# Patient Record
Sex: Male | Born: 1960
Health system: Southern US, Community
[De-identification: ages and names within clinical notes are randomized; demographics above are authoritative.]

## PROBLEM LIST (undated history)

## (undated) DIAGNOSIS — I471 Supraventricular tachycardia: Secondary | ICD-10-CM

## (undated) DIAGNOSIS — Z9889 Other specified postprocedural states: Secondary | ICD-10-CM

## (undated) DIAGNOSIS — D6851 Activated protein C resistance: Secondary | ICD-10-CM

## (undated) DIAGNOSIS — K649 Unspecified hemorrhoids: Secondary | ICD-10-CM

## (undated) DIAGNOSIS — I10 Essential (primary) hypertension: Secondary | ICD-10-CM

## (undated) DIAGNOSIS — C801 Malignant (primary) neoplasm, unspecified: Secondary | ICD-10-CM

## (undated) DIAGNOSIS — I499 Cardiac arrhythmia, unspecified: Secondary | ICD-10-CM

## (undated) DIAGNOSIS — K635 Polyp of colon: Secondary | ICD-10-CM

## (undated) DIAGNOSIS — E785 Hyperlipidemia, unspecified: Secondary | ICD-10-CM

## (undated) DIAGNOSIS — I82409 Acute embolism and thrombosis of unspecified deep veins of unspecified lower extremity: Secondary | ICD-10-CM

## (undated) DIAGNOSIS — I4719 Other supraventricular tachycardia: Secondary | ICD-10-CM

## (undated) DIAGNOSIS — G4730 Sleep apnea, unspecified: Secondary | ICD-10-CM

## (undated) DIAGNOSIS — R9430 Abnormal result of cardiovascular function study, unspecified: Secondary | ICD-10-CM

## (undated) DIAGNOSIS — K219 Gastro-esophageal reflux disease without esophagitis: Secondary | ICD-10-CM

## (undated) DIAGNOSIS — N189 Chronic kidney disease, unspecified: Secondary | ICD-10-CM

## (undated) DIAGNOSIS — D759 Disease of blood and blood-forming organs, unspecified: Secondary | ICD-10-CM

## (undated) DIAGNOSIS — IMO0002 Reserved for concepts with insufficient information to code with codable children: Secondary | ICD-10-CM

## (undated) DIAGNOSIS — R112 Nausea with vomiting, unspecified: Secondary | ICD-10-CM

## (undated) HISTORY — DX: Activated protein C resistance: D68.51

## (undated) HISTORY — PX: NOSE SURGERY: SHX723

## (undated) HISTORY — PX: NECK SURGERY: SHX720

## (undated) HISTORY — PX: KNEE ARTHROSCOPY: SHX127

## (undated) HISTORY — PX: VASECTOMY: SHX75

## (undated) HISTORY — DX: Hyperlipidemia, unspecified: E78.5

## (undated) HISTORY — PX: BICEPS TENDON REPAIR: SHX566

## (undated) HISTORY — DX: Polyp of colon: K63.5

## (undated) HISTORY — PX: SHOULDER SURGERY: SHX246

## (undated) HISTORY — DX: Other supraventricular tachycardia: I47.19

## (undated) HISTORY — PX: FRACTURE SURGERY: SHX138

## (undated) HISTORY — DX: Reserved for concepts with insufficient information to code with codable children: IMO0002

## (undated) HISTORY — DX: Abnormal result of cardiovascular function study, unspecified: R94.30

## (undated) HISTORY — DX: Unspecified hemorrhoids: K64.9

## (undated) HISTORY — PX: EYE SURGERY: SHX253

## (undated) HISTORY — DX: Essential (primary) hypertension: I10

## (undated) HISTORY — DX: Supraventricular tachycardia: I47.1

---

## 1993-04-28 HISTORY — PX: HEMORROIDECTOMY: SUR656

## 2002-05-03 ENCOUNTER — Encounter: Payer: Self-pay | Admitting: Family Medicine

## 2002-05-03 ENCOUNTER — Encounter: Admission: RE | Admit: 2002-05-03 | Discharge: 2002-05-03 | Payer: Self-pay | Admitting: Family Medicine

## 2004-04-23 ENCOUNTER — Ambulatory Visit (HOSPITAL_COMMUNITY): Admission: RE | Admit: 2004-04-23 | Discharge: 2004-04-23 | Payer: Self-pay | Admitting: Orthopedic Surgery

## 2004-04-23 ENCOUNTER — Ambulatory Visit (HOSPITAL_BASED_OUTPATIENT_CLINIC_OR_DEPARTMENT_OTHER): Admission: RE | Admit: 2004-04-23 | Discharge: 2004-04-23 | Payer: Self-pay | Admitting: Orthopedic Surgery

## 2005-12-04 ENCOUNTER — Encounter: Payer: Self-pay | Admitting: Vascular Surgery

## 2005-12-04 ENCOUNTER — Ambulatory Visit (HOSPITAL_COMMUNITY): Admission: RE | Admit: 2005-12-04 | Discharge: 2005-12-04 | Payer: Self-pay | Admitting: Internal Medicine

## 2005-12-24 ENCOUNTER — Ambulatory Visit: Payer: Self-pay | Admitting: Oncology

## 2006-01-30 LAB — BETA-2 GLYCOPROTEIN ANTIBODIES: Beta-2-Glycoprotein I IgA: <4 U/mL (ref <10)

## 2006-01-30 LAB — LUPUS ANTICOAGULANT PANEL
DRVVT: 66.8 secs — ABNORMAL HIGH (ref 31.9–44.2)
PTT Lupus Anticoagulant: 60.7 secs — ABNORMAL HIGH (ref 36.3–48.8)

## 2006-01-30 LAB — CARDIOLIPIN ANTIBODIES, IGG, IGM, IGA
Anticardiolipin IgA: 7 [APL'U] (ref <13)
Anticardiolipin IgM: 9 [MPL'U] (ref <10)

## 2008-11-04 ENCOUNTER — Emergency Department (HOSPITAL_COMMUNITY): Admission: EM | Admit: 2008-11-04 | Discharge: 2008-11-04 | Payer: Self-pay | Admitting: Emergency Medicine

## 2008-11-09 ENCOUNTER — Ambulatory Visit (HOSPITAL_COMMUNITY): Admission: RE | Admit: 2008-11-09 | Discharge: 2008-11-10 | Payer: Self-pay | Admitting: Orthopedic Surgery

## 2010-04-10 ENCOUNTER — Ambulatory Visit
Admission: RE | Admit: 2010-04-10 | Discharge: 2010-04-10 | Payer: Self-pay | Source: Home / Self Care | Attending: Orthopedic Surgery | Admitting: Orthopedic Surgery

## 2010-08-04 LAB — BASIC METABOLIC PANEL
BUN: 16 mg/dL (ref 6–23)
CO2: 27 mEq/L (ref 19–32)
Chloride: 104 mEq/L (ref 96–112)
Creatinine, Ser: 0.93 mg/dL (ref 0.4–1.5)

## 2010-08-04 LAB — CBC
HCT: 43.6 % (ref 39.0–52.0)
HCT: 45 % (ref 39.0–52.0)
Hemoglobin: 14.6 g/dL (ref 13.0–17.0)
MCHC: 33.4 g/dL (ref 30.0–36.0)
MCHC: 33.8 g/dL (ref 30.0–36.0)
MCV: 89.5 fL (ref 78.0–100.0)
Platelets: 142 10*3/uL — ABNORMAL LOW (ref 150–400)
RBC: 5.02 MIL/uL (ref 4.22–5.81)
RDW: 13.4 % (ref 11.5–15.5)

## 2010-08-04 LAB — PROTIME-INR: Prothrombin Time: 13.4 seconds (ref 11.6–15.2)
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
## 2010-09-10 NOTE — Op Note (Signed)
Steven Conley            ACCOUNT NO.:  [id number]   MEDICAL RECORD NO.:  [id number]          PATIENT TYPE:  OIB   LOCATION:  5037                         FACILITY:  [facility]   PHYSICIAN:  Burnard Bunting, M.D.    DATE OF BIRTH:  1961/04/16   DATE OF PROCEDURE:  11/09/2008  DATE OF DISCHARGE:                               OPERATIVE REPORT   PREOPERATIVE DIAGNOSIS:  Right distal biceps rupture.   POSTOPERATIVE DIAGNOSIS:  Right distal biceps rupture.   PROCEDURE:  Right distal biceps rupture repair.   SURGEON:  Burnard Bunting, M.D.  Assist - Vear Clock MD   ANESTHESIA:  General endotracheal.   ESTIMATED BLOOD LOSS:  25 mL.   TOURNIQUET TIME:  19 minutes at 300 mmHg.   INDICATIONS:  Steven Conley is a 50 year old patient with right  distal biceps rupture who presents for operative management after  explanation of risks and benefits.   PROCEDURE IN DETAIL:  The patient was brought to the operative room  where general endotracheal anesthesia was induced.  Perioperative  antibiotics were administered.  Right arm and elbow were prepped and  prescrubbed with chlorhexidine, alcohol, and Betadine, which allowed to  air dry then prepped in DuraPrep solution.  Steven Conley was used to cover the  operative field.  Sterile tourniquet was utilized.  Arm was elevated and  exsanguinated with Esmarch wrap.  Tourniquet was inflated.  Within the  elbow flexion crease, skin and subcutaneous tissues were sharply  divided.  The biceps tendon was visualized.  Care was taken to avoid  injury to the internal branches with musculocutaneous nerve.  The distal  tip of the tendon was trimmed 2-3 mm of tissue.  Following that, two #2  FiberWire sutures were placed and grasped in modified Kessler fashion  with the 4 strands exiting the tip.  At this time, the tunnel on the  biceps tendon was palpated and enlarged manually.  The hand was  pronated, supinated, and tuberosity was palpated.  At this time,  a  curved hemostat was placed around the tuberosity.  The incision was made  avoiding any contacts for visualization with the awl.  The tip was then  pulled through the skin.  Vessel loop was then placed and pulled back up  to the anterior incision.  The skin and subcutaneous tissue, muscle and  fascia was then bluntly divided down until the tuberosity was  visualized.  Care was taken to avoid injury and to avoid excessive  retraction around posterior interosseous nerve.  At this time, using an  osteotome via tuberosity cortex was removed and a trough was created at  both upper curettes.  Thorough irrigation was then performed and removed  bone fragments.  CurvTek was then utilized to drill 2 holes in the  trough exiting the anterior cortex of the radial shaft.  Sutures were  then placed through these holes.  Two strands through each hole.  Sutures were then tied and the tendon pulled nicely into the trough  created on the radial tuberosity.  At this time, the incision was then  thoroughly irrigated with 2 liters  of irrigating solution.  Tourniquet  was released.  Bleeding points were encountered and controlled with  electrocautery.  The patient had excellent elbow range of motion with  appropriate tensioning on the biceps tendon.  At this time, all  incisions were thoroughly irrigated and closed using interrupted #2-0  Vicryl sutures, 3-0 Vicryl, and 3-0 Prolene suture.  Bleeding points  were controlled using electrocautery prior to closure.  Bulky  dressing, splint and brace locked from 90 degrees to full flexion was  placed.  Dr.  Lenny Pastel assistance was required all the time during the  case for retraction of important neurovascular structures and wound  positioning.  His assistance was a medical necessity.      Burnard Bunting, M.D.  Electronically Signed     GSD/MEDQ  D:  11/09/2008  T:  11/10/2008  Job:  [id number]

## 2010-09-13 NOTE — Op Note (Signed)
NAMEESTEFANO, Steven Conley            ACCOUNT NO.:  [id number]   MEDICAL RECORD NO.:  [id number]          PATIENT TYPE:  AMB   LOCATION:  DSC                          FACILITY:  [facility]   PHYSICIAN:  Robert A. Thurston Hole, M.D. DATE OF BIRTH:  08-Sep-1960   DATE OF PROCEDURE:  04/23/2004  DATE OF DISCHARGE:                                 OPERATIVE REPORT   PREOPERATIVE DIAGNOSIS:  Right knee medial meniscal tear with synovitis.   POSTOPERATIVE DIAGNOSIS:  Right knee medial meniscal tear with synovitis.   OPERATION PERFORMED:  1.  Right knee examination under anesthesia followed by arthroscopic partial      medial meniscectomy.  2.  Right knee partial synovectomy.   SURGEON:  Elana Alm. Thurston Hole, M.D.   ANESTHESIA:  General.   OPERATIVE TIME:  30 minutes.   COMPLICATIONS:  None.   INDICATIONS FOR PROCEDURE:  Mr. Volkman is a 50 year old gentleman who  injured his right knee approximately two and a half to three months ago with  a twisting type injury.  Exam and MRI has revealed a medial meniscal tear  and he has failed conservative care and is now to undergo arthroscopy.   DESCRIPTION OF PROCEDURE:  Mr. Wardlow was brought to the operating room on  April 23, 2004 and placed on the operating table in supine position.  After an adequate level of general anesthesia was obtained, his right knee  was examined.  He had full range of motion.  His knee was stable to  ligamentous exam with normal patellar tracking.  The knee was sterilely  injected with 0.25% Marcaine with epinephrine.  His right leg was then  prepped using sterile DuraPrep and draped using sterile technique.  Originally, through an anterolateral portal, the arthroscope with a pump  attached was placed. Through an anteromedial portal and arthroscopic probe  was placed.  On initial inspection of the medial compartment, he had mild  grade 1 and 2 chondromalacia.  Medial meniscus had tearing of the posterior  and medial horn  of which 50% was resected back to a stable rim.  The  intercondylar notch was inspected.  The anterior and posterior cruciate  ligaments were normal.  The lateral compartment showed mild grade 1 and 2  chondromalacia.  The lateral meniscus was normal.  The patellofemoral joint  articular cartilage was normal.  The patella tracked normally.  Medial and  lateral gutter showed moderate synovitis which was debrided.  Otherwise,  they were free of pathology.  After this was done, it was felt that all  pathology had been satisfactorily addressed.  The instruments were removed.  Portals were closed with 3-0 nylon suture and injected with 0.25% Marcaine  with epinephrine and 4 mg of morphine.  Sterile dressings were applied and  the patient awakened and taken to recovery room in stable condition.   FOLLOW UP:  Mr. Cosman will be followed as an outpatient on Percocet and  Naprosyn.  See him back in the office in a week for suture removal and  follow-up.       RAW/MEDQ  D:  04/23/2004  T:  04/23/2004  Job:  [id number]

## 2011-01-27 ENCOUNTER — Ambulatory Visit (INDEPENDENT_AMBULATORY_CARE_PROVIDER_SITE_OTHER): Payer: BC Managed Care – PPO | Admitting: General Surgery

## 2011-01-27 ENCOUNTER — Encounter (INDEPENDENT_AMBULATORY_CARE_PROVIDER_SITE_OTHER): Payer: Self-pay | Admitting: General Surgery

## 2011-01-27 VITALS — BP 146/84 | HR 68 | Temp 97.8°F | Resp 16 | Ht 69.5 in | Wt 201.6 lb

## 2011-01-27 DIAGNOSIS — K648 Other hemorrhoids: Secondary | ICD-10-CM

## 2011-01-27 NOTE — Progress Notes (Signed)
Subjective:     Patient ID: Steven Conley, male   DOB: [birth year], 50 y.o.   MRN: [id number]  HPI He has a history of what sounds like a 3 column hemorrhoidectomy for grade 3 hemorrhoids in 1995. He has had some recurrence of some of the symptoms with mostly just some soreness and discomfort around his anus when he is having a bowel movement. He notes some occasional bright red blood per rectum also. He is recently been seen by Dr. Conni Elliot. He has undergone a colonoscopy in August of 2012 which showed some external hemorrhoids a sessile polyp in the sigmoid colon but was otherwise normal at that point. He also went in EGD at that same time which was fairly normal as well. His pathology showed no evidence of any Barrett's on his EGD and a tubular adenoma in the sigmoid colon. He would like to have his hemorrhoids evaluated at this point is due to the fact that he had a lot of trouble before and wants to make sure that he doesn't get to that point again. He does take chronic warfarin for a history of DVT and factor V Leiden deficiency.  Review of Systems  HENT: Positive for sore throat.   Cardiovascular: Positive for leg swelling (secondary to prior dvt).  Gastrointestinal: Positive for diarrhea, anal bleeding and rectal pain.  All other systems reviewed and are negative.       Objective:   Physical Exam  Constitutional: He appears well-developed and well-nourished.  Genitourinary: Rectal exam shows internal hemorrhoid (left lateral enlarged internal hemorrhoid complex). Rectal exam shows no external hemorrhoid and no fissure. Prostate is not enlarged and not tender.       Assessment:     Internal hemorrhoids    Plan:        We discussed conservative measures for hemorrhoids today. He does have what sounds like some irritable bowel symptoms. I again stressed that he should be evaluated for Dr. Dulce Sellar about that. I think that these will likely recurred mostly symptoms given her little bit  better control. We did proceed with an anoscopy today due to his symptoms. He really had one hemorrhoidal complex in the left lateral position that was causing him trouble. I did apply 3 bands to this hemorrhoidal complex and it looked like this obliterated the complex. He had not taken his warfarin in 48 hours and there really was no bleeding during the procedure all either. I believe this will take care of the one symptomatic hemorrhoid that he has and we can just proceed with conservative measures. I asked him to followup with me in 6 weeks but if he is fine then can follow up with Dr. Dulce Sellar.

## 2011-03-03 ENCOUNTER — Encounter (INDEPENDENT_AMBULATORY_CARE_PROVIDER_SITE_OTHER): Payer: BC Managed Care – PPO | Admitting: General Surgery

## 2011-03-10 ENCOUNTER — Encounter (INDEPENDENT_AMBULATORY_CARE_PROVIDER_SITE_OTHER): Payer: BC Managed Care – PPO | Admitting: General Surgery

## 2011-03-24 ENCOUNTER — Encounter (INDEPENDENT_AMBULATORY_CARE_PROVIDER_SITE_OTHER): Payer: Self-pay | Admitting: General Surgery

## 2011-03-24 ENCOUNTER — Ambulatory Visit (INDEPENDENT_AMBULATORY_CARE_PROVIDER_SITE_OTHER): Payer: BC Managed Care – PPO | Admitting: General Surgery

## 2011-03-24 VITALS — BP 130/88 | HR 64 | Temp 98.2°F | Resp 18 | Ht 69.5 in | Wt 199.5 lb

## 2011-03-24 DIAGNOSIS — K648 Other hemorrhoids: Secondary | ICD-10-CM

## 2011-03-24 NOTE — Progress Notes (Signed)
Subjective:     Patient ID: Steven Conley, male   DOB: [birth year], 50 y.o.   MRN: [id number]  HPI 41 yom with history of internal hemorrhoids s/p bands about 8 weeks ago now. His symptoms are improved now but he is still having some occasional BRBPR.  He is otherwise without complaint today.  Review of Systems     Objective:   Physical Exam Small external tags, anoscopy with small left lateral hemorrhoids s/p banding    Assessment:     Internal hemorrhoids     Plan:     We discussed anoscopy again due to some continued symptoms.  He had some mild internal hemorrhoids that did bleed on evaluation today.  I applied two bands again today.  We had discussed risk/benefits again prior to beginning.  This was without complication.  We also discussed continuing water intake, fiber and toilet habits.  We may need to do again but he will call me if he continues to have symptoms or has any worsening.

## 2011-03-24 NOTE — Patient Instructions (Signed)
Hemorrhoid Banding Hemorrhoids are veins in the anus and lower rectum that become enlarged. The most common symptoms are rectal bleeding, itching, and sometimes pain. Hemorrhoids might come out with straining or having a bowel movement, and they can sometimes be pushed back in. There are internal and external hemorrhoids. Only internal hemorrhoids can be treated with banding. In this procedure, a rubber band is placed near the hemorrhoid tissue, cutting off the blood supply. This procedure prevents the hemorrhoids from slipping down. LET YOUR CAREGIVER KNOW ABOUT: All medicines you are taking, especially blood thinners such as aspirin and coumadin.  RISKS AND COMPLICATIONS This is not a painful procedure, but if you do have intense pain immediately let your surgeon know because the band may need to be removed. You may have some mild pain or discomfort in the first 2 days or so after treatment. Sometimes there may be delayed bleeding in the first week after treatment.  BEFORE THE PROCEDURE  There is no special preparation needed before banding. Your surgeon may have you do an enema prior to the procedure. You will go home the same day.  HOME CARE INSTRUCTIONS   Your surgeon might instruct you to do sitz baths as needed if you have discomfort or after a bowel movement.   You may be instructed to use fiber supplements.  SEEK MEDICAL CARE IF:  You have an increase in pain.   Your pain does not get better.  SEEK IMMEDIATE MEDICAL CARE IF:  You have intense pain.   Fever greater than 100.5 F (38.1 C).   Bleeding that does not stop, or pus from the anus.  Document Released: 02/09/2009 Document Revised: 12/25/2010 Document Reviewed: 02/09/2009 [employer] Patient Information 2012 [employer]. 

## 2011-11-12 ENCOUNTER — Other Ambulatory Visit: Payer: Self-pay | Admitting: Family Medicine

## 2011-11-12 DIAGNOSIS — M545 Low back pain, unspecified: Secondary | ICD-10-CM

## 2011-11-21 ENCOUNTER — Ambulatory Visit
Admission: RE | Admit: 2011-11-21 | Discharge: 2011-11-21 | Disposition: A | Discharge status: Home / Self Care | Payer: BC Managed Care – PPO | Source: Ambulatory Visit | Attending: Family Medicine | Admitting: Family Medicine

## 2011-11-21 DIAGNOSIS — M545 Low back pain, unspecified: Secondary | ICD-10-CM

## 2012-02-06 ENCOUNTER — Other Ambulatory Visit: Payer: Self-pay | Admitting: Family Medicine

## 2012-02-06 DIAGNOSIS — M542 Cervicalgia: Secondary | ICD-10-CM

## 2012-02-06 DIAGNOSIS — M549 Dorsalgia, unspecified: Secondary | ICD-10-CM

## 2012-02-17 ENCOUNTER — Ambulatory Visit
Admission: RE | Admit: 2012-02-17 | Discharge: 2012-02-17 | Disposition: A | Discharge status: Home / Self Care | Payer: BC Managed Care – PPO | Source: Ambulatory Visit | Attending: Family Medicine | Admitting: Family Medicine

## 2012-02-17 DIAGNOSIS — M549 Dorsalgia, unspecified: Secondary | ICD-10-CM

## 2012-02-17 DIAGNOSIS — M542 Cervicalgia: Secondary | ICD-10-CM

## 2012-03-09 ENCOUNTER — Encounter (HOSPITAL_COMMUNITY): Payer: Self-pay | Admitting: *Deleted

## 2012-03-09 ENCOUNTER — Encounter (HOSPITAL_COMMUNITY): Payer: Self-pay | Admitting: Pharmacy Technician

## 2012-03-09 NOTE — Progress Notes (Signed)
Pt. Instructed with teach back method used.

## 2012-03-10 ENCOUNTER — Ambulatory Visit (HOSPITAL_COMMUNITY)
Admission: RE | Admit: 2012-03-10 | Discharge: 2012-03-10 | Disposition: A | Discharge status: Home / Self Care | Payer: BC Managed Care – PPO | Source: Ambulatory Visit | Attending: Gastroenterology | Admitting: Gastroenterology

## 2012-03-10 ENCOUNTER — Encounter (HOSPITAL_COMMUNITY): Payer: Self-pay | Admitting: Anesthesiology

## 2012-03-10 ENCOUNTER — Ambulatory Visit (HOSPITAL_COMMUNITY): Payer: BC Managed Care – PPO | Admitting: Anesthesiology

## 2012-03-10 ENCOUNTER — Encounter (HOSPITAL_COMMUNITY)
Admission: RE | Disposition: A | Discharge status: Home / Self Care | Payer: Self-pay | Source: Ambulatory Visit | Attending: Gastroenterology

## 2012-03-10 DIAGNOSIS — I10 Essential (primary) hypertension: Secondary | ICD-10-CM | POA: Insufficient documentation

## 2012-03-10 DIAGNOSIS — K219 Gastro-esophageal reflux disease without esophagitis: Secondary | ICD-10-CM | POA: Insufficient documentation

## 2012-03-10 HISTORY — PX: ESOPHAGOGASTRODUODENOSCOPY (EGD) WITH PROPOFOL: SHX5813

## 2012-03-10 HISTORY — PX: BRAVO PH STUDY: SHX5421

## 2012-03-10 HISTORY — DX: Nausea with vomiting, unspecified: R11.2

## 2012-03-10 HISTORY — DX: Other specified postprocedural states: Z98.890

## 2012-03-10 SURGERY — ESOPHAGOGASTRODUODENOSCOPY (EGD) WITH PROPOFOL
Anesthesia: Monitor Anesthesia Care

## 2012-03-10 MED ORDER — PROPOFOL 10 MG/ML IV BOLUS
INTRAVENOUS | Status: DC | PRN
  Administered 2012-03-10: 20 mg via INTRAVENOUS
  Administered 2012-03-10: 25 mg via INTRAVENOUS
  Administered 2012-03-10: 50 mg via INTRAVENOUS
  Administered 2012-03-10: 30 mg via INTRAVENOUS

## 2012-03-10 MED ORDER — BUTAMBEN-TETRACAINE-BENZOCAINE 2-2-14 % EX AERO
INHALATION_SPRAY | CUTANEOUS | Status: DC | PRN
  Administered 2012-03-10: 2 via TOPICAL

## 2012-03-10 MED ORDER — MIDAZOLAM HCL 5 MG/5ML IJ SOLN
INTRAMUSCULAR | Status: DC | PRN
  Administered 2012-03-10: 2 mg via INTRAVENOUS

## 2012-03-10 MED ORDER — LACTATED RINGERS IV SOLN
INTRAVENOUS | Status: DC | PRN
  Administered 2012-03-10: 09:00:00 via INTRAVENOUS

## 2012-03-10 MED ORDER — SODIUM CHLORIDE 0.9 % IV SOLN: INTRAVENOUS | Status: DC

## 2012-03-10 MED ORDER — FENTANYL CITRATE 0.05 MG/ML IJ SOLN
INTRAMUSCULAR | Status: DC | PRN
  Administered 2012-03-10: 50 ug via INTRAVENOUS

## 2012-03-10 SURGICAL SUPPLY — 15 items

## 2012-03-10 NOTE — Op Note (Signed)
[facility] [address] [place], [address]   ENDOSCOPY PROCEDURE REPORT  PATIENT: Steven, Conley  MR#: [id number] BIRTHDATE: [birth year] , 51  yrs. old GENDER: Male ENDOSCOPIST: Willis Modena, MD REFERRED BY:  Kirby Funk, M.D. PROCEDURE DATE:  03/10/2012 PROCEDURE:  EGD w/ Bravo capsule placement (ON PPI/H2RB treatment) ASA CLASS:     Class II INDICATIONS:  GERD refractory to medical therapy. MEDICATIONS: MAC sedation, administered by CRNA TOPICAL ANESTHETIC: Cetacaine Spray  DESCRIPTION OF PROCEDURE: After the risks benefits and alternatives of the procedure were thoroughly explained, informed consent was obtained.  The Pentax Gastroscope [id number] endoscope was introduced through the mouth and advanced to the second portion of the duodenum. Without limitations.  The instrument was slowly withdrawn as the mucosa was fully examined.    Findings:  Modestly irregular Z-line (previously seen, previously biopsied without evidence of Barrett's mucosa); otherwise normal esophagus; GE junction 40 cm from the incisors.  Stomach, pylorus, and duodenum to the second portion was normal.  After completion of diagnostic exam, as above, Bravo capsule was placed employing standard protocol.         Retroflexion into cardia was normal. Second-look endoscopy confirmed appropriate positioning of the Bravo capsule.          The scope was then withdrawn from the patient and the procedure completed.  ENDOSCOPIC IMPRESSION:     As above.  Successful placement of Bravo capsule.  RECOMMENDATIONS:     1.  Watch for potential complications of procedure. 2.  OK to resume warfarin today. 3.  Await 48-hour Bravo readings. 4.  Next step in management is pending Bravo results; if refractory GERD confirmed, especially with good symptom correlation, would consider surgical referral for consideration of Nissen fundoplication. 5.  Office visit [facility] to review Bravo results in  2-3 weeks.   eSigned:  Willis Modena, MD 03/10/2012 9:50 AM   CC:

## 2012-03-10 NOTE — Transfer of Care (Signed)
Immediate Anesthesia Transfer of Care Note  Patient: Steven Conley  Procedure(s) Performed: Procedure(s) (LRB) with comments: ESOPHAGOGASTRODUODENOSCOPY (EGD) WITH PROPOFOL (N/A) BRAVO PH STUDY (N/A)  Patient Location: PACU  Anesthesia Type:MAC  Level of Consciousness: awake, sedated and patient cooperative  Airway & Oxygen Therapy: Patient Spontanous Breathing and Patient connected to nasal cannula oxygen  Post-op Assessment: Report given to PACU RN and Post -op Vital signs reviewed and stable  Post vital signs: Reviewed and stable  Complications: No apparent anesthesia complications

## 2012-03-10 NOTE — H&P (Signed)
Patient interval history reviewed.  Patient examined again.  There has been no change from documented H/P dated 02/18/12 (scanned into chart from our office) except as documented above.  Assessment:  1.  GERD, refractory to medical therapy.  Plan:  1.  Upper endoscopy with pH capsule (Bravo) placement. 2.  Risks (bleeding, infection, bowel perforation that could require surgery, sedation-related changes in cardiopulmonary systems), benefits (identification and possible treatment of source of symptoms, exclusion of certain causes of symptoms), and alternatives (watchful waiting, radiographic imaging studies, empiric medical treatment) of upper endoscopy (EGD) were explained to patient/wife in detail and patient wishes to proceed.

## 2012-03-10 NOTE — Anesthesia Preprocedure Evaluation (Addendum)
Anesthesia Evaluation  Patient identified by MRN, date of birth, ID band Patient awake    Reviewed: Allergy & Precautions, H&P , NPO status , Patient's Chart, lab work & pertinent test results  History of Anesthesia Complications (+) PONV  Airway Mallampati: II TM Distance: >3 FB Neck ROM: Full    Dental No notable dental hx.    Pulmonary neg pulmonary ROS,  breath sounds clear to auscultation  Pulmonary exam normal       Cardiovascular hypertension, Pt. on medications Rhythm:Regular Rate:Normal  Factor V leiden mutation.   Neuro/Psych negative neurological ROS  negative psych ROS   GI/Hepatic Neg liver ROS, GERD-  Medicated,  Endo/Other  negative endocrine ROS  Renal/GU negative Renal ROS  negative genitourinary   Musculoskeletal negative musculoskeletal ROS (+)   Abdominal   Peds negative pediatric ROS (+)  Hematology negative hematology ROS (+)   Anesthesia Other Findings   Reproductive/Obstetrics negative OB ROS                           Anesthesia Physical Anesthesia Plan  ASA: II  Anesthesia Plan: MAC   Post-op Pain Management:    Induction: Intravenous  Airway Management Planned:   Additional Equipment:   Intra-op Plan:   Post-operative Plan:   Informed Consent: I have reviewed the patients History and Physical, chart, labs and discussed the procedure including the risks, benefits and alternatives for the proposed anesthesia with the patient or authorized representative who has indicated his/her understanding and acceptance.   Dental advisory given  Plan Discussed with: CRNA  Anesthesia Plan Comments:        Anesthesia Quick Evaluation

## 2012-03-10 NOTE — Anesthesia Postprocedure Evaluation (Signed)
  Anesthesia Post-op Note  Patient: Steven Conley  Procedure(s) Performed: Procedure(s) (LRB): ESOPHAGOGASTRODUODENOSCOPY (EGD) WITH PROPOFOL (N/A) BRAVO PH STUDY (N/A)  Patient Location: PACU  Anesthesia Type: MAC  Level of Consciousness: awake and alert   Airway and Oxygen Therapy: Patient Spontanous Breathing  Post-op Pain: mild  Post-op Assessment: Post-op Vital signs reviewed, Patient's Cardiovascular Status Stable, Respiratory Function Stable, Patent Airway and No signs of Nausea or vomiting  Post-op Vital Signs: stable  Complications: No apparent anesthesia complications

## 2012-03-11 ENCOUNTER — Encounter (HOSPITAL_COMMUNITY): Payer: Self-pay | Admitting: Gastroenterology

## 2012-09-07 ENCOUNTER — Encounter (HOSPITAL_COMMUNITY): Payer: Self-pay | Admitting: Emergency Medicine

## 2012-09-07 ENCOUNTER — Inpatient Hospital Stay (HOSPITAL_COMMUNITY)
Admission: EM | Admit: 2012-09-07 | Discharge: 2012-09-09 | DRG: 316 | Disposition: A | Discharge status: Home / Self Care | Payer: BC Managed Care – PPO | Attending: Internal Medicine | Admitting: Internal Medicine

## 2012-09-07 ENCOUNTER — Inpatient Hospital Stay (HOSPITAL_COMMUNITY): Payer: BC Managed Care – PPO

## 2012-09-07 ENCOUNTER — Emergency Department (HOSPITAL_COMMUNITY): Payer: BC Managed Care – PPO

## 2012-09-07 DIAGNOSIS — D6851 Activated protein C resistance: Secondary | ICD-10-CM | POA: Diagnosis present

## 2012-09-07 DIAGNOSIS — Z86718 Personal history of other venous thrombosis and embolism: Secondary | ICD-10-CM

## 2012-09-07 DIAGNOSIS — N179 Acute kidney failure, unspecified: Principal | ICD-10-CM

## 2012-09-07 DIAGNOSIS — R0600 Dyspnea, unspecified: Secondary | ICD-10-CM

## 2012-09-07 DIAGNOSIS — Z7901 Long term (current) use of anticoagulants: Secondary | ICD-10-CM

## 2012-09-07 DIAGNOSIS — E871 Hypo-osmolality and hyponatremia: Secondary | ICD-10-CM | POA: Diagnosis present

## 2012-09-07 DIAGNOSIS — R252 Cramp and spasm: Secondary | ICD-10-CM | POA: Diagnosis present

## 2012-09-07 DIAGNOSIS — K219 Gastro-esophageal reflux disease without esophagitis: Secondary | ICD-10-CM | POA: Diagnosis present

## 2012-09-07 DIAGNOSIS — D6859 Other primary thrombophilia: Secondary | ICD-10-CM | POA: Diagnosis present

## 2012-09-07 DIAGNOSIS — R0602 Shortness of breath: Secondary | ICD-10-CM

## 2012-09-07 DIAGNOSIS — I10 Essential (primary) hypertension: Secondary | ICD-10-CM | POA: Diagnosis present

## 2012-09-07 DIAGNOSIS — E785 Hyperlipidemia, unspecified: Secondary | ICD-10-CM | POA: Diagnosis present

## 2012-09-07 DIAGNOSIS — D72829 Elevated white blood cell count, unspecified: Secondary | ICD-10-CM | POA: Diagnosis present

## 2012-09-07 DIAGNOSIS — R0789 Other chest pain: Secondary | ICD-10-CM

## 2012-09-07 HISTORY — DX: Acute embolism and thrombosis of unspecified deep veins of unspecified lower extremity: I82.409

## 2012-09-07 LAB — URINALYSIS, ROUTINE W REFLEX MICROSCOPIC
Glucose, UA: NEGATIVE mg/dL
Hgb urine dipstick: NEGATIVE
Nitrite: NEGATIVE
Specific Gravity, Urine: 1.028 (ref 1.005–1.030)
pH: 5 (ref 5.0–8.0)

## 2012-09-07 LAB — CBC
HCT: 48.8 % (ref 39.0–52.0)
Hemoglobin: 17.1 g/dL — ABNORMAL HIGH (ref 13.0–17.0)
MCHC: 35 g/dL (ref 30.0–36.0)
MCV: 86.8 fL (ref 78.0–100.0)
WBC: 15.4 10*3/uL — ABNORMAL HIGH (ref 4.0–10.5)

## 2012-09-07 LAB — TROPONIN I: Troponin I: <0.3 ng/mL (ref <0.30)

## 2012-09-07 LAB — POCT I-STAT 3, ART BLOOD GAS (G3+)
TCO2: 24 mmol/L (ref 0–100)
pCO2 arterial: 39 mmHg (ref 35.0–45.0)
pH, Arterial: 7.369 (ref 7.350–7.450)
pO2, Arterial: 102 mmHg — ABNORMAL HIGH (ref 80.0–100.0)

## 2012-09-07 LAB — APTT: aPTT: 37 seconds (ref 24–37)

## 2012-09-07 LAB — BASIC METABOLIC PANEL
BUN: 33 mg/dL — ABNORMAL HIGH (ref 6–23)
Chloride: 93 mEq/L — ABNORMAL LOW (ref 96–112)
Creatinine, Ser: 2.81 mg/dL — ABNORMAL HIGH (ref 0.50–1.35)
Glucose, Bld: 145 mg/dL — ABNORMAL HIGH (ref 70–99)
Potassium: 4.1 mEq/L (ref 3.5–5.1)

## 2012-09-07 LAB — HEPATIC FUNCTION PANEL
ALT: 93 U/L — ABNORMAL HIGH (ref 0–53)
AST: 38 U/L — ABNORMAL HIGH (ref 0–37)
Albumin: 4.7 g/dL (ref 3.5–5.2)
Total Protein: 8.3 g/dL (ref 6.0–8.3)

## 2012-09-07 LAB — CK: Total CK: 278 U/L — ABNORMAL HIGH (ref 7–232)

## 2012-09-07 MED ORDER — TESTOSTERONE 10 MG/ACT (2%) TD GEL
40.0000 mg | Freq: Every day | TRANSDERMAL | Status: DC
  Administered 2012-09-08: 40 mg via TOPICAL

## 2012-09-07 MED ORDER — ACETAMINOPHEN 650 MG RE SUPP: 650.0000 mg | Freq: Four times a day (QID) | RECTAL | Status: DC | PRN

## 2012-09-07 MED ORDER — ATORVASTATIN CALCIUM 10 MG PO TABS
10.0000 mg | ORAL_TABLET | Freq: Every day | ORAL | Status: DC
  Filled 2012-09-07 (×2): qty 1

## 2012-09-07 MED ORDER — SODIUM CHLORIDE 0.9 % IV BOLUS (SEPSIS)
1000.0000 mL | Freq: Once | INTRAVENOUS | Status: AC
  Administered 2012-09-07: 1000 mL via INTRAVENOUS

## 2012-09-07 MED ORDER — SODIUM CHLORIDE 0.9 % IV SOLN: INTRAVENOUS | Status: DC

## 2012-09-07 MED ORDER — MORPHINE SULFATE 2 MG/ML IJ SOLN: 1.0000 mg | INTRAMUSCULAR | Status: DC | PRN

## 2012-09-07 MED ORDER — SODIUM CHLORIDE 0.9 % IJ SOLN: 3.0000 mL | Freq: Two times a day (BID) | INTRAMUSCULAR | Status: DC

## 2012-09-07 MED ORDER — ENOXAPARIN SODIUM 100 MG/ML ~~LOC~~ SOLN
1.0000 mg/kg | Freq: Two times a day (BID) | SUBCUTANEOUS | Status: DC
  Administered 2012-09-07: 90 mg via SUBCUTANEOUS
  Filled 2012-09-07 (×4): qty 1

## 2012-09-07 MED ORDER — TETRAHYDROZOLINE HCL 0.05 % OP SOLN: 2.0000 [drp] | Freq: Four times a day (QID) | OPHTHALMIC | Status: DC | PRN

## 2012-09-07 MED ORDER — TECHNETIUM TO 99M ALBUMIN AGGREGATED
5.0000 | Freq: Once | INTRAVENOUS | Status: AC | PRN
  Administered 2012-09-07: 5 via INTRAVENOUS

## 2012-09-07 MED ORDER — ACETAMINOPHEN 325 MG PO TABS
650.0000 mg | ORAL_TABLET | Freq: Four times a day (QID) | ORAL | Status: DC | PRN
Start: 2012-09-07 — End: 2012-09-09

## 2012-09-07 MED ORDER — HYDRALAZINE HCL 20 MG/ML IJ SOLN: 10.0000 mg | Freq: Four times a day (QID) | INTRAMUSCULAR | Status: DC | PRN

## 2012-09-07 MED ORDER — FAMOTIDINE 40 MG PO TABS
40.0000 mg | ORAL_TABLET | Freq: Two times a day (BID) | ORAL | Status: DC
  Administered 2012-09-08 (×2): 40 mg via ORAL
  Filled 2012-09-07 (×5): qty 1

## 2012-09-07 MED ORDER — TECHNETIUM TC 99M DIETHYLENETRIAME-PENTAACETIC ACID: 40.0000 | Freq: Once | INTRAVENOUS | Status: AC | PRN

## 2012-09-07 MED ORDER — WARFARIN SODIUM 2.5 MG PO TABS
2.5000 mg | ORAL_TABLET | Freq: Once | ORAL | Status: AC
  Administered 2012-09-07: 2.5 mg via ORAL
  Filled 2012-09-07: qty 1

## 2012-09-07 MED ORDER — WARFARIN - PHARMACIST DOSING INPATIENT: Freq: Every day | Status: DC

## 2012-09-07 NOTE — ED Notes (Signed)
Anne(wife)-[phone]

## 2012-09-07 NOTE — Progress Notes (Signed)
ANTICOAGULATION CONSULT NOTE - Initial Consult  Pharmacy Consult for Lovenox/Warfarin Indication: Factor V Leiden, h/o DVT  Allergies  Allergen Reactions  . Sulfa Drugs Cross Reactors Hives    Patient-Reported Measurements:   Ht: 5 ft 9 in Wt: 193 lbs (87.5 kg)  Vital Signs: Temp: 98.7 F (37.1 C) (05/13 1853) Temp src: Oral (05/13 1853) BP: 128/71 mmHg (05/13 1945) Pulse Rate: 69 (05/13 1945)  Labs:  Recent Labs  09/07/12 1710 09/07/12 1711  HGB 17.1*  --   HCT 48.8  --   PLT 172  --   APTT 37  --   LABPROT 19.9*  --   INR 1.76*  --   CREATININE 2.81*  --   CKTOTAL  --  278*    The CrCl is unknown because both a height and weight (above a minimum accepted value) are required for this calculation.   Medical History: Past Medical History  Diagnosis Date  . Homozygous Factor V Leiden mutation     Dr. Shela Commons. Vanna Scotland  . Hyperlipidemia   . Hypertension   . Hemorrhoid   . Colon polyp   . PONV (postoperative nausea and vomiting)     no problems with Propofol use  . DVT (deep venous thrombosis)     Medications:  Warfarin PTA Dose: Pt states 5mg  on Mon/Fri and 2.5mg  all other days, verified multiple times with patient.  Assessment: 52 y/o M with increasing lightheadedness and SOB. On warfarin PTA for h/o DVT. Recently off warfarin for cervical steroid injection last Wednesday (re-started with warfarin/lovenox bridge after this and finished the lovenox Sunday). INR today is 1.76. Pt appears to be in acute renal failure with Scr 2.81. CBC good. No bleeding per pt. Pt had already taken 2.5mg  of warfarin today. Will provide an extra 2.5mg  of warfarin this evening.   Goal of Therapy:  INR 2-3 Monitor platelets by anticoagulation protocol: Yes   Plan:  -Start lovenox 90 mg [place] q12h -Warfarin 2.5 mg PO x 1 tonight, then likely dose based on home regimen starting tomorrow -Daily PT/INR -Monitor for bleeding -F/U for DC lovenox when INR >2  Thank you for  the consult,   Abran Duke, PharmD Clinical Pharmacist Phone: [phone] Pager: [phone] 09/07/2012 9:00 PM

## 2012-09-07 NOTE — H&P (Signed)
[facility] History and Physical  Steven Conley ZOX:[id number] DOB: [birth year] DOA: 09/07/2012  Referring physician: Dr. Ranae Palms PCP: Lillia Mountain, MD   Chief Complaint: SOB, chest discomfort   HPI: Steven Conley is a 52 y.o. male with pmh significant for HTN, HLD, factor V mutation, hx of DVT and GERD; came to ED with complaints of chest discomfort and SOB. Patient reports he recently stop coumadin for spinal injection and that he has been working in the outdoor space and developed lightheadedness, bodyache/muscle cramps and SOB. Patient also reports some right side chest pain, worse with deep breaths. Denies HA, fever, chills, cough, dysuria, abd pain or any other complaints. In the ED was found tachycardic, with elevated WBC's, hyponatremia, elevated CR/BUN and borderline low O2 sat on RA. INR was 1.76  TR[facility] called to admit to r/o PE and for further evaluation and treatment of ARF.  Review of Systems:  Negative except as mentioned on hpi  Past Medical History  Diagnosis Date  . Homozygous Factor V Leiden mutation     Dr. Shela Commons. Vanna Scotland  . Hyperlipidemia   . Hypertension   . Hemorrhoid   . Colon polyp   . PONV (postoperative nausea and vomiting)     no problems with Propofol use  . DVT (deep venous thrombosis)    Past Surgical History  Procedure Laterality Date  . Nose surgery  1972, 1985  . Fracture surgery      left ankle  . Knee arthroscopy      right  . Shoulder surgery      right  . Biceps tendon repair      right  . Vasectomy    . Hemorroidectomy  1995  . Esophagogastroduodenoscopy (egd) with propofol  03/10/2012    Procedure: ESOPHAGOGASTRODUODENOSCOPY (EGD) WITH PROPOFOL;  Surgeon: Willis Modena, MD;  Location: [facility];  Service: Endoscopy;  Laterality: N/A;  . Bravo ph study  03/10/2012    Procedure: BRAVO PH STUDY;  Surgeon: Willis Modena, MD;  Location: [facility];  Service: Endoscopy;  Laterality: N/A;   Social History:   reports that he has never smoked. He has never used smokeless tobacco. He reports that  drinks alcohol. He reports that he does not use illicit drugs. Lives at home with wife and kids; no assistance needed with ADL's  Allergies  Allergen Reactions  . Sulfa Drugs Cross Reactors Hives    Family History  Problem Relation Age of Onset  . Cancer Mother     breast  . Cancer Sister     breast  . Cancer Brother     stomach  . Cancer Maternal Aunt     breast    Prior to Admission medications   Medication Sig Start Date End Date Taking? Authorizing Provider  atorvastatin (LIPITOR) 10 MG tablet Take 10 mg by mouth every morning.    Yes Historical Provider, MD  calcium carbonate (TUMS - DOSED IN MG ELEMENTAL CALCIUM) 500 MG chewable tablet Chew 1-4 tablets by mouth daily as needed for heartburn.    Yes Historical Provider, MD  Cyanocobalamin (VITAMIN B 12 PO) Take 1 tablet by mouth daily.   Yes Historical Provider, MD  famotidine (PEPCID) 20 MG tablet Take 20 mg by mouth daily.    Yes Historical Provider, MD  lisinopril (PRINIVIL,ZESTRIL) 20 MG tablet Take 20 mg by mouth every morning.    Yes Historical Provider, MD  omeprazole (PRILOSEC) 40 MG capsule Take 40 mg by mouth daily.   Yes Historical  Provider, MD  Testosterone (FORTESTA TD) Place 40 mg onto the skin daily. Place on upper thigh   Yes Historical Provider, MD  Tetrahydrozoline HCl (VISINE OP) Apply 1-2 drops to eye 4 (four) times daily as needed (dry eyes).   Yes Historical Provider, MD  warfarin (COUMADIN) 5 MG tablet Take 2.5-5 mg by mouth every morning. Mon & Fri-0.5 half tab (2.5 mg total); All other days-1 tab (5 mg total )   Yes Historical Provider, MD   Physical Exam: Filed Vitals:   09/07/12 1633 09/07/12 1700 09/07/12 1853  BP: 120/86 121/71 110/68  Pulse: 97 100 73  Temp: 97.7 F (36.5 C)  98.7 F (37.1 C)  TempSrc: Oral  Oral  Resp: 16 21 16   SpO2: 96% 96% 98%     General:  No fever, mild discomfort with deep  inspiration, able to speak in full sentences.  Eyes: PERRL, no icterus, no nystagmus, EOMI  ENT: no erythema or exudates inside his mouth, good dentition, no drainage out of ears or nostrils  Neck: supple, no thyromegaly, no bruits  Cardiovascular: tachycardia, no rubs, no gallops, no murmurs  Respiratory: good air movement, no wheezing; reports some pleurisy with deep inspiration  Abdomen: soft, NT, ND, positive BS  Skin: no rash, no open wounds  Musculoskeletal: trace edema bilaterally, no joint swelling  Psychiatric: normal mood  Neurologic: AAOX3, CN intact, MS 5/5, normal sensory and no motor deficit.  Labs on Admission:  Basic Metabolic Panel:  Recent Labs Lab 09/07/12 1710  NA 133*  K 4.1  CL 93*  CO2 19  GLUCOSE 145*  BUN 33*  CREATININE 2.81*  CALCIUM 10.4   Liver Function Tests:  Recent Labs Lab 09/07/12 1711  AST 38*  ALT 93*  ALKPHOS 56  BILITOT 0.6  PROT 8.3  ALBUMIN 4.7   CBC:  Recent Labs Lab 09/07/12 1710  WBC 15.4*  HGB 17.1*  HCT 48.8  MCV 86.8  PLT 172   Cardiac Enzymes:  Recent Labs Lab 09/07/12 1711  CKTOTAL 278*    Radiological Exams on Admission: Dg Chest 2 View  09/07/2012  *RADIOLOGY REPORT*  Clinical Data: Shortness of breath.  Dizziness and hypertension.  CHEST - 2 VIEW  Comparison: 11/09/2008.  Findings: Normal heart size with clear lung fields.  No bony abnormality.  No pulmonary nodules, effusion, or pneumothorax.  IMPRESSION: No active cardiopulmonary disease.  No change from priors.   Original Report Authenticated By: Davonna Belling, M.D.     EKG: Rate:99  Rhythm: normal sinus rhythm  QRS Axis: normal  Intervals: normal  ST/T Wave abnormalities: normal  Conduction Disutrbances:none  Narrative Interpretation:  Old EKG Reviewed: none available   Assessment/Plan 1-SOB and chest discomfort: with concerns for PE, given hx of DVT, factor V leiden mutation and subtherapeutic INR. -will admit to  telemetry -oxygen supplemetation -V/Q scan -will ccheck CE'z -LE's dopplers and 2-D echo.  2-ARF (acute renal failure): most likely secondary to dehydration, continuation of lisinopril in this circumstances and recent steroids injection. -check renal US -check UA -IVF's resuscitation  -will hold nephrotoxic agent -BMET in am  3-Factor V Leiden mutation: continue coumadin with lovenox bridge as there is concerns for PE and INR subtherapeutic.  4-HTN (hypertension): stable. Will hold lisinopril for now and provide fluid resuscitation. Will follow VS and will use PRN hydralazine PRN  5-GERD (gastroesophageal reflux disease):continue famotidine.  6-HLD (hyperlipidemia):cotinue statins.  7-History of DVT of lower extremity: as mentioned above; continue coumadin.  8-Leukocytosis: most likely  due to recent steroids use; also due to dehydration. No fever, no cough, clean CXR. -will provide IVF's -will check UA -will follow CBC in am  Code Status: Full Family Communication: no family at bedside Disposition Plan: admit to inpatient, LOS > 2 midnights, telemetry  Time spent: >30 minutes  Cici Rodriges [facility] Pager [phone]  If 7PM-7AM, please contact night-coverage [email or web address] Password [email or web address] 09/07/2012, 7:30 PM

## 2012-09-07 NOTE — ED Provider Notes (Signed)
History     CSN: [id number]  Arrival date & time 09/07/12  1627   First MD Initiated Contact with Patient 09/07/12 1642      Chief Complaint  Patient presents with  . Shortness of Breath    (Consider location/radiation/quality/duration/timing/severity/associated sxs/prior treatment) HPI Pt working in yard today states he was getting lightheaded and short of breath which is new for him. States he has noticed more prominent LLE veins for the past few days and R lower chest pain several days ago which is no longer present. Pt recently went off coumadin for cervical steroid injection. Last INR was 1.8. No fever chills, cough. +diffuse muscle spasms in all ext. .  Past Medical History  Diagnosis Date  . Homozygous Factor V Leiden mutation     Dr. Shela Commons. Vanna [place]  . Hyperlipidemia   . Hypertension   . Hemorrhoid   . Colon polyp   . PONV (postoperative nausea and vomiting)     no problems with Propofol use  . DVT (deep venous thrombosis)     Past Surgical History  Procedure Laterality Date  . Nose surgery  1972, 1985  . Fracture surgery      left ankle  . Knee arthroscopy      right  . Shoulder surgery      right  . Biceps tendon repair      right  . Vasectomy    . Hemorroidectomy  1995  . Esophagogastroduodenoscopy (egd) with propofol  03/10/2012    Procedure: ESOPHAGOGASTRODUODENOSCOPY (EGD) WITH PROPOFOL;  Surgeon: Willis Modena, MD;  Location: [facility];  Service: Endoscopy;  Laterality: N/A;  . Bravo ph study  03/10/2012    Procedure: BRAVO PH STUDY;  Surgeon: Willis Modena, MD;  Location: [facility];  Service: Endoscopy;  Laterality: N/A;    Family History  Problem Relation Age of Onset  . Cancer Mother     breast  . Cancer Sister     breast  . Cancer Brother     stomach  . Cancer Maternal Aunt     breast    History  Substance Use Topics  . Smoking status: Never Smoker   . Smokeless tobacco: Never Used  . Alcohol Use: Yes      Review  of Systems  Constitutional: Negative for fever and chills.  HENT: Negative for neck pain.   Respiratory: Positive for shortness of breath. Negative for cough, chest tightness and wheezing.   Cardiovascular: Negative for chest pain, palpitations and leg swelling.  Gastrointestinal: Negative for nausea, vomiting and abdominal pain.  Genitourinary: Negative for dysuria and frequency.  Musculoskeletal: Positive for myalgias. Negative for back pain.  Skin: Negative for rash and wound.  Neurological: Positive for dizziness and light-headedness. Negative for weakness, numbness and headaches.  All other systems reviewed and are negative.    Allergies  Sulfa drugs cross reactors  Home Medications   Current Outpatient Rx  Name  Route  Sig  Dispense  Refill  . atorvastatin (LIPITOR) 10 MG tablet   Oral   Take 10 mg by mouth every morning.          . calcium carbonate (TUMS - DOSED IN MG ELEMENTAL CALCIUM) 500 MG chewable tablet   Oral   Chew 1-4 tablets by mouth daily as needed for heartburn.          . Cyanocobalamin (VITAMIN B 12 PO)   Oral   Take 1 tablet by mouth daily.         Marland Kitchen  famotidine (PEPCID) 20 MG tablet   Oral   Take 20 mg by mouth daily.          Marland Kitchen lisinopril (PRINIVIL,ZESTRIL) 20 MG tablet   Oral   Take 20 mg by mouth every morning.          Marland Kitchen omeprazole (PRILOSEC) 40 MG capsule   Oral   Take 40 mg by mouth daily.         . Testosterone (FORTESTA TD)   Transdermal   Place 40 mg onto the skin daily. Place on upper thigh         . Tetrahydrozoline HCl (VISINE OP)   Ophthalmic   Apply 1-2 drops to eye 4 (four) times daily as needed (dry eyes).         . warfarin (COUMADIN) 5 MG tablet   Oral   Take 2.5-5 mg by mouth every morning. Mon & Fri-0.5 half tab (2.5 mg total); All other days-1 tab (5 mg total )           BP 121/71  Pulse 100  Temp(Src) 97.7 F (36.5 C) (Oral)  Resp 21  SpO2 96%  Physical Exam  Nursing note and vitals  reviewed. Constitutional: He is oriented to person, place, and time. He appears well-developed and well-nourished. No distress.  HENT:  Head: Normocephalic and atraumatic.  Mouth/Throat: Oropharynx is clear and moist.  Eyes: EOM are normal. Pupils are equal, round, and reactive to light.  Neck: Normal range of motion. Neck supple.  Cardiovascular: Regular rhythm.   tachycardia  Pulmonary/Chest: Effort normal and breath sounds normal. No respiratory distress. He has no wheezes. He has no rales. He exhibits no tenderness.  Abdominal: Soft. Bowel sounds are normal. He exhibits no distension and no mass. There is no tenderness. There is no rebound and no guarding.  Musculoskeletal: Normal range of motion. He exhibits no edema and no tenderness.  Prominent LLE superficial veins. 2+ DP bl LE. No calf tenderness or swelling.   Neurological: He is alert and oriented to person, place, and time.  5/5 motor in all ext, sensation intact  Skin: Skin is warm and dry. No rash noted. No erythema.  Psychiatric: He has a normal mood and affect. His behavior is normal.    ED Course  Procedures (including critical care time)  Labs Reviewed  CBC - Abnormal; Notable for the following:    WBC 15.4 (*)    Hemoglobin 17.1 (*)    All other components within normal limits  BASIC METABOLIC PANEL - Abnormal; Notable for the following:    Sodium 133 (*)    Chloride 93 (*)    Glucose, Bld 145 (*)    BUN 33 (*)    Creatinine, Ser 2.81 (*)    GFR calc non Af Amer 24 (*)    GFR calc Af Amer 28 (*)    All other components within normal limits  PROTIME-INR - Abnormal; Notable for the following:    Prothrombin Time 19.9 (*)    INR 1.76 (*)    All other components within normal limits  HEPATIC FUNCTION PANEL - Abnormal; Notable for the following:    AST 38 (*)    ALT 93 (*)    All other components within normal limits  URINALYSIS, ROUTINE W REFLEX MICROSCOPIC - Abnormal; Notable for the following:    Color,  Urine AMBER (*)    Bilirubin Urine SMALL (*)    All other components within normal limits  CK -  Abnormal; Notable for the following:    Total CK 278 (*)    All other components within normal limits  APTT  BLOOD GAS, ARTERIAL  POCT I-STAT TROPONIN I   Dg Chest 2 View  09/07/2012  *RADIOLOGY REPORT*  Clinical Data: Shortness of breath.  Dizziness and hypertension.  CHEST - 2 VIEW  Comparison: 11/09/2008.  Findings: Normal heart size with clear lung fields.  No bony abnormality.  No pulmonary nodules, effusion, or pneumothorax.  IMPRESSION: No active cardiopulmonary disease.  No change from priors.   Original Report Authenticated By: Davonna Belling, M.D.      1. Acute renal failure   2. Dyspnea       Date: 09/07/2012  Rate:99  Rhythm: normal sinus rhythm  QRS Axis: normal  Intervals: normal  ST/T Wave abnormalities: normal  Conduction Disutrbances:none  Narrative Interpretation:   Old EKG Reviewed: none available   MDM   Discussed with Dr Gwenlyn Perking. Will admit for Dr Valentina Lucks.        Loren Racer, MD 09/07/12 [phone]

## 2012-09-07 NOTE — ED Notes (Signed)
Pt c/o generalized body cramps starting today; pt sts hx of cramps in right leg after having DVT; pt sts new today with generalized cramps; pt sts dizziness and SOB and hoarse voice

## 2012-09-08 ENCOUNTER — Inpatient Hospital Stay (HOSPITAL_COMMUNITY): Payer: BC Managed Care – PPO

## 2012-09-08 LAB — CBC
MCH: 30.6 pg (ref 26.0–34.0)
Platelets: 151 10*3/uL (ref 150–400)
RBC: 4.93 MIL/uL (ref 4.22–5.81)
RDW: 13.7 % (ref 11.5–15.5)

## 2012-09-08 LAB — BASIC METABOLIC PANEL
Calcium: 8.9 mg/dL (ref 8.4–10.5)
GFR calc non Af Amer: 55 mL/min — ABNORMAL LOW (ref >90)
Glucose, Bld: 104 mg/dL — ABNORMAL HIGH (ref 70–99)
Sodium: 138 mEq/L (ref 135–145)

## 2012-09-08 LAB — PROTIME-INR: INR: 2.2 — ABNORMAL HIGH (ref 0.00–1.49)

## 2012-09-08 MED ORDER — SODIUM CHLORIDE 0.9 % IV SOLN
INTRAVENOUS | Status: DC
  Administered 2012-09-08 (×3): via INTRAVENOUS

## 2012-09-08 MED ORDER — WARFARIN SODIUM 2.5 MG PO TABS
2.5000 mg | ORAL_TABLET | Freq: Once | ORAL | Status: AC
  Administered 2012-09-08: 2.5 mg via ORAL
  Filled 2012-09-08: qty 1

## 2012-09-08 NOTE — Progress Notes (Signed)
Subjective: Feels better today, less cramping, starting to make urine over night.  Objective: Vital signs in last 24 hours: Temp:  [97.7 F (36.5 C)-98.7 F (37.1 C)] 98.1 F (36.7 C) (05/14 0629) Pulse Rate:  [64-100] 69 (05/14 0629) Resp:  [16-21] 20 (05/14 0629) BP: (110-135)/(63-86) 113/63 mmHg (05/14 0629) SpO2:  [94 %-99 %] 97 % (05/14 0629) Weight:  [87.544 kg (193 lb)] 87.544 kg (193 lb) (05/13 2120) Weight change:     Intake/Output from previous day: 05/13 0701 - 05/14 0700 In: -  Out: 100 [Urine:100] Intake/Output this shift:    General appearance: alert and cooperative Resp: clear to auscultation bilaterally Cardio: regular rate and rhythm, S1, S2 normal, no murmur, click, rub or gallop GI: soft, non-tender; bowel sounds normal; no masses,  no organomegaly Extremities: extremities normal, atraumatic, no cyanosis or edema  Lab Results:  Recent Labs  09/07/12 1710 09/08/12 0625  WBC 15.4* 10.0  HGB 17.1* 15.1  HCT 48.8 42.9  PLT 172 151   BMET  Recent Labs  09/07/12 1710  NA 133*  K 4.1  CL 93*  CO2 19  GLUCOSE 145*  BUN 33*  CREATININE 2.81*  CALCIUM 10.4    Studies/Results: Dg Chest 2 View  09/07/2012   *RADIOLOGY REPORT*  Clinical Data: Shortness of breath.  Dizziness and hypertension.  CHEST - 2 VIEW  Comparison: 11/09/2008.  Findings: Normal heart size with clear lung fields.  No bony abnormality.  No pulmonary nodules, effusion, or pneumothorax.  IMPRESSION: No active cardiopulmonary disease.  No change from priors.   Original Report Authenticated By: Davonna Belling, M.D.   Nm Pulmonary Perf And Vent  09/07/2012   *RADIOLOGY REPORT*  Clinical Data:  Shortness of breath.  History of DVT.  NUCLEAR MEDICINE VENTILATION - PERFUSION LUNG SCAN  Technique:  Ventilation images were obtained in multiple projections using inhaled aerosol technetium 99 M DTPA.  Perfusion images were obtained in multiple projections after intravenous injection of Tc-50m  MAA.  Radiopharmaceuticals:  Tc-32m DTPA aerosol and 5.0 mCi Tc-23m MAA.  Comparison: Chest x-ray earlier today.  Findings:  Ventilation:  Ventilation portion demonstrates no focal defect or abnormality.  Perfusion:   No wedge shaped peripheral perfusion defects to suggest acute pulmonary embolism  IMPRESSION: No evidence of pulmonary embolus.   Original Report Authenticated By: Charlett Nose, M.D.    Medications: I have reviewed the patient's current medications.  Assessment/Plan: Principal Problem:   ARF (acute renal failure)? Etioilogy.  Was having diffuse muscle pain yesterday,CK only minimally elevated, UA unremarkable check renal U/S and ask nephrology to consult.  ACEI held.  Continue IVFs Active Problems:   Factor V Leiden mutation   Chest discomfort cardiac enzymes negative, EKG nonacute   HTN (hypertension)   GERD (gastroesophageal reflux disease) PPI   HLD (hyperlipidemia) hold atorvastatin   History of DVT of lower extremity therapeutic on coumadin.  V/Q negative for PE. Warfarin therapeutic, pharmacy managing, D/C lovenox.  Doubt he has had a new DVT.   LOS: 1 day   Jianna Drabik JOSEPH 09/08/2012, 7:29 AM

## 2012-09-08 NOTE — Progress Notes (Signed)
Utilization review completed.  

## 2012-09-08 NOTE — Progress Notes (Signed)
ANTICOAGULATION CONSULT NOTE - Initial Consult  Pharmacy Consult for Lovenox/Warfarin Indication: Factor V Leiden, h/o DVT  Allergies  Allergen Reactions  . Sulfa Drugs Cross Reactors Hives    Patient-Reported Measurements: Height: 5\' 9"  (175.3 cm) Weight: 193 lb (87.544 kg) IBW/kg (Calculated) : 70.7 Ht: 5 ft 9 in Wt: 193 lbs (87.5 kg)  Vital Signs: Temp: 97.9 F (36.6 C) (05/14 0947) Temp src: Oral (05/14 0947) BP: 113/74 mmHg (05/14 0947) Pulse Rate: 82 (05/14 0947)  Labs:  Recent Labs  09/07/12 1710 09/07/12 1711 09/07/12 2228 09/08/12 0625  HGB 17.1*  --   --  15.1  HCT 48.8  --   --  42.9  PLT 172  --   --  151  APTT 37  --   --   --   LABPROT 19.9*  --   --  23.5*  INR 1.76*  --   --  2.20*  CREATININE 2.81*  --   --  1.42*  CKTOTAL  --  278*  --   --   TROPONINI  --   --  <0.30 <0.30    Estimated Creatinine Clearance: 65.1 ml/min (by C-G formula based on Cr of 1.42).   Medical History: Past Medical History  Diagnosis Date  . Homozygous Factor V Leiden mutation     Dr. Shela Commons. Vanna [place]  . Hyperlipidemia   . Hypertension   . Hemorrhoid   . Colon polyp   . PONV (postoperative nausea and vomiting)     no problems with Propofol use  . DVT (deep venous thrombosis)     Assessment: 52 y/o M on warfarin PTA for h/o DVT. Recently off warfarin for cervical steroid injection last Wednesday (re-started with warfarin/lovenox bridge after this and finished the lovenox Sunday). INR today is 2.2, at goal.  Patients home dose of coumadin is 5mg  on Mon and Fridays and 2.5mg  all other days.    Goal of Therapy:  INR 2-3 Monitor platelets by anticoagulation protocol: Yes   Plan:  Coumadin 2.5mg  po x 1 dose tonight (back to home regimen) Daily PT/INR  Wendie Simmer, PharmD, BCPS Clinical Pharmacist  Pager: [phone]

## 2012-09-09 LAB — CBC WITH DIFFERENTIAL/PLATELET
Basophils Relative: 1 % (ref 0–1)
Eosinophils Absolute: 0.2 10*3/uL (ref 0.0–0.7)
Eosinophils Relative: 4 % (ref 0–5)
Hemoglobin: 14.8 g/dL (ref 13.0–17.0)
MCH: 31 pg (ref 26.0–34.0)
MCHC: 34.8 g/dL (ref 30.0–36.0)
Monocytes Relative: 9 % (ref 3–12)
Neutrophils Relative %: 58 % (ref 43–77)
Platelets: 142 10*3/uL — ABNORMAL LOW (ref 150–400)

## 2012-09-09 LAB — BASIC METABOLIC PANEL
BUN: 23 mg/dL (ref 6–23)
Calcium: 9.4 mg/dL (ref 8.4–10.5)
GFR calc non Af Amer: 79 mL/min — ABNORMAL LOW (ref >90)
Glucose, Bld: 91 mg/dL (ref 70–99)

## 2012-09-09 MED ORDER — WARFARIN SODIUM 2.5 MG PO TABS: 2.5000 mg | ORAL_TABLET | Freq: Every day | ORAL | Status: DC

## 2012-09-09 NOTE — Discharge Summary (Signed)
Physician Discharge Summary  Patient ID: Steven Conley MRN: [id number] DOB/AGE: 1961-01-14 52 y.o.  Admit date: 09/07/2012 Discharge date: 09/09/2012  Admission Diagnoses: Acute renal failure Shortness of breath Chest discomfort Hypertension Hyperlipidemia Gastroesophageal reflux disease History of DVT Hypercoagulable he  Discharge Diagnoses:  Principal Problem:   ARF (acute renal failure), prerenal Active Problems:   Factor V Leiden mutation   SOB (shortness of breath)   Chest discomfort   HTN (hypertension)   GERD (gastroesophageal reflux disease)   HLD (hyperlipidemia)   History of DVT of lower extremity   Discharged Condition: good  Hospital Course: The patient was admitted on May 13 complaining of severe diffuse body aches, shortness of breath and some right-sided chest discomfort. In the emergency room he was found to have leukocytosis, mild hyponatremia and acute renal failure with creatinine of 2.1. Liver tests were mildly elevated. Sodium 133. WBC 15.4. The patient had a VQ scan which was negative for pulmonary embolus. He was given IV fluids and quickly established a good urine output. By the next morning his creatinine was down to 1.4 and at discharge 1.05. Renal ultrasound was normal. He had excellent urinary output. It was felt that his acute renal failure with prerenal likely from iron depletion in the setting of an ACE inhibitor. His blood pressure remained low normal and ACE inhibitor was held at discharge. Other medications were continued. Warfarin was slightly adjusted up or in dose. INR was 1.85 at discharge. The patient felt well at discharge symptoms  Consults: None  Significant Diagnostic Studies: labs: As above and radiology: CXR: normal, Ultrasound: Of kidneys normal and VQ scan negative pulmonary embolus  Treatments: IV hydration  Discharge Exam: Blood pressure 113/77, pulse 58, temperature 97.8 F (36.6 C), temperature source Oral, resp. rate 18,  height 5\' 9"  (1.753 m), weight 89.223 kg (196 lb 11.2 oz), SpO2 99.00%. General appearance: alert and cooperative Resp: clear to auscultation bilaterally Cardio: regular rate and rhythm, S1, S2 normal, no murmur, click, rub or gallop  Disposition: 01-Home or Self Care     Medication List    STOP taking these medications       lisinopril 20 MG tablet  Commonly known as:  PRINIVIL,ZESTRIL      TAKE these medications       atorvastatin 10 MG tablet  Commonly known as:  LIPITOR  Take 10 mg by mouth every morning.     calcium carbonate 500 MG chewable tablet  Commonly known as:  TUMS - dosed in mg elemental calcium  Chew 1-4 tablets by mouth daily as needed for heartburn.     famotidine 20 MG tablet  Commonly known as:  PEPCID  Take 20 mg by mouth daily.     FORTESTA TD  Place 40 mg onto the skin daily. Place on upper thigh     omeprazole 40 MG capsule  Commonly known as:  PRILOSEC  Take 40 mg by mouth daily.     VISINE OP  Apply 1-2 drops to eye 4 (four) times daily as needed (dry eyes).     VITAMIN B 12 PO  Take 1 tablet by mouth daily.     warfarin 2.5 MG tablet  Commonly known as:  COUMADIN  Take 1-2 tablets (2.5-5 mg total) by mouth daily. Takes 1 tab (5mg ) on Mon Wed and Fridays, 1/2 tab (2.5mg ) all other days.  Take 5 mg on Thursday 5/15 (not the usual 2.5 mg)  Follow-up Information   Follow up with Lillia Mountain, MD In 1 week.   Contact information:   [address], [address] [place] Jaynie Crumble [place] [address] [phone]       Signed: Lillia Mountain 09/09/2012, 7:32 AM

## 2012-09-09 NOTE — Progress Notes (Signed)
Patient discharged to home. Patient AVS reviewed. Patient verbalized understanding of medications and follow-up appointments.  Patient remains stable; no signs or symptoms of distress.  Patient educated to return to the ER in cases of SOB, dizziness, fever, chest pain, or fainting.  

## 2012-11-10 ENCOUNTER — Other Ambulatory Visit: Payer: Self-pay | Admitting: Neurosurgery

## 2012-11-15 ENCOUNTER — Encounter (HOSPITAL_COMMUNITY): Payer: Self-pay

## 2012-11-15 ENCOUNTER — Encounter (HOSPITAL_COMMUNITY): Payer: Self-pay | Admitting: Pharmacy Technician

## 2012-11-15 ENCOUNTER — Encounter (HOSPITAL_COMMUNITY)
Admission: RE | Admit: 2012-11-15 | Discharge: 2012-11-15 | Disposition: A | Discharge status: Home / Self Care | Payer: BC Managed Care – PPO | Source: Ambulatory Visit | Attending: Neurosurgery | Admitting: Neurosurgery

## 2012-11-15 HISTORY — DX: Gastro-esophageal reflux disease without esophagitis: K21.9

## 2012-11-15 HISTORY — DX: Chronic kidney disease, unspecified: N18.9

## 2012-11-15 HISTORY — DX: Disease of blood and blood-forming organs, unspecified: D75.9

## 2012-11-15 LAB — SURGICAL PCR SCREEN: Staphylococcus aureus: NEGATIVE

## 2012-11-15 LAB — CBC WITH DIFFERENTIAL/PLATELET
Basophils Relative: 1 % (ref 0–1)
Eosinophils Relative: 6 % — ABNORMAL HIGH (ref 0–5)
HCT: 46.8 % (ref 39.0–52.0)
Hemoglobin: 16.8 g/dL (ref 13.0–17.0)
Lymphs Abs: 2 10*3/uL (ref 0.7–4.0)
MCH: 31.1 pg (ref 26.0–34.0)
MCV: 86.7 fL (ref 78.0–100.0)
Monocytes Absolute: 0.7 10*3/uL (ref 0.1–1.0)
Monocytes Relative: 9 % (ref 3–12)
Neutro Abs: 4.8 10*3/uL (ref 1.7–7.7)
Platelets: 151 10*3/uL (ref 150–400)
RBC: 5.4 MIL/uL (ref 4.22–5.81)

## 2012-11-15 LAB — BASIC METABOLIC PANEL
CO2: 28 mEq/L (ref 19–32)
Calcium: 10 mg/dL (ref 8.4–10.5)
Chloride: 105 mEq/L (ref 96–112)
Glucose, Bld: 96 mg/dL (ref 70–99)
Sodium: 141 mEq/L (ref 135–145)

## 2012-11-15 MED ORDER — CEFAZOLIN SODIUM-DEXTROSE 2-3 GM-% IV SOLR
2.0000 g | INTRAVENOUS | Status: AC
  Administered 2012-11-16: 2 g via INTRAVENOUS
  Filled 2012-11-15: qty 50

## 2012-11-15 NOTE — Pre-Procedure Instructions (Signed)
CLARION MOONEYHAN  11/15/2012   Your procedure is scheduled on:  11-16-2012  Tuesday   Report to [facility] at 5:30 AM.Check in at admitting and wait in lobby,someone will come out to get you  Call this number if you have problems the morning of surgery: [phone]   Remember:   Do not eat food or drink liquids after midnight.    Take these medicines the morning of surgery with A SIP OF WATER: none   Do not wear jewelry,   Do not wear lotions, powders, or perfumes.   Do not shave 48 hours prior to surgery. Men may shave face and neck.  Do not bring valuables to the hospital.  [facility] is not responsible  for any belongings or valuables.  Contacts, dentures or bridgework may not be worn into surgery.   Leave suitcase in the car. After surgery it may be brought to your room.   For patients admitted to the hospital, checkout time is 11:00 AM the day of discharge.   Patients discharged the day of surgery will not be allowed to drive home.   Name and phone number of your driver: wife Thurston Hole   Special Instructions: Shower tonight using CHG and in the morning   Please read over the following fact sheets that you were given: Pain Booklet, Coughing and Deep Breathing and MRSA Information

## 2012-11-16 ENCOUNTER — Ambulatory Visit (HOSPITAL_COMMUNITY)
Admission: RE | Admit: 2012-11-16 | Discharge: 2012-11-16 | Disposition: A | Discharge status: Home / Self Care | Payer: BC Managed Care – PPO | Source: Ambulatory Visit | Attending: Neurosurgery | Admitting: Neurosurgery

## 2012-11-16 ENCOUNTER — Ambulatory Visit (HOSPITAL_COMMUNITY): Payer: BC Managed Care – PPO

## 2012-11-16 ENCOUNTER — Encounter (HOSPITAL_COMMUNITY): Payer: Self-pay | Admitting: *Deleted

## 2012-11-16 ENCOUNTER — Encounter (HOSPITAL_COMMUNITY): Payer: Self-pay | Admitting: Anesthesiology

## 2012-11-16 ENCOUNTER — Ambulatory Visit (HOSPITAL_COMMUNITY): Payer: BC Managed Care – PPO | Admitting: Anesthesiology

## 2012-11-16 ENCOUNTER — Encounter (HOSPITAL_COMMUNITY)
Admission: RE | Disposition: A | Discharge status: Home / Self Care | Payer: Self-pay | Source: Ambulatory Visit | Attending: Neurosurgery

## 2012-11-16 DIAGNOSIS — M4712 Other spondylosis with myelopathy, cervical region: Secondary | ICD-10-CM | POA: Insufficient documentation

## 2012-11-16 DIAGNOSIS — Z7901 Long term (current) use of anticoagulants: Secondary | ICD-10-CM | POA: Insufficient documentation

## 2012-11-16 DIAGNOSIS — Z79899 Other long term (current) drug therapy: Secondary | ICD-10-CM | POA: Insufficient documentation

## 2012-11-16 DIAGNOSIS — I10 Essential (primary) hypertension: Secondary | ICD-10-CM | POA: Insufficient documentation

## 2012-11-16 DIAGNOSIS — Z86718 Personal history of other venous thrombosis and embolism: Secondary | ICD-10-CM | POA: Insufficient documentation

## 2012-11-16 DIAGNOSIS — D6859 Other primary thrombophilia: Secondary | ICD-10-CM | POA: Insufficient documentation

## 2012-11-16 DIAGNOSIS — Z01812 Encounter for preprocedural laboratory examination: Secondary | ICD-10-CM | POA: Insufficient documentation

## 2012-11-16 HISTORY — PX: ANTERIOR CERVICAL DECOMP/DISCECTOMY FUSION: SHX1161

## 2012-11-16 SURGERY — ANTERIOR CERVICAL DECOMPRESSION/DISCECTOMY FUSION 2 LEVELS
Anesthesia: General | Site: Neck | Wound class: Clean

## 2012-11-16 MED ORDER — HYDROMORPHONE HCL PF 1 MG/ML IJ SOLN
INTRAMUSCULAR | Status: AC
  Filled 2012-11-16: qty 1

## 2012-11-16 MED ORDER — SODIUM CHLORIDE 0.9 % IR SOLN
Status: DC | PRN
  Administered 2012-11-16: 09:00:00

## 2012-11-16 MED ORDER — LACTATED RINGERS IV SOLN
INTRAVENOUS | Status: DC | PRN
  Administered 2012-11-16 (×2): via INTRAVENOUS

## 2012-11-16 MED ORDER — CALCIUM CARBONATE ANTACID 500 MG PO CHEW
1.0000 | CHEWABLE_TABLET | Freq: Every day | ORAL | Status: DC | PRN
  Filled 2012-11-16: qty 4

## 2012-11-16 MED ORDER — FENTANYL CITRATE 0.05 MG/ML IJ SOLN
INTRAMUSCULAR | Status: DC | PRN
  Administered 2012-11-16: 50 ug via INTRAVENOUS
  Administered 2012-11-16: 100 ug via INTRAVENOUS
  Administered 2012-11-16 (×2): 50 ug via INTRAVENOUS

## 2012-11-16 MED ORDER — ALUM & MAG HYDROXIDE-SIMETH 200-200-20 MG/5ML PO SUSP: 30.0000 mL | Freq: Four times a day (QID) | ORAL | Status: DC | PRN

## 2012-11-16 MED ORDER — PROPOFOL 10 MG/ML IV BOLUS
INTRAVENOUS | Status: DC | PRN
  Administered 2012-11-16: 160 mg via INTRAVENOUS

## 2012-11-16 MED ORDER — LIDOCAINE HCL (CARDIAC) 20 MG/ML IV SOLN
INTRAVENOUS | Status: DC | PRN
  Administered 2012-11-16: 80 mg via INTRAVENOUS

## 2012-11-16 MED ORDER — CYCLOBENZAPRINE HCL 10 MG PO TABS
10.0000 mg | ORAL_TABLET | Freq: Three times a day (TID) | ORAL | Status: DC | PRN
  Administered 2012-11-16: 10 mg via ORAL

## 2012-11-16 MED ORDER — SODIUM CHLORIDE 0.9 % IV SOLN
INTRAVENOUS | Status: AC
  Filled 2012-11-16: qty 500

## 2012-11-16 MED ORDER — PHENOL 1.4 % MT LIQD: 1.0000 | OROMUCOSAL | Status: DC | PRN

## 2012-11-16 MED ORDER — HYDROMORPHONE HCL PF 1 MG/ML IJ SOLN: 0.5000 mg | INTRAMUSCULAR | Status: DC | PRN

## 2012-11-16 MED ORDER — NEOSTIGMINE METHYLSULFATE 1 MG/ML IJ SOLN
INTRAMUSCULAR | Status: DC | PRN
  Administered 2012-11-16: 5 mg via INTRAVENOUS

## 2012-11-16 MED ORDER — MIDAZOLAM HCL 5 MG/5ML IJ SOLN
INTRAMUSCULAR | Status: DC | PRN
  Administered 2012-11-16 (×2): 1 mg via INTRAVENOUS

## 2012-11-16 MED ORDER — SENNA 8.6 MG PO TABS: 1.0000 | ORAL_TABLET | Freq: Two times a day (BID) | ORAL | Status: DC

## 2012-11-16 MED ORDER — CYCLOBENZAPRINE HCL 10 MG PO TABS: 10.0000 mg | ORAL_TABLET | Freq: Three times a day (TID) | ORAL | Status: DC | PRN

## 2012-11-16 MED ORDER — SODIUM CHLORIDE 0.9 % IJ SOLN
3.0000 mL | Freq: Two times a day (BID) | INTRAMUSCULAR | Status: DC
  Administered 2012-11-16: 3 mL via INTRAVENOUS

## 2012-11-16 MED ORDER — ACETAMINOPHEN 325 MG PO TABS: 650.0000 mg | ORAL_TABLET | ORAL | Status: DC | PRN

## 2012-11-16 MED ORDER — HEMOSTATIC AGENTS (NO CHARGE) OPTIME
TOPICAL | Status: DC | PRN
  Administered 2012-11-16: 1 via TOPICAL

## 2012-11-16 MED ORDER — OXYCODONE-ACETAMINOPHEN 5-325 MG PO TABS: 1.0000 | ORAL_TABLET | ORAL | Status: DC | PRN

## 2012-11-16 MED ORDER — MENTHOL 3 MG MT LOZG: 1.0000 | LOZENGE | OROMUCOSAL | Status: DC | PRN

## 2012-11-16 MED ORDER — CYCLOBENZAPRINE HCL 10 MG PO TABS
ORAL_TABLET | ORAL | Status: AC
  Filled 2012-11-16: qty 1

## 2012-11-16 MED ORDER — 0.9 % SODIUM CHLORIDE (POUR BTL) OPTIME
TOPICAL | Status: DC | PRN
  Administered 2012-11-16: 1000 mL

## 2012-11-16 MED ORDER — PANTOPRAZOLE SODIUM 40 MG PO TBEC: 80.0000 mg | DELAYED_RELEASE_TABLET | Freq: Every day | ORAL | Status: DC

## 2012-11-16 MED ORDER — ROCURONIUM BROMIDE 100 MG/10ML IV SOLN
INTRAVENOUS | Status: DC | PRN
  Administered 2012-11-16: 20 mg via INTRAVENOUS
  Administered 2012-11-16: 50 mg via INTRAVENOUS
  Administered 2012-11-16: 10 mg via INTRAVENOUS

## 2012-11-16 MED ORDER — HYDROCODONE-ACETAMINOPHEN 5-325 MG PO TABS
1.0000 | ORAL_TABLET | ORAL | Status: DC | PRN
  Administered 2012-11-16: 2 via ORAL
  Filled 2012-11-16: qty 2

## 2012-11-16 MED ORDER — DEXTROSE 5 % IV SOLN
INTRAVENOUS | Status: DC | PRN
  Administered 2012-11-16: 08:00:00 via INTRAVENOUS

## 2012-11-16 MED ORDER — SODIUM CHLORIDE 0.9 % IJ SOLN: 3.0000 mL | INTRAMUSCULAR | Status: DC | PRN

## 2012-11-16 MED ORDER — ATORVASTATIN CALCIUM 10 MG PO TABS
10.0000 mg | ORAL_TABLET | Freq: Every day | ORAL | Status: DC
  Filled 2012-11-16: qty 1

## 2012-11-16 MED ORDER — OXYCODONE HCL 5 MG/5ML PO SOLN: 5.0000 mg | Freq: Once | ORAL | Status: AC | PRN

## 2012-11-16 MED ORDER — METOCLOPRAMIDE HCL 5 MG/ML IJ SOLN
INTRAMUSCULAR | Status: DC | PRN
  Administered 2012-11-16: 10 mg via INTRAVENOUS

## 2012-11-16 MED ORDER — OXYCODONE HCL 5 MG PO TABS
5.0000 mg | ORAL_TABLET | Freq: Once | ORAL | Status: AC | PRN
  Administered 2012-11-16: 5 mg via ORAL

## 2012-11-16 MED ORDER — DEXAMETHASONE SODIUM PHOSPHATE 10 MG/ML IJ SOLN: 10.0000 mg | INTRAMUSCULAR | Status: DC

## 2012-11-16 MED ORDER — GLYCOPYRROLATE 0.2 MG/ML IJ SOLN
INTRAMUSCULAR | Status: DC | PRN
  Administered 2012-11-16: .8 mg via INTRAVENOUS

## 2012-11-16 MED ORDER — CEFAZOLIN SODIUM 1-5 GM-% IV SOLN
1.0000 g | Freq: Three times a day (TID) | INTRAVENOUS | Status: DC
  Administered 2012-11-16: 1 g via INTRAVENOUS
  Filled 2012-11-16 (×2): qty 50

## 2012-11-16 MED ORDER — THROMBIN 5000 UNITS EX SOLR
CUTANEOUS | Status: DC | PRN
  Administered 2012-11-16 (×2): 5000 [IU] via TOPICAL

## 2012-11-16 MED ORDER — HYDROMORPHONE HCL PF 1 MG/ML IJ SOLN
0.2500 mg | INTRAMUSCULAR | Status: DC | PRN
  Administered 2012-11-16 (×4): 0.5 mg via INTRAVENOUS

## 2012-11-16 MED ORDER — ONDANSETRON HCL 4 MG/2ML IJ SOLN: 4.0000 mg | Freq: Four times a day (QID) | INTRAMUSCULAR | Status: DC | PRN

## 2012-11-16 MED ORDER — ACETAMINOPHEN 650 MG RE SUPP: 650.0000 mg | RECTAL | Status: DC | PRN

## 2012-11-16 MED ORDER — OXYCODONE HCL 5 MG PO TABS
ORAL_TABLET | ORAL | Status: AC
  Filled 2012-11-16: qty 1

## 2012-11-16 MED ORDER — BACITRACIN 50000 UNITS IM SOLR
INTRAMUSCULAR | Status: AC
  Filled 2012-11-16: qty 1

## 2012-11-16 MED ORDER — ONDANSETRON HCL 4 MG/2ML IJ SOLN
INTRAMUSCULAR | Status: DC | PRN
  Administered 2012-11-16: 4 mg via INTRAVENOUS

## 2012-11-16 MED ORDER — ARTIFICIAL TEARS OP OINT
TOPICAL_OINTMENT | OPHTHALMIC | Status: DC | PRN
  Administered 2012-11-16: 1 via OPHTHALMIC

## 2012-11-16 MED ORDER — HYDROCODONE-ACETAMINOPHEN 5-325 MG PO TABS: 1.0000 | ORAL_TABLET | ORAL | Status: DC | PRN

## 2012-11-16 MED ORDER — DEXAMETHASONE SODIUM PHOSPHATE 10 MG/ML IJ SOLN
INTRAMUSCULAR | Status: AC
  Administered 2012-11-16: 10 mg via INTRAVENOUS
  Filled 2012-11-16: qty 1

## 2012-11-16 MED ORDER — ONDANSETRON HCL 4 MG/2ML IJ SOLN: 4.0000 mg | INTRAMUSCULAR | Status: DC | PRN

## 2012-11-16 SURGICAL SUPPLY — 59 items
APL SKNCLS STERI-STRIP NONHPOA (GAUZE/BANDAGES/DRESSINGS) ×1
BAG DECANTER FOR FLEXI CONT (MISCELLANEOUS) ×2 IMPLANT
BENZOIN TINCTURE PRP APPL 2/3 (GAUZE/BANDAGES/DRESSINGS) ×2 IMPLANT
BRUSH SCRUB EZ PLAIN DRY (MISCELLANEOUS) ×2 IMPLANT
BUR MATCHSTICK NEURO 3.0 LAGG (BURR) ×2 IMPLANT
CAGE PEEK 6X14X11 (Cage) ×2 IMPLANT
CAGE SPNL 11X14X6XRADOPQ (Cage) IMPLANT
CANISTER SUCTION 2500CC (MISCELLANEOUS) ×2 IMPLANT
CLOTH BEACON ORANGE TIMEOUT ST (SAFETY) ×2 IMPLANT
CONT SPEC 4OZ CLIKSEAL STRL BL (MISCELLANEOUS) ×2 IMPLANT
DRAPE C-ARM 42X72 X-RAY (DRAPES) ×4 IMPLANT
DRAPE LAPAROTOMY 100X72 PEDS (DRAPES) ×2 IMPLANT
DRAPE MICROSCOPE ZEISS OPMI (DRAPES) ×2 IMPLANT
DRAPE POUCH INSTRU U-SHP 10X18 (DRAPES) ×2 IMPLANT
DRILL BIT (BIT) ×1 IMPLANT
DURAPREP 6ML APPLICATOR 50/CS (WOUND CARE) ×2 IMPLANT
ELECT COATED BLADE 2.86 ST (ELECTRODE) ×2 IMPLANT
ELECT REM PT RETURN 9FT ADLT (ELECTROSURGICAL) ×2
ELECTRODE REM PT RTRN 9FT ADLT (ELECTROSURGICAL) ×1 IMPLANT
GAUZE SPONGE 4X4 16PLY XRAY LF (GAUZE/BANDAGES/DRESSINGS) IMPLANT
GLOVE BIOGEL M 8.0 STRL (GLOVE) ×1 IMPLANT
GLOVE BIOGEL PI IND STRL 7.0 (GLOVE) IMPLANT
GLOVE BIOGEL PI INDICATOR 7.0 (GLOVE) ×2
GLOVE ECLIPSE 8.5 STRL (GLOVE) ×2 IMPLANT
GLOVE EXAM NITRILE LRG STRL (GLOVE) IMPLANT
GLOVE EXAM NITRILE MD LF STRL (GLOVE) ×1 IMPLANT
GLOVE EXAM NITRILE XL STR (GLOVE) IMPLANT
GLOVE EXAM NITRILE XS STR PU (GLOVE) IMPLANT
GLOVE SS BIOGEL STRL SZ 6.5 (GLOVE) IMPLANT
GLOVE SUPERSENSE BIOGEL SZ 6.5 (GLOVE) ×2
GOWN BRE IMP SLV AUR LG STRL (GOWN DISPOSABLE) ×1 IMPLANT
GOWN BRE IMP SLV AUR XL STRL (GOWN DISPOSABLE) ×3 IMPLANT
GOWN STRL REIN 2XL LVL4 (GOWN DISPOSABLE) IMPLANT
HEAD HALTER (SOFTGOODS) ×2 IMPLANT
HEMOSTAT SURGICEL 2X14 (HEMOSTASIS) IMPLANT
KIT BASIN OR (CUSTOM PROCEDURE TRAY) ×2 IMPLANT
KIT ROOM TURNOVER OR (KITS) ×2 IMPLANT
NDL SPNL 20GX3.5 QUINCKE YW (NEEDLE) ×1 IMPLANT
NEEDLE SPNL 20GX3.5 QUINCKE YW (NEEDLE) ×2 IMPLANT
NS IRRIG 1000ML POUR BTL (IV SOLUTION) ×2 IMPLANT
PACK LAMINECTOMY NEURO (CUSTOM PROCEDURE TRAY) ×2 IMPLANT
PAD ARMBOARD 7.5X6 YLW CONV (MISCELLANEOUS) ×6 IMPLANT
PEEK CAGE 7X14X11 (Cage) ×1 IMPLANT
PLATE VISION ELITE 40MM (Plate) ×1 IMPLANT
RUBBERBAND STERILE (MISCELLANEOUS) ×4 IMPLANT
SCREW ST 13X4XST VA NS SPNE (Screw) IMPLANT
SCREW ST VAR 4 ATL (Screw) ×12 IMPLANT
SPONGE GAUZE 4X4 12PLY (GAUZE/BANDAGES/DRESSINGS) ×2 IMPLANT
SPONGE INTESTINAL PEANUT (DISPOSABLE) ×2 IMPLANT
SPONGE SURGIFOAM ABS GEL SZ50 (HEMOSTASIS) ×2 IMPLANT
STRIP CLOSURE SKIN 1/2X4 (GAUZE/BANDAGES/DRESSINGS) ×2 IMPLANT
SUT PDS AB 5-0 P3 18 (SUTURE) ×2 IMPLANT
SUT VIC AB 3-0 SH 8-18 (SUTURE) ×2 IMPLANT
SYR 20ML ECCENTRIC (SYRINGE) ×2 IMPLANT
TAPE CLOTH 4X10 WHT NS (GAUZE/BANDAGES/DRESSINGS) ×1 IMPLANT
TAPE CLOTH SURG 4X10 WHT LF (GAUZE/BANDAGES/DRESSINGS) ×1 IMPLANT
TOWEL OR 17X24 6PK STRL BLUE (TOWEL DISPOSABLE) ×2 IMPLANT
TOWEL OR 17X26 10 PK STRL BLUE (TOWEL DISPOSABLE) ×2 IMPLANT
WATER STERILE IRR 1000ML POUR (IV SOLUTION) ×2 IMPLANT

## 2012-11-16 NOTE — Discharge Summary (Signed)
Physician Discharge Summary  Patient ID: Steven Conley MRN: [id number] DOB/AGE: 1960/12/12 52 y.o.  Admit date: 11/16/2012 Discharge date: 11/16/2012  Admission Diagnoses:  Discharge Diagnoses:  Principal Problem:   Spondylosis, cervical, with myelopathy   Discharged Condition: good  Hospital Course: Patient admitted the hospital where he underwent uncomplicated 2 level anterior cervical and decompression fusion. Postoperatively is done very well. Neck and upper  extremity symptoms much better. Ready for discharge home.  Consults:   Significant Diagnostic Studies:   Treatments:   Discharge Exam: Blood pressure 155/96, pulse 78, temperature 98.1 F (36.7 C), temperature source Oral, resp. rate 20, SpO2 94.00%. Awake and alert. Oriented and appropriate. Cranial nerve function intact. Motor and sensory function extremities normal. Wound clean and dry. Chest abdomen benign.  Disposition: 01-Home or Self Care     Medication List         atorvastatin 10 MG tablet  Commonly known as:  LIPITOR  Take 10 mg by mouth daily.     calcium carbonate 500 MG chewable tablet  Commonly known as:  TUMS - dosed in mg elemental calcium  Chew 1-4 tablets by mouth daily as needed for heartburn.     cyclobenzaprine 10 MG tablet  Commonly known as:  FLEXERIL  Take 1 tablet (10 mg total) by mouth 3 (three) times daily as needed for muscle spasms.     FORTESTA TD  Apply 40 mg topically daily. Place on upper thigh     HYDROcodone-acetaminophen 5-325 MG per tablet  Commonly known as:  NORCO/VICODIN  Take 1-2 tablets by mouth every 4 (four) hours as needed.     omeprazole 40 MG capsule  Commonly known as:  PRILOSEC  Take 40 mg by mouth daily.     VISINE OP  Apply 1-2 drops to eye 4 (four) times daily as needed (dry eyes).     vitamin B-12 1000 MCG tablet  Commonly known as:  CYANOCOBALAMIN  Take 1,000 mcg by mouth daily.     warfarin 2.5 MG tablet  Commonly known as:  COUMADIN   Take 2.5-5 mg by mouth daily. Takes 2.5 mg on Sunday and Friday and 5 mg all other days.           Follow-up Information   Follow up with Tennile Styles A, MD In 1 week. (ext 212)    Contact information:   [address] [place] [address] [phone]       Signed: Julio Sicks A 11/16/2012, 7:17 PM

## 2012-11-16 NOTE — Brief Op Note (Signed)
11/16/2012  9:53 AM  PATIENT:  [place] Constable  52 y.o. male  PRE-OPERATIVE DIAGNOSIS:  stenosis  POST-OPERATIVE DIAGNOSIS:  stenosis   PROCEDURE:  Procedure(s) with comments: ANTERIOR CERVICAL DECOMPRESSION/DISCECTOMY FUSION 2 LEVELS (N/A) - Cervical four-five, cervical five-six anterior cervical decompression fusion with PEEK and plate   SURGEON:  Surgeon(s) and Role:    * Temple Pacini, MD - Primary    * Karn Cassis, MD - Assisting  PHYSICIAN ASSISTANT:   ASSISTANTS:    ANESTHESIA:   general  EBL:  Total I/O In: 1050 [I.V.:1050] Out: 125 [Blood:125]  BLOOD ADMINISTERED:none  DRAINS: none   LOCAL MEDICATIONS USED:  NONE  SPECIMEN:  No Specimen  DISPOSITION OF SPECIMEN:  N/A  COUNTS:  YES  TOURNIQUET:  * No tourniquets in log *  DICTATION: .Dragon Dictation  PLAN OF CARE: Admit to inpatient   PATIENT DISPOSITION:  PACU - hemodynamically stable.   Delay start of Pharmacological VTE agent (>24hrs) due to surgical blood loss or risk of bleeding: yes

## 2012-11-16 NOTE — Plan of Care (Signed)
Problem: Consults Goal: Diagnosis - Spinal Surgery Outcome: Completed/Met Date Met:  11/16/12 Cervical Spine Fusion     

## 2012-11-16 NOTE — Discharge Instructions (Signed)

## 2012-11-16 NOTE — H&P (Signed)
Steven Conley is an 52 y.o. male.   Chief Complaint: Neck pain and interscapular pain. HPI: 52 year old male with persistent neck pain and interscapular pain with intermittent radiation to his upper extremity his left greater than right and with significant bilateral upper extremity numbness and paresthesias. Patient's failed conservative management. Cardiac workup was negative. Patient has undergone MRI scanning of the cervical spine which demonstrates evidence of marked spondylosis with stenosis at C4-5 and C5-6. Patient presents now for 2 level anterior cervical discectomy and fusion in hopes of improving his symptoms.  Past Medical History  Diagnosis Date  . Homozygous Factor V Leiden mutation     Dr. Shela Commons. Vanna Scotland  . Hyperlipidemia   . Hemorrhoid   . Colon polyp   . DVT (deep venous thrombosis)   . PONV (postoperative nausea and vomiting)     no problems with Propofol use  . Hypertension     not currently on BP medication  . Chronic kidney disease     acute renal failure no dialysis due to dehydration  . GERD (gastroesophageal reflux disease)   . Blood dyscrasia     Past Surgical History  Procedure Laterality Date  . Nose surgery  1972, 1985  . Fracture surgery      left ankle  . Knee arthroscopy      right  . Shoulder surgery      right  . Biceps tendon repair      right  . Vasectomy    . Hemorroidectomy  1995  . Esophagogastroduodenoscopy (egd) with propofol  03/10/2012    Procedure: ESOPHAGOGASTRODUODENOSCOPY (EGD) WITH PROPOFOL;  Surgeon: Willis Modena, MD;  Location: [facility];  Service: Endoscopy;  Laterality: N/A;  . Bravo ph study  03/10/2012    Procedure: BRAVO PH STUDY;  Surgeon: Willis Modena, MD;  Location: [facility]OS[facility];  Service: Endoscopy;  Laterality: N/A;  . Eye surgery Bilateral     lasik    Family History  Problem Relation Age of Onset  . Cancer Mother     breast  . Cancer Sister     breast  . Cancer Brother     stomach  .  Cancer Maternal Aunt     breast   Social History:  reports that he has never smoked. He has never used smokeless tobacco. He reports that  drinks alcohol. He reports that he does not use illicit drugs.  Allergies:  Allergies  Allergen Reactions  . Sulfa Drugs Cross Reactors Hives    Medications Prior to Admission  Medication Sig Dispense Refill  . atorvastatin (LIPITOR) 10 MG tablet Take 10 mg by mouth daily.       . calcium carbonate (TUMS - DOSED IN MG ELEMENTAL CALCIUM) 500 MG chewable tablet Chew 1-4 tablets by mouth daily as needed for heartburn.       Marland Kitchen omeprazole (PRILOSEC) 40 MG capsule Take 40 mg by mouth daily.      . Testosterone (FORTESTA TD) Apply 40 mg topically daily. Place on upper thigh      . Tetrahydrozoline HCl (VISINE OP) Apply 1-2 drops to eye 4 (four) times daily as needed (dry eyes).      . vitamin B-12 (CYANOCOBALAMIN) 1000 MCG tablet Take 1,000 mcg by mouth daily.      Marland Kitchen warfarin (COUMADIN) 2.5 MG tablet Take 2.5-5 mg by mouth daily. Takes 2.5 mg on Sunday and Friday and 5 mg all other days.        Results for orders placed during  the hospital encounter of 11/15/12 (from the past 48 hour(s))  SURGICAL PCR SCREEN     Status: None   Collection Time    11/15/12  2:14 PM      Result Value Range   MRSA, PCR NEGATIVE  NEGATIVE   Staphylococcus aureus NEGATIVE  NEGATIVE   Comment:            The Xpert SA Assay (FDA     approved for NASAL specimens     in patients over 64 years of age),     is one component of     a comprehensive surveillance     program.  Test performance has     been validated by [employer] for patients greater     than or equal to 27 year old.     It is not intended     to diagnose infection nor to     guide or monitor treatment.  PROTIME-INR     Status: None   Collection Time    11/15/12  2:16 PM      Result Value Range   Prothrombin Time 13.2  11.6 - 15.2 seconds   INR 1.02  0.00 - 1.49  APTT     Status: None   Collection  Time    11/15/12  2:16 PM      Result Value Range   aPTT 33  24 - 37 seconds  BASIC METABOLIC PANEL     Status: Abnormal   Collection Time    11/15/12  2:16 PM      Result Value Range   Sodium 141  135 - 145 mEq/L   Potassium 4.3  3.5 - 5.1 mEq/L   Chloride 105  96 - 112 mEq/L   CO2 28  19 - 32 mEq/L   Glucose, Bld 96  70 - 99 mg/dL   BUN 13  6 - 23 mg/dL   Creatinine, Ser 6.29  0.50 - 1.35 mg/dL   Calcium 52.8  8.4 - 41.3 mg/dL   GFR calc non Af Amer 70 (*) >90 mL/min   GFR calc Af Amer 82 (*) >90 mL/min   Comment:            The eGFR has been calculated     using the CKD EPI equation.     This calculation has not been     validated in all clinical     situations.     eGFR's persistently     <90 mL/min signify     possible Chronic Kidney Disease.  CBC WITH DIFFERENTIAL     Status: Abnormal   Collection Time    11/15/12  2:16 PM      Result Value Range   WBC 8.1  4.0 - 10.5 K/uL   RBC 5.40  4.22 - 5.81 MIL/uL   Hemoglobin 16.8  13.0 - 17.0 g/dL   HCT 24.4  01.0 - 27.2 %   MCV 86.7  78.0 - 100.0 fL   MCH 31.1  26.0 - 34.0 pg   MCHC 35.9  30.0 - 36.0 g/dL   RDW 53.6  64.4 - 03.4 %   Platelets 151  150 - 400 K/uL   Neutrophils Relative % 59  43 - 77 %   Lymphocytes Relative 25  12 - 46 %   Monocytes Relative 9  3 - 12 %   Eosinophils Relative 6 (*) 0 - 5 %   Basophils Relative 1  0 - 1 %   Neutro Abs 4.8  1.7 - 7.7 K/uL   Lymphs Abs 2.0  0.7 - 4.0 K/uL   Monocytes Absolute 0.7  0.1 - 1.0 K/uL   Eosinophils Absolute 0.5  0.0 - 0.7 K/uL   Basophils Absolute 0.1  0.0 - 0.1 K/uL   WBC Morphology ABSOLUTE LYMPHOCYTOSIS     No results found.  Review of Systems  Constitutional: Negative.   HENT: Negative.   Eyes: Negative.   Respiratory: Negative.   Cardiovascular: Negative.   Gastrointestinal: Negative.   Genitourinary: Negative.   Musculoskeletal: Negative.   Skin: Negative.   Neurological: Negative.   Endo/Heme/Allergies: Negative.   Psychiatric/Behavioral:  Negative.     Blood pressure 146/97, pulse 64, temperature 97.3 F (36.3 C), temperature source Oral, resp. rate 20, SpO2 96.00%. Physical Exam  Constitutional: He appears well-developed and well-nourished. No distress.  HENT:  Head: Normocephalic and atraumatic.  Right Ear: External ear normal.  Left Ear: External ear normal.  Nose: Nose normal.  Mouth/Throat: Oropharynx is clear and moist. No oropharyngeal exudate.  Eyes: Conjunctivae and EOM are normal. Pupils are equal, round, and reactive to light. Right eye exhibits no discharge. Left eye exhibits no discharge.  Neck: Normal range of motion. No tracheal deviation present. No thyromegaly present.  Cardiovascular: Normal rate, regular rhythm and intact distal pulses.  Exam reveals no friction rub.   No murmur heard. Respiratory: Effort normal and breath sounds normal. No stridor. No respiratory distress. He has no wheezes.  GI: Soft. Bowel sounds are normal. He exhibits no distension. There is no tenderness.  Musculoskeletal: Normal range of motion. He exhibits no edema and no tenderness.  Neurological: He is alert. He has normal reflexes. He displays normal reflexes. No cranial nerve deficit. He exhibits normal muscle tone. Coordination normal.  Decreased cervical range of motion. Positive left Spurling sign. Decreased sensation both distal upper extremities.  Skin: Skin is warm and dry. He is not diaphoretic.  Psychiatric: He has a normal mood and affect. His behavior is normal. Judgment and thought content normal.     Assessment/Plan C4-5, C5-6 spondylosis with stenosis and myelopathy. Plan C4-5 and C5-6 anterior cervical discectomy and fusion with interbody peek cages and local autograft coupled with anterior plate instrumentation. Risks and benefits have been explained. Patient wishes to proceed.  Keegan Ducey A 11/16/2012, 7:40 AM

## 2012-11-16 NOTE — Anesthesia Postprocedure Evaluation (Signed)
Anesthesia Post Note  Patient: Steven Conley  Procedure(s) Performed: Procedure(s) (LRB): ANTERIOR CERVICAL DECOMPRESSION/DISCECTOMY FUSION 2 LEVELS (N/A)  Anesthesia type: General  Patient location: PACU  Post pain: Pain level controlled and Adequate analgesia  Post assessment: Post-op Vital signs reviewed, Patient's Cardiovascular Status Stable, Respiratory Function Stable, Patent Airway and Pain level controlled  Last Vitals:  Filed Vitals:   11/16/12 1037  BP:   Pulse: 76  Temp:   Resp: 17    Post vital signs: Reviewed and stable  Level of consciousness: awake, alert  and oriented  Complications: No apparent anesthesia complications

## 2012-11-16 NOTE — Op Note (Signed)
Date of procedure: 11/16/2012  Date of dictation: Same  Service: Neurosurgery  Preoperative diagnosis: C4-5, C5-6 spondylosis with myelopathy.  Postoperative diagnosis: Same  Procedure Name: C4-5, C5-6 anterior cervical discectomy with interbody fusion utilizing peak cage x2, local autograft, and anterior plate instrumentation.  Surgeon:Gianlucca Szymborski A.Luva Metzger, M.D.  Asst. Surgeon: None  Anesthesia: General  Indication: 52 year old male with neck and bilateral N. knee symptoms left greater than right. Workup demonstrates evidence of advanced spondylosis with stenosis at C4-5 and C5-6 and resultant spinal cord compression. Patient's failed conservative management and presents now for 2 level anterior cervical decompression infusion in hopes of improving his symptoms.  Operative note: After induction of anesthesia, patient positioned supine with neck slightly extended and held in place with halter traction. Patient's anterior cervical region was prepped and draped sterilely. Incision made overlying the C5 vertebral level. This is carried down sharply to the platysma. The platysma was then divided vertically and dissection proceeded along the medial border of the sternocleidomastoid muscle and carotid sheath. Trachea and esophagus were mobilized and retracted towards the left. Prevertebral fascia was stripped off the anterior spinal column. Longus colli muscles elevated bilaterally using electrocautery. Deep self retaining retractors placed intraoperative fluoroscopy is used and the levels were confirmed. The space at C4-5 and C5-6 and then incised with 15 blade in a rectangular fashion. Y. disc space cleanout was then achieved using pituitary rongeurs forward and backward L. Carlin curettes Kerrison rongeurs the high-speed drill. All elements of the disc were removed down to the level of the posterior annulus. Microscope was then brought into the field and used throughout the remainder of the discectomy.  Remaining aspects of annulus and osteophytes were removed using a high-speed drill. Bone was harvested in use and later autografting. Posterior lunch and limb was elevated and resected in piecemeal fashion using Kerrison rongeurs. Underlying thecal sac was identified. Wide central decompression then performed by undercutting the bodies of C4 and C5. Decompression then proceeded into each neural foramen. Wide anterior foraminotomies were performed along the course exiting nerve roots out laterally. At this point a very thorough decompression had been achieved. There was no evidence of injury to thecal sac and nerve roots. Wound was then irrigated with and bike solution. Gelfoam was placed topically for hemostasis. Medtronic peek anatomic cages were then packed with morselized autograft and the cage was inserted in both the C4-5 and C5-6 levels. A Medtronic Atlantis anterior cervical plate was then placed over the C4-C5 and C6 levels. This is an attachment or fluoroscopic guidance using 13 mm variable-angle screws 2 each in all levels. All 6 screws given a final tightening down to be solidly within bone. Locking screws engaged at all 3 levels. Final images revealed good position the bone graft and hardware at the proper operative level with normal alignment of the spine. Wound is then irrigated with antibiotic irrigation. Wound is inspected for hemostasis and began and then was closed in a typical fashion. Steri-Strips and a sterile dressing were applied. There were no apparent complications. The patient tolerated the procedure well and he returns to the recovery room postop.

## 2012-11-16 NOTE — Preoperative (Signed)
Beta Blockers   Reason not to administer Beta Blockers:Not Applicable 

## 2012-11-16 NOTE — Transfer of Care (Signed)
Immediate Anesthesia Transfer of Care Note  Patient: Steven Conley  Procedure(s) Performed: Procedure(s) with comments: ANTERIOR CERVICAL DECOMPRESSION/DISCECTOMY FUSION 2 LEVELS (N/A) - Cervical four-five, cervical five-six anterior cervical decompression fusion with PEEK and plate   Patient Location: PACU  Anesthesia Type:General  Level of Consciousness: awake, alert , oriented and patient cooperative  Airway & Oxygen Therapy: Patient Spontanous Breathing and Patient connected to nasal cannula oxygen  Post-op Assessment: Report given to PACU RN, Post -op Vital signs reviewed and stable and Patient moving all extremities  Post vital signs: Reviewed and stable  Complications: No apparent anesthesia complications

## 2012-11-16 NOTE — Anesthesia Procedure Notes (Signed)
Procedure Name: Intubation Date/Time: 11/16/2012 8:00 AM Performed by: Marni Griffon Pre-anesthesia Checklist: Patient identified, Emergency Drugs available, Suction available and Patient being monitored Patient Re-evaluated:Patient Re-evaluated prior to inductionOxygen Delivery Method: Circle system utilized Intubation Type: IV induction Ventilation: Mask ventilation without difficulty Laryngoscope Size: Mac and 3 Grade View: Grade I Tube type: Oral Tube size: 7.5 mm Number of attempts: 1 Airway Equipment and Method: Stylet Placement Confirmation: ETT inserted through vocal cords under direct vision,  breath sounds checked- equal and bilateral and positive ETCO2 Secured at: 22 (cm at teeth) cm Tube secured with: Tape Dental Injury: Teeth and Oropharynx as per pre-operative assessment

## 2012-11-16 NOTE — Anesthesia Preprocedure Evaluation (Signed)
Anesthesia Evaluation  Patient identified by MRN, date of birth, ID band Patient awake    Reviewed: Allergy & Precautions, H&P , NPO status , Patient's Chart, lab work & pertinent test results  History of Anesthesia Complications (+) PONV  Airway Mallampati: II  Neck ROM: full    Dental   Pulmonary shortness of breath,          Cardiovascular hypertension, DVT     Neuro/Psych    GI/Hepatic GERD-  ,  Endo/Other  Morbid obesity  Renal/GU      Musculoskeletal   Abdominal   Peds  Hematology  (+) Blood dyscrasia, , Factor V Leiden dz   Anesthesia Other Findings   Reproductive/Obstetrics                           Anesthesia Physical Anesthesia Plan  ASA: III  Anesthesia Plan: General   Post-op Pain Management:    Induction: Intravenous  Airway Management Planned: Oral ETT  Additional Equipment:   Intra-op Plan:   Post-operative Plan: Extubation in OR  Informed Consent: I have reviewed the patients History and Physical, chart, labs and discussed the procedure including the risks, benefits and alternatives for the proposed anesthesia with the patient or authorized representative who has indicated his/her understanding and acceptance.     Plan Discussed with: Anesthesiologist and Surgeon  Anesthesia Plan Comments:         Anesthesia Quick Evaluation

## 2012-11-17 NOTE — Progress Notes (Signed)
Pt. discharged home accompanied by husband. Prescriptions and discharge instructions given with verbalization of understanding. Incision site on neck with no s/s of infection - no swelling, redness, bleeding, and/or drainage noted. Soft collar intact. Pain med given just before leaving. Opportunity given to ask questions but no question asked. Pt. transported out of this unit in wheelchair by the nurse tech  

## 2012-11-18 ENCOUNTER — Encounter (HOSPITAL_COMMUNITY): Payer: Self-pay | Admitting: Neurosurgery

## 2013-01-09 ENCOUNTER — Inpatient Hospital Stay (HOSPITAL_COMMUNITY)
Admission: EM | Admit: 2013-01-09 | Discharge: 2013-01-10 | DRG: 138 | Disposition: A | Discharge status: Home / Self Care | Payer: BC Managed Care – PPO | Attending: Cardiology | Admitting: Cardiology

## 2013-01-09 ENCOUNTER — Encounter (HOSPITAL_COMMUNITY): Payer: Self-pay | Admitting: *Deleted

## 2013-01-09 ENCOUNTER — Emergency Department (HOSPITAL_COMMUNITY): Payer: BC Managed Care – PPO

## 2013-01-09 DIAGNOSIS — E785 Hyperlipidemia, unspecified: Secondary | ICD-10-CM | POA: Diagnosis present

## 2013-01-09 DIAGNOSIS — Z7982 Long term (current) use of aspirin: Secondary | ICD-10-CM

## 2013-01-09 DIAGNOSIS — I4891 Unspecified atrial fibrillation: Secondary | ICD-10-CM

## 2013-01-09 DIAGNOSIS — D6851 Activated protein C resistance: Secondary | ICD-10-CM | POA: Diagnosis present

## 2013-01-09 DIAGNOSIS — K219 Gastro-esophageal reflux disease without esophagitis: Secondary | ICD-10-CM | POA: Diagnosis present

## 2013-01-09 DIAGNOSIS — I129 Hypertensive chronic kidney disease with stage 1 through stage 4 chronic kidney disease, or unspecified chronic kidney disease: Secondary | ICD-10-CM | POA: Diagnosis present

## 2013-01-09 DIAGNOSIS — I10 Essential (primary) hypertension: Secondary | ICD-10-CM

## 2013-01-09 DIAGNOSIS — D6859 Other primary thrombophilia: Secondary | ICD-10-CM | POA: Diagnosis present

## 2013-01-09 DIAGNOSIS — Z7901 Long term (current) use of anticoagulants: Secondary | ICD-10-CM

## 2013-01-09 DIAGNOSIS — Z86718 Personal history of other venous thrombosis and embolism: Secondary | ICD-10-CM

## 2013-01-09 DIAGNOSIS — I480 Paroxysmal atrial fibrillation: Secondary | ICD-10-CM

## 2013-01-09 DIAGNOSIS — N189 Chronic kidney disease, unspecified: Secondary | ICD-10-CM | POA: Diagnosis present

## 2013-01-09 LAB — TROPONIN I: Troponin I: <0.3 ng/mL (ref <0.30)

## 2013-01-09 LAB — PROTIME-INR
INR: 2.55 — ABNORMAL HIGH (ref 0.00–1.49)
Prothrombin Time: 26.6 seconds — ABNORMAL HIGH (ref 11.6–15.2)

## 2013-01-09 LAB — CBC
Hemoglobin: 17.6 g/dL — ABNORMAL HIGH (ref 13.0–17.0)
Platelets: 159 10*3/uL (ref 150–400)
RBC: 5.75 MIL/uL (ref 4.22–5.81)
WBC: 9.1 10*3/uL (ref 4.0–10.5)

## 2013-01-09 LAB — PRO B NATRIURETIC PEPTIDE: Pro B Natriuretic peptide (BNP): 162.7 pg/mL — ABNORMAL HIGH (ref 0–125)

## 2013-01-09 MED ORDER — WARFARIN SODIUM 2.5 MG PO TABS: 2.5000 mg | ORAL_TABLET | Freq: Every day | ORAL | Status: DC

## 2013-01-09 MED ORDER — VITAMIN B-12 1000 MCG PO TABS
1000.0000 ug | ORAL_TABLET | ORAL | Status: DC
  Administered 2013-01-10: 1000 ug via ORAL
  Filled 2013-01-09: qty 1

## 2013-01-09 MED ORDER — TESTOSTERONE 10 MG/ACT (2%) TD GEL: Freq: Every day | TRANSDERMAL | Status: DC

## 2013-01-09 MED ORDER — WARFARIN - PHARMACIST DOSING INPATIENT: Freq: Every day | Status: DC

## 2013-01-09 MED ORDER — ASPIRIN 325 MG PO TABS
325.0000 mg | ORAL_TABLET | ORAL | Status: AC
  Filled 2013-01-09: qty 1

## 2013-01-09 MED ORDER — INFLUENZA VAC SPLIT QUAD 0.5 ML IM SUSP
0.5000 mL | INTRAMUSCULAR | Status: DC
  Filled 2013-01-09: qty 0.5

## 2013-01-09 MED ORDER — ATORVASTATIN CALCIUM 10 MG PO TABS
10.0000 mg | ORAL_TABLET | Freq: Every day | ORAL | Status: DC
Start: 2013-01-09 — End: 2013-01-10
  Administered 2013-01-10: 10 mg via ORAL
  Filled 2013-01-09 (×2): qty 1

## 2013-01-09 MED ORDER — DILTIAZEM HCL 100 MG IV SOLR
5.0000 mg/h | INTRAVENOUS | Status: DC
  Administered 2013-01-09 – 2013-01-10 (×2): 5 mg/h via INTRAVENOUS
  Filled 2013-01-09: qty 100

## 2013-01-09 MED ORDER — NITROGLYCERIN 0.4 MG SL SUBL: 0.4000 mg | SUBLINGUAL_TABLET | SUBLINGUAL | Status: DC | PRN

## 2013-01-09 MED ORDER — DILTIAZEM LOAD VIA INFUSION
20.0000 mg | Freq: Once | INTRAVENOUS | Status: AC
  Administered 2013-01-09: 20 mg via INTRAVENOUS
  Filled 2013-01-09: qty 20

## 2013-01-09 MED ORDER — ACETAMINOPHEN 80 MG PO CHEW
320.0000 mg | CHEWABLE_TABLET | ORAL | Status: DC | PRN
Start: 2013-01-09 — End: 2013-01-10
  Filled 2013-01-09: qty 4

## 2013-01-09 MED ORDER — TESTOSTERONE 50 MG/5GM (1%) TD GEL: 5.0000 g | Freq: Every day | TRANSDERMAL | Status: DC

## 2013-01-09 MED ORDER — PANTOPRAZOLE SODIUM 40 MG PO TBEC
40.0000 mg | DELAYED_RELEASE_TABLET | Freq: Every day | ORAL | Status: DC
  Administered 2013-01-09 – 2013-01-10 (×2): 40 mg via ORAL
  Filled 2013-01-09 (×2): qty 1

## 2013-01-09 NOTE — Progress Notes (Signed)
Patient ID: Steven Conley, male   DOB: Feb 14, 1961, 52 y.o.   MRN: [id number]   I will make him NPO for cardioversion 9/15 AM if he has not reverted to NSR.  Jerral Bonito, MD

## 2013-01-09 NOTE — ED Notes (Signed)
Pt is here for evaluation of irregular pulse, palpitations and chest rightness this am.  Pt describes a bit of sob with this.  No n/v with this.

## 2013-01-09 NOTE — ED Notes (Signed)
HR INCREASED TO 146. CARDIZEM RATE INCREASED TO 10 MG/HR.

## 2013-01-09 NOTE — ED Provider Notes (Signed)
CSN: [id number]     Arrival date & time 01/09/13  1134 History   First MD Initiated Contact with Patient 01/09/13 1152     Chief Complaint  Patient presents with  . Palpitations    HPI  Patient presents with palpitations, lightheadedness. This episode began approximately 2 hours prior to my evaluation without clear precipitant.  Since onset he has been lightheaded, with no significant chest pain, but with ongoing palpitations. He denies new medication, diet, activity. No clear alleviating or exacerbating factors. Notably, the patient has one prior episode of similar symptoms, though that was attributed to change in kidney function with new low pressure medication. He has no recent diagnosed dysrhythmia from a nor known occlusive disease.   Past Medical History  Diagnosis Date  . Homozygous Factor V Leiden mutation     Dr. Shela Commons. Vanna Scotland  . Hyperlipidemia   . Hemorrhoid   . Colon polyp   . DVT (deep venous thrombosis)   . PONV (postoperative nausea and vomiting)     no problems with Propofol use  . Hypertension     not currently on BP medication  . Chronic kidney disease     acute renal failure no dialysis due to dehydration  . GERD (gastroesophageal reflux disease)   . Blood dyscrasia    Past Surgical History  Procedure Laterality Date  . Nose surgery  1972, 1985  . Fracture surgery      left ankle  . Knee arthroscopy      right  . Shoulder surgery      right  . Biceps tendon repair      right  . Vasectomy    . Hemorroidectomy  1995  . Esophagogastroduodenoscopy (egd) with propofol  03/10/2012    Procedure: ESOPHAGOGASTRODUODENOSCOPY (EGD) WITH PROPOFOL;  Surgeon: Willis Modena, MD;  Location: [facility];  Service: Endoscopy;  Laterality: N/A;  . Bravo ph study  03/10/2012    Procedure: BRAVO PH STUDY;  Surgeon: Willis Modena, MD;  Location: [facility];  Service: Endoscopy;  Laterality: N/A;  . Eye surgery Bilateral     lasik  . Anterior cervical  decomp/discectomy fusion N/A 11/16/2012    Procedure: ANTERIOR CERVICAL DECOMPRESSION/DISCECTOMY FUSION 2 LEVELS;  Surgeon: Temple Pacini, MD;  Location: [facility];  Service: Neurosurgery;  Laterality: N/A;  Cervical four-five, cervical five-six anterior cervical decompression fusion with PEEK and plate   . Neck surgery     Family History  Problem Relation Age of Onset  . Cancer Mother     breast  . Cancer Sister     breast  . Cancer Brother     stomach  . Cancer Maternal Aunt     breast   History  Substance Use Topics  . Smoking status: Never Smoker   . Smokeless tobacco: Never Used  . Alcohol Use: Yes     Comment: socially    Review of Systems  Constitutional:       Per HPI, otherwise negative  HENT:       Per HPI, otherwise negative  Respiratory:       Per HPI, otherwise negative  Cardiovascular:       Per HPI, otherwise negative  Gastrointestinal: Negative for vomiting.  Endocrine:       Negative aside from HPI  Genitourinary:       Neg aside from HPI   Musculoskeletal:       Per HPI, otherwise negative  Skin: Negative.   Neurological: Positive for light-headedness.  Negative for syncope.    Allergies  Sulfa drugs cross reactors  Home Medications   Current Outpatient Rx  Name  Route  Sig  Dispense  Refill  . atorvastatin (LIPITOR) 10 MG tablet   Oral   Take 10 mg by mouth daily.          . calcium carbonate (TUMS - DOSED IN MG ELEMENTAL CALCIUM) 500 MG chewable tablet   Oral   Chew 1-4 tablets by mouth daily as needed for heartburn.          . cyclobenzaprine (FLEXERIL) 10 MG tablet   Oral   Take 1 tablet (10 mg total) by mouth 3 (three) times daily as needed for muscle spasms.   30 tablet   0   . HYDROcodone-acetaminophen (NORCO/VICODIN) 5-325 MG per tablet   Oral   Take 1-2 tablets by mouth every 4 (four) hours as needed.   60 tablet   0   . omeprazole (PRILOSEC) 40 MG capsule   Oral   Take 40 mg by mouth daily.         .  Testosterone (FORTESTA TD)   Topical   Apply 40 mg topically daily. Place on upper thigh         . Tetrahydrozoline HCl (VISINE OP)   Ophthalmic   Apply 1-2 drops to eye 4 (four) times daily as needed (dry eyes).         . vitamin B-12 (CYANOCOBALAMIN) 1000 MCG tablet   Oral   Take 1,000 mcg by mouth daily.         Marland Kitchen warfarin (COUMADIN) 2.5 MG tablet   Oral   Take 2.5-5 mg by mouth daily. Takes 2.5 mg on Sunday and Friday and 5 mg all other days.          BP 126/98  Pulse 144  Temp(Src) 97.9 F (36.6 C) (Oral)  Resp 18  Ht 5\' 9"  (1.753 m)  Wt 198 lb (89.812 kg)  BMI 29.23 kg/m2  SpO2 93% Physical Exam  Nursing note and vitals reviewed. Constitutional: He is oriented to person, place, and time. He appears well-developed. No distress.  HENT:  Head: Normocephalic and atraumatic.  Eyes: Conjunctivae and EOM are normal.  Cardiovascular: An irregularly irregular rhythm present. Tachycardia present.   Pulmonary/Chest: Effort normal. No stridor. No respiratory distress.  Abdominal: He exhibits no distension.  Musculoskeletal: He exhibits no edema.  Neurological: He is alert and oriented to person, place, and time.  Skin: Skin is warm and dry.  Psychiatric: He has a normal mood and affect.    ED Course  Procedures (including critical care time) Labs Review Labs Reviewed  CBC - Abnormal; Notable for the following:    Hemoglobin 17.6 (*)    All other components within normal limits  PRO B NATRIURETIC PEPTIDE  TROPONIN I  PROTIME-INR   Imaging Review Dg Chest Port 1 View  01/09/2013   *RADIOLOGY REPORT*  Clinical Data: Palpitations, chest pain  PORTABLE CHEST - 1 VIEW  Comparison: None.  Findings: The heart size and vascular pattern are normal.  The lungs are clear.  IMPRESSION: Negative   Original Report Authenticated By: Esperanza Heir, M.D.   pulse oximetry 97% room air normal Cardiac monitor is 141 tachycardic, irregular, abnormal  Immediately after the  initial evaluation, with clear evidence of atrial fibrillation with rapid ventricular response fluid resuscitation was started, and diltiazem drip was started.  EKG has atrial fibrillation with RVR, rate 144, abnormal and notably changed  from prior.  Update: Diltiazem at 5, repeat EKG has atrial fibrillation, rate 76, abnormal but improved.  Update: Labs largely unremarkable.  Update: Patient remains symptomatic, though no additional complaints. MDM  No diagnosis found.   Patient presents with palpitations, lightheadedness, mild dyspnea and chest tightness.  On exam he is notable dysrhythmia with atrial fibrillation with rapid ventricular response.  Patient has no history of dysrhythmia. However, the patient is already anticoagulated with Coumadin due to hypercoagulable state. Following initial resuscitation with fluids, the patient had Cardizem drip initiated.  This is substantially reduced the heart rate, though he remained in dysrhythmia. With new atrial fibrillation the patient required admission for further evaluation and management. I discussed the patient's case with our cardiology colleagues for admission.  CRITICAL CARE Performed by: Gerhard Munch Total critical care time: 35 Critical care time was exclusive of separately billable procedures and treating other patients. Critical care was necessary to treat or prevent imminent or life-threatening deterioration. Critical care was time spent personally by me on the following activities: development of treatment plan with patient and/or surrogate as well as nursing, discussions with consultants, evaluation of patient's response to treatment, examination of patient, obtaining history from patient or surrogate, ordering and performing treatments and interventions, ordering and review of laboratory studies, ordering and review of radiographic studies, pulse oximetry and re-evaluation of patient's condition.   Gerhard Munch,  MD 01/09/13 [phone]

## 2013-01-09 NOTE — Progress Notes (Signed)
ANTICOAGULATION CONSULT NOTE - Initial Consult  Pharmacy Consult for warfarin Indication: atrial fibrillation  Allergies  Allergen Reactions  . Sulfa Drugs Cross Reactors Hives    Patient Measurements: Height: 5\' 9"  (175.3 cm) Weight: 198 lb (89.812 kg) IBW/kg (Calculated) : 70.7   Vital Signs: Temp: 97.9 F (36.6 C) (09/14 1142) Temp src: Oral (09/14 1142) BP: 132/91 mmHg (09/14 1434) Pulse Rate: 74 (09/14 1434)  Labs:  Recent Labs  01/09/13 1158 01/09/13 1226  HGB 17.6*  --   HCT 49.4  --   PLT 159  --   LABPROT  --  26.6*  INR  --  2.55*  TROPONINI <0.30  --     Estimated Creatinine Clearance: 81.3 ml/min (by C-G formula based on Cr of 1.15).   Medical History: Past Medical History  Diagnosis Date  . Homozygous Factor V Leiden mutation     Dr. Shela Commons. Vanna [place]  . Hyperlipidemia   . Hemorrhoid   . Colon polyp   . DVT (deep venous thrombosis)   . PONV (postoperative nausea and vomiting)     no problems with Propofol use  . Hypertension     not currently on BP medication  . Chronic kidney disease     acute renal failure no dialysis due to dehydration  . GERD (gastroesophageal reflux disease)   . Blood dyscrasia     Assessment: 62 YOM with history of Factor V Leiden mutation and AFib on warfarin PTA. His dose is 2.5 daily except 5mg  Mondays and Fridays. His INR today is 2.55. He reports taking his warfarin today,  Goal of Therapy:  INR 2-3 Monitor platelets by anticoagulation protocol: Yes   Plan:  1. No warfarin tonight as patient has already received 2. Daily INR 3. Follow up, likely will be able to restart home warfarin 9/15 4. Follow for signs/symptoms bleeding  Elizah Lydon D. Isaic Syler, PharmD Clinical Pharmacist Pager: [phone] 01/09/2013 3:54 PM

## 2013-01-09 NOTE — H&P (Signed)
CARDIOLOGY HISTORY AND PHYSICAL   Patient ID: Steven Conley MRN: [id number]  DOB/AGE: 1960/06/23 52 y.o. Admit date: 01/09/2013  Primary Care Physician: Lillia Mountain, MD Primary Cardiologist:  Narda Rutherford  Clinical Summary [name] is a 52 y.o.male who presented today to the emergency room with rapid atrial fibrillation. He does not drink excess caffeine. Thyroid functions are being checked. He was hospitalized with an episode of dehydration in May, 2014. He had some renal dysfunction at that time. There were no documented arrhythmias. He felt poorly with a rapid atrial fib today and came to the emergency room. He is already anticoagulated for a history of factor V Leiden and episodes of deep venous thrombosis in the past.  The patient is very active. He works vigorously in many capacities and has no significant limiting factors.   Allergies  Allergen Reactions  . Sulfa Drugs Cross Reactors Hives    Home Medications  (Not in a hospital admission)  Scheduled Medications . aspirin  325 mg Oral STAT     Infusions . diltiazem (CARDIZEM) infusion 10 mg/hr (01/09/13 1415)     PRN Medications    Past Medical History  Diagnosis Date  . Homozygous Factor V Leiden mutation     Dr. Shela Commons. Vanna [place]  . Hyperlipidemia   . Hemorrhoid   . Colon polyp   . DVT (deep venous thrombosis)   . PONV (postoperative nausea and vomiting)     no problems with Propofol use  . Hypertension     not currently on BP medication  . Chronic kidney disease     acute renal failure no dialysis due to dehydration  . GERD (gastroesophageal reflux disease)   . Blood dyscrasia     Past Surgical History  Procedure Laterality Date  . Nose surgery  1972, 1985  . Fracture surgery      left ankle  . Knee arthroscopy      right  . Shoulder surgery      right  . Biceps tendon repair      right  . Vasectomy    . Hemorroidectomy  1995  . Esophagogastroduodenoscopy (egd)  with propofol  03/10/2012    Procedure: ESOPHAGOGASTRODUODENOSCOPY (EGD) WITH PROPOFOL;  Surgeon: Willis Modena, MD;  Location: [facility];  Service: Endoscopy;  Laterality: N/A;  . Bravo ph study  03/10/2012    Procedure: BRAVO PH STUDY;  Surgeon: Willis Modena, MD;  Location: [facility];  Service: Endoscopy;  Laterality: N/A;  . Eye surgery Bilateral     lasik  . Anterior cervical decomp/discectomy fusion N/A 11/16/2012    Procedure: ANTERIOR CERVICAL DECOMPRESSION/DISCECTOMY FUSION 2 LEVELS;  Surgeon: Temple Pacini, MD;  Location: [facility];  Service: Neurosurgery;  Laterality: N/A;  Cervical four-five, cervical five-six anterior cervical decompression fusion with PEEK and plate   . Neck surgery      Family History  Problem Relation Age of Onset  . Cancer Mother     breast  . Cancer Sister     breast  . Cancer Brother     stomach  . Cancer Maternal Aunt     breast    Social History [name] reports that he has never smoked. He has never used smokeless tobacco. [name] reports that  drinks alcohol.  Review of Systems  Patient denies fever, chills, headache, sweats, rash, change in vision, change in hearing, chest pain, cough, nausea vomiting, urinary symptoms. All other systems are reviewed and are negative.  Physical Examination Temp:  [97.9 F (36.6 C)] 97.9 F (36.6 C) (09/14 1142) Pulse Rate:  [65-144] 74 (09/14 1434) Resp:  [13-22] 20 (09/14 1434) BP: (117-138)/(75-107) 132/91 mmHg (09/14 1434) SpO2:  [93 %-98 %] 93 % (09/14 1434) Weight:  [198 lb (89.812 kg)] 198 lb (89.812 kg) (09/14 1142)  Patient is oriented to person time and place. Affect is normal. He's here with his wife. He is tanned. There is no jugulovenous distention. Lungs are clear. Respiratory effort is nonlabored. Cardiac exam her vitals S1 and S2. There no clicks or significant murmurs. The abdomen is soft. There is no peripheral edema. There no musculoskeletal deformities. There are no  skin rashes.   Lab Results  Basic Metabolic Panel: No results found for this basename: NA, K, CL, CO2, GLUCOSE, BUN, CREATININE, CALCIUM, MG, PHOS,  in the last 168 hours the remainder of his admission labs are pending.  Liver Function Tests: No results found for this basename: AST, ALT, ALKPHOS, BILITOT, PROT, ALBUMIN,  in the last 168 hours  CBC:  Recent Labs Lab 01/09/13 1158  WBC 9.1  HGB 17.6*  HCT 49.4  MCV 85.9  PLT 159    Cardiac Enzymes:  Recent Labs Lab 01/09/13 1158  TROPONINI <0.30    BNP: No components found with this basename: POCBNP,    Radiology Dg Chest Port 1 View  01/09/2013   *RADIOLOGY REPORT*  Clinical Data: Palpitations, chest pain  PORTABLE CHEST - 1 VIEW  Comparison: None.  Findings: The heart size and vascular pattern are normal.  The lungs are clear.  IMPRESSION: Negative   Original Report Authenticated By: Esperanza Heir, M.D.     ECG  I personally reviewed his EKG. There is rapid atrial fibrillation. There are mild nonspecific ST-T wave changes.   Impression and Recommendations    Factor V Leiden mutation     The patient is heterozygous factor V Leiden. Because he has had deep venous thrombosis in the past he is coumadinized. His INR and admission is 2.5.    HTN (hypertension)     He had some renal dysfunction when he became dehydrated. His ACE inhibitor was stopped. He is on IV Cardizem now. Oral Cardizem will be a very good drug for him in the future for both blood pressure and control of heart rate if he has recurrent atrial fibrillation.    GERD (gastroesophageal reflux disease)     This has been evaluated in carefully treated by his primary team.    Atrial fibrillation with RVR     Patient is now admitted with new onset rapid atrial fibrillation with rapid rate. We have no prior evidence at this has occurred in the past. TSH will be checked. Two-dimensional echo will be done. The patient is fully anticoagulated. If he has not  converted to sinus rhythm as the day goes on tomorrow, I will proceed with heart eversion in the hospital.     Warfarin anticoagulation     Coumadin therapy will be continued.  Signed: Willa Rough 01/09/2013, 3:43 PM

## 2013-01-09 NOTE — ED Notes (Signed)
Cardiology at bedside.

## 2013-01-10 DIAGNOSIS — I517 Cardiomegaly: Secondary | ICD-10-CM

## 2013-01-10 LAB — BASIC METABOLIC PANEL
CO2: 24 mEq/L (ref 19–32)
Calcium: 9 mg/dL (ref 8.4–10.5)
Chloride: 106 mEq/L (ref 96–112)
Glucose, Bld: 108 mg/dL — ABNORMAL HIGH (ref 70–99)
Potassium: 3.7 mEq/L (ref 3.5–5.1)
Sodium: 141 mEq/L (ref 135–145)

## 2013-01-10 LAB — CBC
HCT: 46.4 % (ref 39.0–52.0)
Hemoglobin: 16.1 g/dL (ref 13.0–17.0)
MCV: 86.4 fL (ref 78.0–100.0)
RBC: 5.37 MIL/uL (ref 4.22–5.81)
WBC: 8.9 10*3/uL (ref 4.0–10.5)

## 2013-01-10 LAB — PROTIME-INR
INR: 2.36 — ABNORMAL HIGH (ref 0.00–1.49)
Prothrombin Time: 25 seconds — ABNORMAL HIGH (ref 11.6–15.2)

## 2013-01-10 LAB — MAGNESIUM: Magnesium: 1.9 mg/dL (ref 1.5–2.5)

## 2013-01-10 MED ORDER — DILTIAZEM HCL ER COATED BEADS 180 MG PO CP24
180.0000 mg | ORAL_CAPSULE | Freq: Every day | ORAL | Status: DC
  Administered 2013-01-10: 180 mg via ORAL
  Filled 2013-01-10: qty 1

## 2013-01-10 MED ORDER — SODIUM CHLORIDE 0.9 % IV SOLN
INTRAVENOUS | Status: DC
  Administered 2013-01-09: 19:00:00 via INTRAVENOUS

## 2013-01-10 MED ORDER — DILTIAZEM HCL 30 MG PO TABS
30.0000 mg | ORAL_TABLET | Freq: Four times a day (QID) | ORAL | Status: DC
  Filled 2013-01-10 (×4): qty 1

## 2013-01-10 MED ORDER — DILTIAZEM HCL ER COATED BEADS 180 MG PO CP24: 180.0000 mg | ORAL_CAPSULE | Freq: Every day | ORAL | Status: DC

## 2013-01-10 NOTE — Discharge Summary (Signed)
CARDIOLOGY DISCHARGE SUMMARY   Patient ID: Steven Conley MRN: [id number] DOB/AGE: 1960/10/17 52 y.o.  Admit date: 01/09/2013 Discharge date: 01/10/2013  Primary Discharge Diagnosis:     Atrial fibrillation with RVR Secondary Discharge Diagnosis:  Active Problems:   Factor V Leiden mutation   HTN (hypertension)   GERD (gastroesophageal reflux disease)   Warfarin anticoagulation  Procedures:  2D Echocardiogram  Hospital Course: Steven Conley is a 52 y.o. male with no history of CAD or arrhythmia.  He had tachy-palpitations and came to the ER. He was in rapid atrial fibrillation and was admitted for further evaluation and treatment.   Initially, he was started on IV Cardizem for rate control. His heart rate improved on the IV Cardizem. He had no chest pain or SOB. His CXR was not acute. His other labs, including a TSH were reviewed and were OK. His platelet count was slightly low, but he has no bleeding issues.   He is already on coumadin for a history of DVT. He is to continue this. His level was therapeutic.  Overnight, he spontaneously converted to SR. He had no more palpitations. He was changed from IV to oral Cardizem and tolerated this well.   On 01/10/2013 he was seen by Dr Myrtis Ser. He was ambulating without chest pain or SOB and considered stable for discharge, in improved condition, to follow up as an outpatient.    Labs:  Lab Results  Component Value Date   WBC 8.9 01/10/2013   HGB 16.1 01/10/2013   HCT 46.4 01/10/2013   MCV 86.4 01/10/2013   PLT 141* 01/10/2013     Recent Labs Lab 01/10/13 0605  NA 141  K 3.7  CL 106  CO2 24  BUN 16  CREATININE 1.00  CALCIUM 9.0  GLUCOSE 108*    Recent Labs  01/09/13 1158  TROPONINI <0.30   Pro B Natriuretic peptide (BNP)  Date/Time Value Range Status  01/09/2013 11:58 AM 162.7* 0 - 125 pg/mL Final    Recent Labs  01/10/13 0605  INR 2.36*   Lab Results  Component Value Date   TSH 1.545 01/10/2013        Radiology: Dg Chest Port 1 View 01/09/2013   *RADIOLOGY REPORT*  Clinical Data: Palpitations, chest pain  PORTABLE CHEST - 1 VIEW  Comparison: None.  Findings: The heart size and vascular pattern are normal.  The lungs are clear.  IMPRESSION: Negative   Original Report Authenticated By: Esperanza Heir, M.D.   EKG: 01/10/2013 Sinus brady Vent. rate 58 BPM PR interval 154 ms QRS duration 100 ms QT/QTc 446/437 ms P-R-T axes 19 51 82  Echo: 01/10/2013 Study Conclusions Left ventricle: The cavity size was normal. Wall thickness was increased in a pattern of mild LVH. Systolic function was normal. The estimated ejection fraction was in the range of 55% to 60%. Wall motion was normal; there were no regional wall motion abnormalities. Doppler parameters are consistent with abnormal left ventricular relaxation (grade 1 diastolic dysfunction).    FOLLOW UP PLANS AND APPOINTMENTS Allergies  Allergen Reactions  . Sulfa Drugs Cross Reactors Hives     Medication List         acetaminophen 500 MG chewable tablet  Commonly known as:  TYLENOL  Chew 1,000 mg by mouth daily as needed for pain.     atorvastatin 10 MG tablet  Commonly known as:  LIPITOR  Take 10 mg by mouth daily.     diltiazem 180 MG 24 hr capsule  Commonly known as:  CARDIZEM CD  Take 1 capsule (180 mg total) by mouth daily.     FORTESTA TD  Apply 40 mg topically daily. Place on upper thigh     omeprazole 40 MG capsule  Commonly known as:  PRILOSEC  Take 40 mg by mouth daily.     vitamin B-12 1000 MCG tablet  Commonly known as:  CYANOCOBALAMIN  Take 1,000 mcg by mouth every Monday, Wednesday, and Friday.     warfarin 2.5 MG tablet  Commonly known as:  COUMADIN  Take 2.5-5 mg by mouth daily. Takes 5mg  on Monday and Friday. Takes 2.5mg  on all other days        Discharge Orders   Future Appointments Provider Department Dept Phone   01/17/2013 3:45 PM Luis Abed, MD [facility] [place])  [phone]   Future Orders Complete By Expires   Diet - low sodium heart healthy  As directed    Increase activity slowly  As directed      Follow-up Information   Follow up with Willa Rough, MD On 01/17/2013. (at 3:45 pm)    Specialty:  Cardiology   Contact information:   [address] [place] [address] [phone]       BRING ALL MEDICATIONS WITH YOU TO FOLLOW UP APPOINTMENTS  Time spent with patient to include physician time: 35 min Signed: Theodore Demark, PA-C 01/10/2013, 1:25 PM Co-Sign MD Patient seen and examined. I agree with the assessment and plan as detailed above. See also my additional thoughts below.   Refer to my progress note from today also. I made the decision for the patient to go home. I agree with the plans above.  Willa Rough, MD, [facility] 01/10/2013 3:19 PM .

## 2013-01-10 NOTE — Progress Notes (Addendum)
Patient ID: Steven Conley, male   DOB: 10-14-1960, 52 y.o.   MRN: [id number]   SUBJECTIVE: Patient converted to sinus rhythm spontaneously last night. He remained stable. There is no evidence of myocardial infarction. Two-dimensional echo has just been completed. It is not ready to be read officially. However I have reviewed the images. There is some left atrial hypertrophy. Ejection fraction is 60-65%. There no focal wall motion abnormalities. There no bowel or abnormalities.   Filed Vitals:   01/10/13 0400 01/10/13 0600 01/10/13 0700 01/10/13 0804  BP: 126/83 111/63    Pulse: 65 63 59   Temp:    98.5 F (36.9 C)  TempSrc: Oral   Oral  Resp: 17 15 21    Height:      Weight:      SpO2: 96%       Intake/Output Summary (Last 24 hours) at 01/10/13 0950 Last data filed at 01/10/13 0600  Gross per 24 hour  Intake 272.58 ml  Output      0 ml  Net 272.58 ml    LABS: Basic Metabolic Panel:  Recent Labs  65/78/46 0605  NA 141  K 3.7  CL 106  CO2 24  GLUCOSE 108*  BUN 16  CREATININE 1.00  CALCIUM 9.0  MG 1.9   Liver Function Tests: No results found for this basename: AST, ALT, ALKPHOS, BILITOT, PROT, ALBUMIN,  in the last 72 hours No results found for this basename: LIPASE, AMYLASE,  in the last 72 hours CBC:  Recent Labs  01/09/13 1158 01/10/13 0605  WBC 9.1 8.9  HGB 17.6* 16.1  HCT 49.4 46.4  MCV 85.9 86.4  PLT 159 141*   Cardiac Enzymes:  Recent Labs  01/09/13 1158  TROPONINI <0.30   BNP: No components found with this basename: POCBNP,  D-Dimer: No results found for this basename: DDIMER,  in the last 72 hours Hemoglobin A1C: No results found for this basename: HGBA1C,  in the last 72 hours Fasting Lipid Panel: No results found for this basename: CHOL, HDL, LDLCALC, TRIG, CHOLHDL, LDLDIRECT,  in the last 72 hours Thyroid Function Tests: No results found for this basename: TSH, T4TOTAL, FREET3, T3FREE, THYROIDAB,  in the last 72  hours  RADIOLOGY: Dg Chest Port 1 View  01/09/2013   *RADIOLOGY REPORT*  Clinical Data: Palpitations, chest pain  PORTABLE CHEST - 1 VIEW  Comparison: None.  Findings: The heart size and vascular pattern are normal.  The lungs are clear.  IMPRESSION: Negative   Original Report Authenticated By: Esperanza Heir, M.D.    PHYSICAL EXAM  patient is oriented to person time and place. Affect is normal. Lungs are clear. Respiratory effort is nonlabored. Cardiac exam her vitals S1 and S2. The rhythm is regular. The abdomen is soft. There is no peripheral edema.   TELEMETRY: I have reviewed telemetry today January 10, 2013. There is normal sinus rhythm.   ASSESSMENT AND PLAN:    Factor V Leiden mutation      Patient is heterozygous. However he has had deep venous thrombosis. Therefore he continues on Coumadin.    HTN (hypertension)     The pressure is controlled at this time here today. I will be adding Cardizem long-acting 180 mg daily.    GERD (gastroesophageal reflux disease)    Atrial fibrillation with RVR        Patient converted spontaneously to sinus rhythm. TSH is in progress. He is already coumadinized for other reasons. He has normal left ventricular  function and no valvular abnormalities. He'll be discharged home with followup with me. We will also arrange for a stress nuclear scan because he has had chest pain.    Warfarin anticoagulation     This will continue. He takes this for factor V Leiden heterozygote scant evidence of DVT in the past.  Chest discomfort   History the patient has had some chest pain. It did not lead to the current admission. However we need to assess him further to rule out the possibility of ischemia. He recently had neck surgery. He is not yet being allowed to exercise vigorously. We will arrange for an exercise test at a later date.  The patient is ready to go home. We will followup on his TSH. He will have an outpatient stress nuclear scan scheduled. He  will go home on long-acting diltiazem 180 mg daily. I will see him for followup in the office.  Willa Rough 01/10/2013 9:50 AM

## 2013-01-10 NOTE — Progress Notes (Signed)
  Echocardiogram 2D Echocardiogram has been performed.  Steven Conley 01/10/2013, 9:45 AM

## 2013-01-10 NOTE — Care Management Note (Signed)
    Page 1 of 1   01/10/2013     9:48:58 AM   CARE MANAGEMENT NOTE 01/10/2013  Patient:  Steven Conley, Steven Conley   Account Number:  [id number]  Date Initiated:  01/10/2013  Documentation initiated by:  Junius Creamer  Subjective/Objective Assessment:   adm w at fib w rvr     Action/Plan:   lives w wife, pcp dr Jonny Ruiz griffin   Anticipated DC Date:     Anticipated DC Plan:  HOME/SELF CARE      DC Planning Services  CM consult      Choice offered to / List presented to:             Status of service:   Medicare Important Message given?   (If response is "NO", the following Medicare IM given date fields will be blank) Date Medicare IM given:   Date Additional Medicare IM given:    Discharge Disposition:    Per UR Regulation:  Reviewed for med. necessity/level of care/duration of stay  If discussed at Long Length of Stay Meetings, dates discussed:    Comments:

## 2013-01-16 ENCOUNTER — Encounter: Payer: Self-pay | Admitting: Cardiology

## 2013-01-16 DIAGNOSIS — R9430 Abnormal result of cardiovascular function study, unspecified: Secondary | ICD-10-CM | POA: Insufficient documentation

## 2013-01-17 ENCOUNTER — Encounter: Payer: Self-pay | Admitting: Cardiology

## 2013-01-17 ENCOUNTER — Ambulatory Visit (INDEPENDENT_AMBULATORY_CARE_PROVIDER_SITE_OTHER): Payer: BC Managed Care – PPO | Admitting: Cardiology

## 2013-01-17 VITALS — BP 148/86 | HR 70 | Ht 69.0 in | Wt 205.0 lb

## 2013-01-17 DIAGNOSIS — I10 Essential (primary) hypertension: Secondary | ICD-10-CM

## 2013-01-17 DIAGNOSIS — Z7901 Long term (current) use of anticoagulants: Secondary | ICD-10-CM

## 2013-01-17 DIAGNOSIS — R0789 Other chest pain: Secondary | ICD-10-CM

## 2013-01-17 DIAGNOSIS — I4891 Unspecified atrial fibrillation: Secondary | ICD-10-CM

## 2013-01-17 NOTE — Assessment & Plan Note (Signed)
Patient takes Coumadin for his history of factor V Leiden and DVTs.

## 2013-01-17 NOTE — Assessment & Plan Note (Signed)
He's holding sinus rhythm at this time. He does not need anticoagulation. No other medicines other than diltiazem for rate control if needed if he reverts to atrial fib.

## 2013-01-17 NOTE — Assessment & Plan Note (Signed)
The patient did have some chest discomfort at the time of his atrial fibrillation. It is now time to proceed with a full-strength study.

## 2013-01-17 NOTE — Patient Instructions (Addendum)
Your physician has requested that you have en exercise stress myoview. For further information please visit [email or web address] Please follow instruction sheet, as given.  Your physician recommends that you continue on your current medications as directed. Please refer to the Current Medication list given to you today.  Your physician recommends that you schedule a follow-up appointment in: 6 weeks

## 2013-01-17 NOTE — Assessment & Plan Note (Signed)
Patient currently has reasonable control on diltiazem. ACE inhibitor is on hold since his episode of dehydration earlier this year with some renal dysfunction. He has had a few elevated pressures at home. Pressure in the office today is stable.

## 2013-01-17 NOTE — Progress Notes (Signed)
HPI  The patient is seen for post hospital visit. I recently saw him in the hospital when he presented with rapid atrial fibrillation. He converted to sinus after approximately 12 hours spontaneously. Cardiac enzymes are negative. Two-dimensional echo showed excellent LV function. He was allowed to go home. His blood pressure was mildly elevated. I added diltiazem to help with rate control if he had recurrent atrial fib and for blood pressure. Earlier in the year he had an episode of dehydration with some renal dysfunction. His ACE inhibitor has been held since that time. He did have some mild chest discomfort with his atrial fib.  Since being at home he is completely stable. He has been taking his blood pressure. There has been some increasing pressure but it is stable here in the office today. He has not had any recurrent atrial fibrillation.  Allergies  Allergen Reactions  . Sulfa Drugs Cross Reactors Hives    Current Outpatient Prescriptions  Medication Sig Dispense Refill  . acetaminophen (TYLENOL) 500 MG chewable tablet Chew 1,000 mg by mouth daily as needed for pain.      Marland Kitchen atorvastatin (LIPITOR) 10 MG tablet Take 10 mg by mouth daily.       Marland Kitchen diltiazem (CARDIZEM CD) 180 MG 24 hr capsule Take 1 capsule (180 mg total) by mouth daily.  30 capsule  11  . omeprazole (PRILOSEC) 40 MG capsule Take 40 mg by mouth daily.      . Testosterone (FORTESTA TD) Apply 40 mg topically daily. Place on upper thigh      . vitamin B-12 (CYANOCOBALAMIN) 1000 MCG tablet Take 1,000 mcg by mouth every Monday, Wednesday, and Friday.       . warfarin (COUMADIN) 2.5 MG tablet Take 2.5-5 mg by mouth daily. Takes 5mg  on Monday and Friday. Takes 2.5mg  on all other days       No current facility-administered medications for this visit.    History   Social History  . Marital Status: Married    Spouse Name: N/A    Number of Children: N/A  . Years of Education: N/A   Occupational History  . Not on file.    Social History Main Topics  . Smoking status: Never Smoker   . Smokeless tobacco: Never Used  . Alcohol Use: Yes     Comment: socially  . Drug Use: No  . Sexual Activity: Yes   Other Topics Concern  . Not on file   Social History Narrative  . No narrative on file    Family History  Problem Relation Age of Onset  . Cancer Mother     breast  . Cancer Sister     breast  . Cancer Brother     stomach  . Cancer Maternal Aunt     breast    Past Medical History  Diagnosis Date  . Homozygous Factor V Leiden mutation     Dr. Shela Commons. [place]anna [place]  . Hyperlipidemia   . Hemorrhoid   . Colon polyp   . DVT (deep venous thrombosis)   . PONV (postoperative nausea and vomiting)     no problems with Propofol use  . Hypertension     not currently on BP medication  . Chronic kidney disease     acute renal failure no dialysis due to dehydration  . GERD (gastroesophageal reflux disease)   . Blood dyscrasia   . Ejection fraction     .    Past Surgical History  Procedure Laterality Date  .  Nose surgery  1972, 1985  . Fracture surgery      left ankle  . Knee arthroscopy      right  . Shoulder surgery      right  . Biceps tendon repair      right  . Vasectomy    . Hemorroidectomy  1995  . Esophagogastroduodenoscopy (egd) with propofol  03/10/2012    Procedure: ESOPHAGOGASTRODUODENOSCOPY (EGD) WITH PROPOFOL;  Surgeon: Willis Modena, MD;  Location: [facility];  Service: Endoscopy;  Laterality: N/A;  . Bravo ph study  03/10/2012    Procedure: BRAVO PH STUDY;  Surgeon: Willis Modena, MD;  Location: [facility];  Service: Endoscopy;  Laterality: N/A;  . Eye surgery Bilateral     lasik  . Anterior cervical decomp/discectomy fusion N/A 11/16/2012    Procedure: ANTERIOR CERVICAL DECOMPRESSION/DISCECTOMY FUSION 2 LEVELS;  Surgeon: Temple Pacini, MD;  Location: [facility];  Service: Neurosurgery;  Laterality: N/A;  Cervical four-five, cervical five-six anterior cervical  decompression fusion with PEEK and plate   . Neck surgery      Patient Active Problem List   Diagnosis Date Noted  . Ejection fraction   . Atrial fibrillation with RVR 01/09/2013  . Warfarin anticoagulation 01/09/2013  . Spondylosis, cervical, with myelopathy 11/16/2012  . Factor V Leiden mutation 09/07/2012  . ARF (acute renal failure) 09/07/2012  . SOB (shortness of breath) 09/07/2012  . Chest discomfort 09/07/2012  . HTN (hypertension) 09/07/2012  . GERD (gastroesophageal reflux disease) 09/07/2012  . HLD (hyperlipidemia) 09/07/2012  . History of DVT of lower extremity 09/07/2012  . Internal hemorrhoids with complication 01/27/2011    ROS   Patient denies fever, chills, headache, sweats, rash, change in vision, change in hearing,, cough, nausea vomiting, urinary symptoms. All other systems are reviewed and are negative.  PHYSICAL EXAM  Patient is oriented to person time and place. Affect is normal. There is no jugulovenous distention. Lungs are clear. Respiratory effort is nonlabored. Cardiac exam reveals S1 and S2. There no clicks or significant murmurs. The abdomen is soft. There is no peripheral edema.     Filed Vitals:   01/17/13 1549  BP: 148/86  Pulse: 70  Height: 5\' 9"  (1.753 m)  Weight: 205 lb (92.987 kg)   EKG is done today and reviewed by me. Patient is holding sinus rhythm. EKG is normal.  ASSESSMENT & PLAN

## 2013-01-31 ENCOUNTER — Ambulatory Visit (HOSPITAL_COMMUNITY): Payer: BC Managed Care – PPO | Attending: Cardiology | Admitting: Radiology

## 2013-01-31 VITALS — BP 150/86 | Ht 69.0 in | Wt 199.0 lb

## 2013-01-31 DIAGNOSIS — I10 Essential (primary) hypertension: Secondary | ICD-10-CM | POA: Insufficient documentation

## 2013-01-31 DIAGNOSIS — R0789 Other chest pain: Secondary | ICD-10-CM | POA: Insufficient documentation

## 2013-01-31 DIAGNOSIS — E785 Hyperlipidemia, unspecified: Secondary | ICD-10-CM | POA: Insufficient documentation

## 2013-01-31 DIAGNOSIS — R0602 Shortness of breath: Secondary | ICD-10-CM

## 2013-01-31 DIAGNOSIS — Z8249 Family history of ischemic heart disease and other diseases of the circulatory system: Secondary | ICD-10-CM | POA: Insufficient documentation

## 2013-01-31 DIAGNOSIS — I4891 Unspecified atrial fibrillation: Secondary | ICD-10-CM | POA: Insufficient documentation

## 2013-01-31 MED ORDER — TECHNETIUM TC 99M SESTAMIBI GENERIC - CARDIOLITE
11.0000 | Freq: Once | INTRAVENOUS | Status: AC | PRN
  Administered 2013-01-31: 11 via INTRAVENOUS

## 2013-01-31 MED ORDER — TECHNETIUM TC 99M SESTAMIBI GENERIC - CARDIOLITE
33.0000 | Freq: Once | INTRAVENOUS | Status: AC | PRN
  Administered 2013-01-31: 33 via INTRAVENOUS

## 2013-01-31 NOTE — Progress Notes (Signed)
[facility] SITE 3 NUCLEAR MED [address] [place], [place] [address] [phone]    Cardiology Nuclear Med Study  Steven Conley is a 52 y.o. male     MRN : [id number]     DOB: 02/23/1961  Procedure Date: 01/31/2013  Nuclear Med Background Indication for Stress Test:  Evaluation for Ischemia and Post Hospital:Atrial Fib with (-) enzymes History:  9/14 Atrial Fib;Echo:55-65% Cardiac Risk Factors: Family History - CAD, Hypertension and Lipids  Symptoms:  Chest Tightness    Nuclear Pre-Procedure Caffeine/Decaff Intake:  None NPO After: 8:30am   Lungs:  clear O2 Sat: 96% on room air. IV 0.9% NS with Angio Cath:  20g  IV Site: R Hand  IV Started by:  Cathlyn Parsons, RN  Chest Size (in):  42 Cup Size: n/a  Height: 5\' 9"  (1.753 m)  Weight:  199 lb (90.266 kg)  BMI:  Body mass index is 29.37 kg/(m^2). Tech Comments:  n/a    Nuclear Med Study 1 or 2 day study: 1 day  Stress Test Type:  Stress  Reading MD: Olga Millers, MD  Order Authorizing Provider:  Effie Shy  Resting Radionuclide: Technetium 80m Sestamibi  Resting Radionuclide Dose: 11.0 mCi   Stress Radionuclide:  Technetium 16m Sestamibi  Stress Radionuclide Dose: 33.0 mCi           Stress Protocol Rest HR: 62 Stress HR: 150  Rest BP: 150/86 Stress BP: 208/96  Exercise Time (min): 11:00 METS: 13.40   Predicted Max HR: 166 bpm % Max HR: 90.36 bpm Rate Pressure Product: 64403   Dose of Adenosine (mg):  n/a Dose of Lexiscan: n/a mg  Dose of Atropine (mg): n/a Dose of Dobutamine: n/a mcg/kg/min (at max HR)  Stress Test Technologist: Milana Na, EMT-P  Nuclear Technologist:  Doyne Keel, CNMT     Rest Procedure:  Myocardial perfusion imaging was performed at rest 45 minutes following the intravenous administration of Technetium 53m Sestamibi. Rest ECG: NSR - Normal EKG  Stress Procedure:  The patient exercised on the treadmill utilizing the Bruce Protocol for 11:00 minutes. The patient  stopped due to sob, lt. headed, and denied any chest pain.  Technetium 74m Sestamibi was injected at peak exercise and myocardial perfusion imaging was performed after a brief delay. Stress ECG: Insignificant upsloping ST segment depression.  QPS Raw Data Images:  Acquisition technically good; normal left ventricular size. Stress Images:  There is decreased uptake in the inferior wall. Rest Images:  There is decreased uptake in the inferior wall. Subtraction (SDS):  No evidence of ischemia. Transient Ischemic Dilatation (Normal <1.22): NA Lung/Heart Ratio (Normal <0.45):  0.40  Quantitative Gated Spect Images QGS EDV:  119 ml QGS ESV:  47 ml  Impression Exercise Capacity:  Good exercise capacity. BP Response:  Hypertensive blood pressure response. Clinical Symptoms:  There is dyspnea. ECG Impression:  No significant ST segment change suggestive of ischemia. Comparison with Prior Nuclear Study: No previous nuclear study performed  Overall Impression:  Normal stress nuclear study with a small, mild, fixed basal inferior defect consistent with thinning; no ischemia.  LV Ejection Fraction: 61%.  LV Wall Motion:  NL LV Function; NL Wall Motion  Olga Millers

## 2013-02-03 ENCOUNTER — Encounter: Payer: Self-pay | Admitting: Cardiology

## 2013-03-11 ENCOUNTER — Ambulatory Visit (INDEPENDENT_AMBULATORY_CARE_PROVIDER_SITE_OTHER): Payer: BC Managed Care – PPO | Admitting: Cardiology

## 2013-03-11 ENCOUNTER — Ambulatory Visit: Payer: BC Managed Care – PPO | Admitting: Cardiology

## 2013-03-11 ENCOUNTER — Encounter: Payer: Self-pay | Admitting: Cardiology

## 2013-03-11 VITALS — BP 138/90 | HR 61 | Ht 69.0 in | Wt 204.0 lb

## 2013-03-11 DIAGNOSIS — D6859 Other primary thrombophilia: Secondary | ICD-10-CM

## 2013-03-11 DIAGNOSIS — I4891 Unspecified atrial fibrillation: Secondary | ICD-10-CM

## 2013-03-11 DIAGNOSIS — R0789 Other chest pain: Secondary | ICD-10-CM

## 2013-03-11 DIAGNOSIS — I10 Essential (primary) hypertension: Secondary | ICD-10-CM

## 2013-03-11 DIAGNOSIS — D6851 Activated protein C resistance: Secondary | ICD-10-CM

## 2013-03-11 DIAGNOSIS — Z7901 Long term (current) use of anticoagulants: Secondary | ICD-10-CM

## 2013-03-11 NOTE — Assessment & Plan Note (Signed)
He's had no return of chest pain. His nuclear scan October, 2014 revealed no scar or ischemia. Ejection fraction is normal. No further workup.

## 2013-03-11 NOTE — Patient Instructions (Signed)
Your physician recommends that you continue on your current medications as directed. Please refer to the Current Medication list given to you today.  Your physician wants you to follow-up in: 1 year. You will receive a reminder letter in the mail two months in advance. If you don't receive a letter, please call our office to schedule the follow-up appointment.  

## 2013-03-11 NOTE — Assessment & Plan Note (Signed)
On diltiazem and off lisinopril the patient's diastolic pressure however is near 90. He is about to begin W. some weight with exercise. I raised the question of restarting his lisinopril. He is a little hesitant. I've encouraged him to remain on diltiazem and to go about his return to exercise. He will then followup with his primary physician to see if any other antihypertensives need to be re\re added.

## 2013-03-11 NOTE — Assessment & Plan Note (Signed)
He takes Coumadin for factor V Leiden. This makes the decision easy concerning the potential use of anticoagulation for paroxysmal atrial fibrillation.

## 2013-03-11 NOTE — Assessment & Plan Note (Signed)
Patient is on long-term Coumadin for this.

## 2013-03-11 NOTE — Assessment & Plan Note (Signed)
He has not had any return of atrial fibrillation. For now I left him on Cardizem to help with rate control if he were to have another episode of atrial fib. Also this medication helps with his blood pressure.

## 2013-03-11 NOTE — Progress Notes (Signed)
HPI  Patient is seen today to followup his episode of atrial fib and mild chest discomfort. I saw him in the office January 17, 2013. He then had a nuclear scan in October, 2014. Ejection fraction was normal. There was no evidence of scar or ischemia. Today he seen back in he is doing well. He's not having any palpitations and he is not having any chest discomfort. He continues to recover from his neck surgery.  Allergies  Allergen Reactions  . Sulfa Drugs Cross Reactors Hives    Current Outpatient Prescriptions  Medication Sig Dispense Refill  . acetaminophen (TYLENOL) 500 MG chewable tablet Chew 1,000 mg by mouth daily as needed for pain.      Marland Kitchen atorvastatin (LIPITOR) 10 MG tablet Take 10 mg by mouth daily.       Marland Kitchen diltiazem (CARDIZEM CD) 180 MG 24 hr capsule Take 1 capsule (180 mg total) by mouth daily.  30 capsule  11  . omeprazole (PRILOSEC) 40 MG capsule Take 40 mg by mouth daily.      . Testosterone (FORTESTA TD) Apply 40 mg topically daily. Place on upper thigh      . vitamin B-12 (CYANOCOBALAMIN) 1000 MCG tablet Take 1,000 mcg by mouth every Monday, Wednesday, and Friday.       . warfarin (COUMADIN) 2.5 MG tablet Take 2.5-5 mg by mouth daily. Takes 5mg  on Monday and Friday. Takes 2.5mg  on all other days       No current facility-administered medications for this visit.    History   Social History  . Marital Status: Married    Spouse Name: N/A    Number of Children: N/A  . Years of Education: N/A   Occupational History  . Not on file.   Social History Main Topics  . Smoking status: Never Smoker   . Smokeless tobacco: Never Used  . Alcohol Use: Yes     Comment: socially  . Drug Use: No  . Sexual Activity: Yes   Other Topics Concern  . Not on file   Social History Narrative  . No narrative on file    Family History  Problem Relation Age of Onset  . Cancer Mother     breast  . Cancer Sister     breast  . Cancer Brother     stomach  . Cancer Maternal  Aunt     breast    Past Medical History  Diagnosis Date  . Homozygous Factor V Leiden mutation     Dr. Shela Commons. [place]anna [place]  . Hyperlipidemia   . Hemorrhoid   . Colon polyp   . DVT (deep venous thrombosis)   . PONV (postoperative nausea and vomiting)     no problems with Propofol use  . Hypertension     not currently on BP medication  . Chronic kidney disease     acute renal failure no dialysis due to dehydration  . GERD (gastroesophageal reflux disease)   . Blood dyscrasia   . Ejection fraction     .    Past Surgical History  Procedure Laterality Date  . Nose surgery  1972, 1985  . Fracture surgery      left ankle  . Knee arthroscopy      right  . Shoulder surgery      right  . Biceps tendon repair      right  . Vasectomy    . Hemorroidectomy  1995  . Esophagogastroduodenoscopy (egd) with propofol  03/10/2012  Procedure: ESOPHAGOGASTRODUODENOSCOPY (EGD) WITH PROPOFOL;  Surgeon: Willis Modena, MD;  Location: [facility];  Service: Endoscopy;  Laterality: N/A;  . Bravo ph study  03/10/2012    Procedure: BRAVO PH STUDY;  Surgeon: Willis Modena, MD;  Location: [facility];  Service: Endoscopy;  Laterality: N/A;  . Eye surgery Bilateral     lasik  . Anterior cervical decomp/discectomy fusion N/A 11/16/2012    Procedure: ANTERIOR CERVICAL DECOMPRESSION/DISCECTOMY FUSION 2 LEVELS;  Surgeon: Temple Pacini, MD;  Location: [facility];  Service: Neurosurgery;  Laterality: N/A;  Cervical four-five, cervical five-six anterior cervical decompression fusion with PEEK and plate   . Neck surgery      Patient Active Problem List   Diagnosis Date Noted  . Ejection fraction   . Atrial fibrillation with RVR 01/09/2013  . Warfarin anticoagulation 01/09/2013  . Spondylosis, cervical, with myelopathy 11/16/2012  . Factor V Leiden mutation 09/07/2012  . ARF (acute renal failure) 09/07/2012  . SOB (shortness of breath) 09/07/2012  . Chest discomfort 09/07/2012  . HTN  (hypertension) 09/07/2012  . GERD (gastroesophageal reflux disease) 09/07/2012  . HLD (hyperlipidemia) 09/07/2012  . History of DVT of lower extremity 09/07/2012  . Internal hemorrhoids with complication 01/27/2011    ROS   Patient denies fever, chills, headache, sweats, rash, change in vision, change in hearing, chest pain, cough, nausea or vomiting, urinary symptoms. All other systems are reviewed and are negative.  PHYSICAL EXAM  Patient is oriented to person time and place. Affect is normal. There is no jugulovenous distention. Lungs are clear. Respiratory effort is nonlabored. Cardiac exam reveals S1 and S2. There no clicks or significant murmurs. The abdomen is soft. There is no peripheral edema.  Filed Vitals:   03/11/13 1158  BP: 138/90  Pulse: 61  Height: 5\' 9"  (1.753 m)  Weight: 204 lb (92.534 kg)  SpO2: 98%    EKG  ASSESSMENT & PLAN

## 2013-04-28 DIAGNOSIS — G4730 Sleep apnea, unspecified: Secondary | ICD-10-CM

## 2013-04-28 HISTORY — DX: Sleep apnea, unspecified: G47.30

## 2014-01-04 ENCOUNTER — Other Ambulatory Visit: Payer: Self-pay

## 2014-01-04 MED ORDER — DILTIAZEM HCL ER COATED BEADS 180 MG PO CP24: 180.0000 mg | ORAL_CAPSULE | Freq: Every day | ORAL | Status: DC

## 2014-03-09 ENCOUNTER — Encounter (HOSPITAL_COMMUNITY): Payer: Self-pay

## 2014-03-09 ENCOUNTER — Other Ambulatory Visit (HOSPITAL_COMMUNITY): Payer: Self-pay | Admitting: Orthopedic Surgery

## 2014-03-09 ENCOUNTER — Emergency Department (HOSPITAL_COMMUNITY)
Admission: EM | Admit: 2014-03-09 | Discharge: 2014-03-09 | Discharge status: Against Medical Advice | Payer: BC Managed Care – PPO | Source: Home / Self Care

## 2014-03-09 ENCOUNTER — Ambulatory Visit (HOSPITAL_COMMUNITY)
Admission: RE | Admit: 2014-03-09 | Discharge: 2014-03-09 | Disposition: A | Discharge status: Home / Self Care | Payer: BC Managed Care – PPO | Source: Ambulatory Visit | Attending: Orthopedic Surgery | Admitting: Orthopedic Surgery

## 2014-03-09 DIAGNOSIS — M79604 Pain in right leg: Secondary | ICD-10-CM | POA: Diagnosis not present

## 2014-03-09 DIAGNOSIS — M7989 Other specified soft tissue disorders: Secondary | ICD-10-CM | POA: Insufficient documentation

## 2014-03-09 NOTE — ED Notes (Addendum)
Pt has a hx of dvt's, has been taking coumadin. Went off of last week for a colonoscopy and usually bridges with lovenox but did not this time. Is concerned he has a blood clot in his right leg again. Knee extremely swollen since 1100 this morning and hurts. No known injury. Pt has been to Dr. Randel Pigg office and had xrays done today. Was sent here for doppler study

## 2014-03-09 NOTE — Progress Notes (Signed)
VASCULAR LAB PRELIMINARY  PRELIMINARY  PRELIMINARY  PRELIMINARY  Right lower extremity venous duplex completed.    Preliminary report:  Right:  No evidence of acute DVT, superficial thrombosis, or Baker's cyst.  Steven Conley, RVT 03/09/2014, 4:23 PM

## 2014-03-14 ENCOUNTER — Other Ambulatory Visit (INDEPENDENT_AMBULATORY_CARE_PROVIDER_SITE_OTHER): Payer: Self-pay | Admitting: General Surgery

## 2014-03-14 ENCOUNTER — Other Ambulatory Visit (INDEPENDENT_AMBULATORY_CARE_PROVIDER_SITE_OTHER): Payer: Self-pay

## 2014-03-14 DIAGNOSIS — R1011 Right upper quadrant pain: Secondary | ICD-10-CM

## 2014-03-15 ENCOUNTER — Encounter: Payer: Self-pay | Admitting: Cardiology

## 2014-03-15 ENCOUNTER — Ambulatory Visit (INDEPENDENT_AMBULATORY_CARE_PROVIDER_SITE_OTHER): Payer: BC Managed Care – PPO | Admitting: Cardiology

## 2014-03-15 VITALS — BP 120/76 | HR 65 | Ht 69.0 in | Wt 188.8 lb

## 2014-03-15 DIAGNOSIS — R0789 Other chest pain: Secondary | ICD-10-CM

## 2014-03-15 DIAGNOSIS — I10 Essential (primary) hypertension: Secondary | ICD-10-CM

## 2014-03-15 DIAGNOSIS — I480 Paroxysmal atrial fibrillation: Secondary | ICD-10-CM

## 2014-03-15 DIAGNOSIS — I4891 Unspecified atrial fibrillation: Secondary | ICD-10-CM

## 2014-03-15 DIAGNOSIS — Z7901 Long term (current) use of anticoagulants: Secondary | ICD-10-CM

## 2014-03-15 DIAGNOSIS — D6851 Activated protein C resistance: Secondary | ICD-10-CM

## 2014-03-15 NOTE — Assessment & Plan Note (Signed)
The patient is heterozygous. However he has had deep venous thrombosis. Coumadin was recommended by his consultants in [place]. I do not know if one of the newer agents is considered to be effective for factor V Leiden.

## 2014-03-15 NOTE — Assessment & Plan Note (Signed)
His blood pressure is controlled. I feel it is important to continue his diltiazem for both his blood pressure and to help with rate control if he has an episode of atrial fib. No change in therapy.

## 2014-03-15 NOTE — Assessment & Plan Note (Signed)
The patient continues on warfarin. His INR has been therapeutic and not elevated. I'm concerned about the bleed that he's had into his right leg. Anticoagulation is indicated for his combination of atrial fibrillation and factor V Leiden with a history of deep venous thrombosis. I told him today that I could not assess whether or not one of the new or anticoagulants can be safely used with factor V Leiden. This will have to be assessed by the consultants that he is seen in the past concerning this issue. I did tell him that there are many advantages to the new anticoagulants. However, I do not think that he would be at lower risk of having a spontaneous bleed ( such as the current problem with his knee and leg) on a newer agent. I did explain that there is decreased risk of CNS bleeding with a newer agents. No change will be made at this time. I encouraged him to discuss this fully with his primary physician.  As part of today's evaluation I spent greater than 25 minutes with the patient. More than half of this time was spent with direct counseling. We had an extensive discussion about the choice of anticoagulants. We also had an extensive discussion about the continued use of diltiazem. I explained at length that I feel that it's a good drug for both his blood pressure and to reduce his heart rate if he has any recurring atrial fibrillation.

## 2014-03-15 NOTE — Assessment & Plan Note (Signed)
Historically he had rapid atrial fibrillation September, 2014. He had some chest tightness with this. He had spontaneous conversion to sinus rhythm after 12 hours. His overall atrial fib risk score is 1. However he has factor V Leiden. This has been assessed in [facility]. Also he has had some DVT in the past. Coumadin has been recommended for his factor V Leiden. Therefore I feel it is appropriate to use Coumadin for his atrial fibrillation.

## 2014-03-15 NOTE — Patient Instructions (Signed)
Your physician recommends that you continue on your current medications as directed. Please refer to the Current Medication list given to you today.  Your physician wants you to follow-up in: 1 year. You will receive a reminder letter in the mail two months in advance. If you don't receive a letter, please call our office to schedule the follow-up appointment.  

## 2014-03-15 NOTE — Progress Notes (Signed)
Patient ID: Steven Conley, male   DOB: [birth year], 53 y.o.   MRN: [id number]    HPI Patient is seen to follow-up history of mild chest discomfort at the time of an episode of atrial fibrillation. Workup at that time revealed a normal nuclear scan in October, 2014. Ejection fraction was normal. There was no scar or ischemia. The patient's atrial fib risk score is 1. However he has factor V Leiden and Coumadin has already been recommended. Therefore the decision is easy concerning using Coumadin for his atrial fib also at this time. Recently he had some bleeding in his right leg. There was no clear-cut trauma. However his exercise level had increased. We know that his INR was not increased at that time.  He thinks he may have had some very brief episodes of atrial fib over the past year. However these have been quite limited.  Allergies  Allergen Reactions  . Sulfa Drugs Cross Reactors Hives    Current Outpatient Prescriptions  Medication Sig Dispense Refill  . acetaminophen (TYLENOL) 500 MG chewable tablet Chew 1,000 mg by mouth daily as needed for pain.    Marland Kitchen atorvastatin (LIPITOR) 10 MG tablet Take 10 mg by mouth daily.     Marland Kitchen diltiazem (CARDIZEM CD) 180 MG 24 hr capsule Take 1 capsule (180 mg total) by mouth daily. 30 capsule 3  . omeprazole (PRILOSEC) 40 MG capsule Take 40 mg by mouth daily.    Marland Kitchen testosterone (ANDROGEL) 50 MG/5GM (1%) GEL Place 5 g onto the skin daily.    . vitamin B-12 (CYANOCOBALAMIN) 1000 MCG tablet Take 1,000 mcg by mouth every Monday, Wednesday, and Friday.     . warfarin (COUMADIN) 2.5 MG tablet Take 2.5-5 mg by mouth daily. Takes 5mg  on Monday.Takes 2.5mg  on all other days     No current facility-administered medications for this visit.    History   Social History  . Marital Status: Married    Spouse Name: N/A    Number of Children: N/A  . Years of Education: N/A   Occupational History  . Not on file.   Social History Main Topics  . Smoking status:  Never Smoker   . Smokeless tobacco: Never Used  . Alcohol Use: Yes     Comment: socially  . Drug Use: No  . Sexual Activity: Yes   Other Topics Concern  . Not on file   Social History Narrative    Family History  Problem Relation Age of Onset  . Cancer Mother     breast  . Cancer Sister     breast  . Cancer Brother     stomach  . Cancer Maternal Aunt     breast    Past Medical History  Diagnosis Date  . Homozygous Factor V Leiden mutation     Dr. Lenna Sciara. [name]  . Hyperlipidemia   . Hemorrhoid   . Colon polyp   . DVT (deep venous thrombosis)   . PONV (postoperative nausea and vomiting)     no problems with Propofol use  . Hypertension     not currently on BP medication  . Chronic kidney disease     acute renal failure no dialysis due to dehydration  . GERD (gastroesophageal reflux disease)   . Blood dyscrasia   . Ejection fraction     .    Past Surgical History  Procedure Laterality Date  . Nose surgery  1972, 1985  . Fracture surgery      left  ankle  . Knee arthroscopy      right  . Shoulder surgery      right  . Biceps tendon repair      right  . Vasectomy    . Hemorroidectomy  1995  . Esophagogastroduodenoscopy (egd) with propofol  03/10/2012    Procedure: ESOPHAGOGASTRODUODENOSCOPY (EGD) WITH PROPOFOL;  Surgeon: Arta Silence, MD;  Location: [facility];  Service: Endoscopy;  Laterality: N/A;  . Bravo ph study  03/10/2012    Procedure: BRAVO [place] STUDY;  Surgeon: Arta Silence, MD;  Location: [facility];  Service: Endoscopy;  Laterality: N/A;  . Eye surgery Bilateral     lasik  . Anterior cervical decomp/discectomy fusion N/A 11/16/2012    Procedure: ANTERIOR CERVICAL DECOMPRESSION/DISCECTOMY FUSION 2 LEVELS;  Surgeon: Charlie Pitter, MD;  Location: [facility];  Service: Neurosurgery;  Laterality: N/A;  Cervical four-five, cervical five-six anterior cervical decompression fusion with PEEK and plate   . Neck surgery      Patient  Active Problem List   Diagnosis Date Noted  . Ejection fraction   . Atrial fibrillation with RVR 01/09/2013  . Warfarin anticoagulation 01/09/2013  . Spondylosis, cervical, with myelopathy 11/16/2012  . Factor V Leiden mutation 09/07/2012  . ARF (acute renal failure) 09/07/2012  . SOB (shortness of breath) 09/07/2012  . Chest discomfort 09/07/2012  . HTN (hypertension) 09/07/2012  . GERD (gastroesophageal reflux disease) 09/07/2012  . HLD (hyperlipidemia) 09/07/2012  . History of DVT of lower extremity 09/07/2012  . Internal hemorrhoids with complication 32/44/0102    ROS  Patient denies fever, chills, headache, sweats, rash, change in vision, change in hearing, chest pain, cough, nausea or vomiting, urinary symptoms. All other systems are reviewed and are negative other than the history of present illness.  PHYSICAL EXAM Patient is oriented to person time and place. Affect is normal. He has an Ace bandage on his right knee. He has ecchymoses in the skin above and below the knee. Head is atraumatic. Sclera and conjunctiva are normal. There is no jugulovenous distention. Lungs are clear. Respiratory effort is not labored. Cardiac exam reveals S1 and S2. Abdomen is soft. There is no peripheral edema. There are no musculoskeletal deformities other than his right knee. There are no skin rashes other than the ecchymoses in his right leg.  Filed Vitals:   03/15/14 0810  BP: 120/76  Pulse: 65  Height: 5\' 9"  (1.753 m)  Weight: 188 lb 12.8 oz (85.639 kg)   EKG is done today and reviewed by me. There is normal sinus rhythm. He has mild increased voltage. There is no significant change from the past.  ASSESSMENT & PLAN

## 2014-03-15 NOTE — Assessment & Plan Note (Signed)
He has not had any recurrent chest discomfort in the past year. No further workup is needed.

## 2014-03-21 ENCOUNTER — Ambulatory Visit
Admission: RE | Admit: 2014-03-21 | Discharge: 2014-03-21 | Disposition: A | Discharge status: Home / Self Care | Payer: BC Managed Care – PPO | Source: Ambulatory Visit | Attending: General Surgery | Admitting: General Surgery

## 2014-03-21 DIAGNOSIS — R1011 Right upper quadrant pain: Secondary | ICD-10-CM

## 2014-03-29 ENCOUNTER — Telehealth (INDEPENDENT_AMBULATORY_CARE_PROVIDER_SITE_OTHER): Payer: Self-pay

## 2014-03-29 DIAGNOSIS — R1011 Right upper quadrant pain: Secondary | ICD-10-CM

## 2014-03-29 NOTE — Telephone Encounter (Signed)
Called pt with abd. U/s results showing the GB is fine per Dr Donne Hazel. I asked if pt was still having the RUQ pain and he replied yes it comes/goes. I advised pt that I was going to place order in epic for the [place] scan. Pt was asking if Dr Chrissie Noa has been in touch with Dr Paulita Fujita to talk about wether or not the polyp would need to be removed the same time as the appendectomy and hernia repair. I advised pt that I would get back in touch with him once we get the [place] scan b/c he will need to have a f/u visit with Dr Donne Hazel. The pt asked if there was anyway to get all of this testing and surgery scheduled in December. I advised pt that I could not promise anything about surgery b/c its the OR's that are running out of time for the doctors to add surgery in them. I advised pt that his test could be scheduled along with the f/u visit but right now I really doubt the surgery unless if there is a cancelation.

## 2014-04-07 ENCOUNTER — Ambulatory Visit (HOSPITAL_COMMUNITY)
Admission: RE | Admit: 2014-04-07 | Discharge: 2014-04-07 | Disposition: A | Discharge status: Home / Self Care | Payer: BC Managed Care – PPO | Source: Ambulatory Visit | Attending: General Surgery | Admitting: General Surgery

## 2014-04-07 DIAGNOSIS — R1011 Right upper quadrant pain: Secondary | ICD-10-CM | POA: Diagnosis not present

## 2014-04-07 MED ORDER — TECHNETIUM TC 99M MEBROFENIN IV KIT
5.0000 | PACK | Freq: Once | INTRAVENOUS | Status: AC | PRN
  Administered 2014-04-07: 5 via INTRAVENOUS

## 2014-04-07 MED ORDER — STERILE WATER FOR INJECTION IJ SOLN
INTRAMUSCULAR | Status: AC
  Administered 2014-04-07: 10 mL
  Filled 2014-04-07: qty 10

## 2014-04-07 MED ORDER — SINCALIDE 5 MCG IJ SOLR
INTRAMUSCULAR | Status: AC
  Administered 2014-04-07: 1.71 ug via INTRAVENOUS
  Filled 2014-04-07: qty 5

## 2014-04-07 MED ORDER — SINCALIDE 5 MCG IJ SOLR
0.0200 ug/kg | Freq: Once | INTRAMUSCULAR | Status: AC
  Administered 2014-04-07: 1.71 ug via INTRAVENOUS

## 2014-04-13 ENCOUNTER — Other Ambulatory Visit (INDEPENDENT_AMBULATORY_CARE_PROVIDER_SITE_OTHER): Payer: Self-pay | Admitting: General Surgery

## 2014-04-18 ENCOUNTER — Encounter (HOSPITAL_COMMUNITY): Payer: Self-pay

## 2014-04-18 ENCOUNTER — Encounter (HOSPITAL_COMMUNITY)
Admission: RE | Admit: 2014-04-18 | Discharge: 2014-04-18 | Disposition: A | Discharge status: Home / Self Care | Payer: BC Managed Care – PPO | Source: Ambulatory Visit | Attending: General Surgery | Admitting: General Surgery

## 2014-04-18 DIAGNOSIS — Z01812 Encounter for preprocedural laboratory examination: Secondary | ICD-10-CM | POA: Insufficient documentation

## 2014-04-18 HISTORY — DX: Malignant (primary) neoplasm, unspecified: C80.1

## 2014-04-18 HISTORY — DX: Cardiac arrhythmia, unspecified: I49.9

## 2014-04-18 HISTORY — DX: Sleep apnea, unspecified: G47.30

## 2014-04-18 LAB — BASIC METABOLIC PANEL
Anion gap: 9 (ref 5–15)
BUN: 13 mg/dL (ref 6–23)
CALCIUM: 9.8 mg/dL (ref 8.4–10.5)
CO2: 27 mmol/L (ref 19–32)
Chloride: 104 mEq/L (ref 96–112)
Creatinine, Ser: 1.05 mg/dL (ref 0.50–1.35)
GFR calc Af Amer: >90 mL/min (ref >90)
GFR calc non Af Amer: 78 mL/min — ABNORMAL LOW (ref >90)
GLUCOSE: 124 mg/dL — AB (ref 70–99)
Potassium: 3.8 mmol/L (ref 3.5–5.1)
Sodium: 140 mmol/L (ref 135–145)

## 2014-04-18 LAB — CBC WITH DIFFERENTIAL/PLATELET
BASOS ABS: 0 10*3/uL (ref 0.0–0.1)
Basophils Relative: 0 % (ref 0–1)
EOS ABS: 0.4 10*3/uL (ref 0.0–0.7)
EOS PCT: 5 % (ref 0–5)
HCT: 48.1 % (ref 39.0–52.0)
Hemoglobin: 16.6 g/dL (ref 13.0–17.0)
LYMPHS ABS: 1.5 10*3/uL (ref 0.7–4.0)
Lymphocytes Relative: 19 % (ref 12–46)
MCH: 30.9 pg (ref 26.0–34.0)
MCHC: 34.5 g/dL (ref 30.0–36.0)
MCV: 89.4 fL (ref 78.0–100.0)
MONO ABS: 0.4 10*3/uL (ref 0.1–1.0)
Monocytes Relative: 5 % (ref 3–12)
Neutro Abs: 5.4 10*3/uL (ref 1.7–7.7)
Neutrophils Relative %: 71 % (ref 43–77)
Platelets: 162 10*3/uL (ref 150–400)
RBC: 5.38 MIL/uL (ref 4.22–5.81)
RDW: 13.4 % (ref 11.5–15.5)
WBC: 7.7 10*3/uL (ref 4.0–10.5)

## 2014-04-18 LAB — APTT: aPTT: 41 seconds — ABNORMAL HIGH (ref 24–37)

## 2014-04-18 LAB — PROTIME-INR
INR: 1.99 — ABNORMAL HIGH (ref 0.00–1.49)
PROTHROMBIN TIME: 22.7 s — AB (ref 11.6–15.2)

## 2014-04-18 NOTE — Pre-Procedure Instructions (Signed)
ERASTO SLEIGHT  04/18/2014   Your procedure is scheduled on:  04/25/2014  Tuesday  Report to [facility] Admitting    ENTRANCE  A   at  5:30 AM.  Call this number if you have problems the morning of surgery: [phone]   Remember:   Do not eat food or drink liquids after midnight.   On MONDAY   Take these medicines the morning of surgery with A SIP OF WATER: Diltiazem, omeprazole   Do not wear jewelry   Do not wear lotions, powders, or perfumes. You may wear deodorant.    Men may shave face and neck.   Do not bring valuables to the hospital.  [facility] is not responsible                  for any belongings or valuables.               Contacts, dentures or bridgework may not be worn into surgery.  Leave suitcase in the car. After surgery it may be brought to your room.  For patients admitted to the hospital, discharge time is determined by your                treatment team.               Patients discharged the day of surgery will not be allowed to drive  home.  Name and phone number of your driver: with family  Special Instructions: Special Instructions: [place] [facility] - Preparing for Surgery  Before surgery, you can play an important role.  Because skin is not sterile, your skin needs to be as free of germs as possible.  You can reduce the number of germs on you skin by washing with CHG (chlorahexidine gluconate) soap before surgery.  CHG is an antiseptic cleaner which kills germs and bonds with the skin to continue killing germs even after washing.  Please DO NOT use if you have an allergy to CHG or antibacterial soaps.  If your skin becomes reddened/irritated stop using the CHG and inform your nurse when you arrive at Short Stay.  Do not shave (including legs and underarms) for at least 48 hours prior to the first CHG shower.  You may shave your face.  Please follow these instructions carefully:   1.  Shower with CHG Soap the night before surgery and the   morning of Surgery.  2.  If you choose to wash your hair, wash your hair first as usual with your  normal shampoo.  3.  After you shampoo, rinse your hair and body thoroughly to remove the  Shampoo.  4.  Use CHG as you would any other liquid soap.  You can apply chg directly to the skin and wash gently with scrungie or a clean washcloth.  5.  Apply the CHG Soap to your body ONLY FROM THE NECK DOWN.    Do not use on open wounds or open sores.  Avoid contact with your eyes, ears, mouth and genitals (private parts).  Wash genitals (private parts)   with your normal soap.  6.  Wash thoroughly, paying special attention to the area where your surgery will be performed.  7.  Thoroughly rinse your body with warm water from the neck down.  8.  DO NOT shower/wash with your normal soap after using and rinsing off   the CHG Soap.  9.  Pat yourself dry with a clean towel.  10.  Wear clean pajamas.            11.  Place clean sheets on your bed the night of your first shower and do not sleep with pets.  Day of Surgery  Do not apply any lotions/deodorants the morning of surgery.  Please wear clean clothes to the hospital/surgery center.   Please read over the following fact sheets that you were given: Pain Booklet, Coughing and Deep Breathing and Surgical Site Infection Prevention

## 2014-04-18 NOTE — Progress Notes (Signed)
Pt. aware of need to hold coumadin after Wed. Dose. 12/23

## 2014-04-24 MED ORDER — CEFAZOLIN SODIUM-DEXTROSE 2-3 GM-% IV SOLR
2.0000 g | INTRAVENOUS | Status: AC
  Administered 2014-04-25: 2 g via INTRAVENOUS
  Filled 2014-04-24: qty 50

## 2014-04-24 NOTE — Anesthesia Preprocedure Evaluation (Addendum)
Anesthesia Evaluation  Patient identified by MRN, date of birth, ID band Patient awake    Reviewed: Allergy & Precautions, H&P , NPO status , Patient's Chart, lab work & pertinent test results, reviewed documented beta blocker date and time   History of Anesthesia Complications (+) PONV  Airway Mallampati: II  TM Distance: >3 FB Neck ROM: Full    Dental  (+) Teeth Intact, Dental Advisory Given   Pulmonary sleep apnea ,  Does not use CPAP mask regularly and has lost 25 lbs. breath sounds clear to auscultation        Cardiovascular hypertension, Pt. on medications + dysrhythmias Rhythm:Regular  ECHO 2014 EF 55%, Stress 2014 normal Hx at fib with spont conversion    Neuro/Psych    GI/Hepatic GERD-  Controlled and Medicated,  Endo/Other    Renal/GU      Musculoskeletal   Abdominal (+)  Abdomen: soft.    Peds  Hematology Leiden Factor 5 disorder   Anesthesia Other Findings   Reproductive/Obstetrics                           Anesthesia Physical Anesthesia Plan  ASA: III  Anesthesia Plan: General   Post-op Pain Management:    Induction: Intravenous  Airway Management Planned: Oral ETT  Additional Equipment:   Intra-op Plan:   Post-operative Plan: Extubation in OR  Informed Consent: I have reviewed the patients History and Physical, chart, labs and discussed the procedure including the risks, benefits and alternatives for the proposed anesthesia with the patient or authorized representative who has indicated his/her understanding and acceptance.     Plan Discussed with:   Anesthesia Plan Comments:         Anesthesia Quick Evaluation

## 2014-04-25 ENCOUNTER — Encounter (HOSPITAL_COMMUNITY)
Admission: RE | Disposition: A | Discharge status: Home / Self Care | Payer: Self-pay | Source: Ambulatory Visit | Attending: General Surgery

## 2014-04-25 ENCOUNTER — Ambulatory Visit (HOSPITAL_COMMUNITY)
Admission: RE | Admit: 2014-04-25 | Discharge: 2014-04-25 | Disposition: A | Discharge status: Home / Self Care | Payer: BC Managed Care – PPO | Source: Ambulatory Visit | Attending: General Surgery | Admitting: General Surgery

## 2014-04-25 ENCOUNTER — Ambulatory Visit (HOSPITAL_COMMUNITY): Payer: BC Managed Care – PPO | Admitting: Anesthesiology

## 2014-04-25 ENCOUNTER — Ambulatory Visit: Admit: 2014-04-25 | Payer: Self-pay | Admitting: General Surgery

## 2014-04-25 ENCOUNTER — Ambulatory Visit (HOSPITAL_COMMUNITY): Payer: BC Managed Care – PPO | Admitting: Vascular Surgery

## 2014-04-25 ENCOUNTER — Encounter (HOSPITAL_COMMUNITY): Payer: Self-pay | Admitting: Anesthesiology

## 2014-04-25 DIAGNOSIS — K219 Gastro-esophageal reflux disease without esophagitis: Secondary | ICD-10-CM | POA: Insufficient documentation

## 2014-04-25 DIAGNOSIS — Z86718 Personal history of other venous thrombosis and embolism: Secondary | ICD-10-CM | POA: Diagnosis not present

## 2014-04-25 DIAGNOSIS — I10 Essential (primary) hypertension: Secondary | ICD-10-CM | POA: Diagnosis not present

## 2014-04-25 DIAGNOSIS — E785 Hyperlipidemia, unspecified: Secondary | ICD-10-CM | POA: Insufficient documentation

## 2014-04-25 DIAGNOSIS — K429 Umbilical hernia without obstruction or gangrene: Secondary | ICD-10-CM | POA: Insufficient documentation

## 2014-04-25 DIAGNOSIS — I4891 Unspecified atrial fibrillation: Secondary | ICD-10-CM | POA: Diagnosis not present

## 2014-04-25 HISTORY — PX: LAPAROSCOPIC APPENDECTOMY: SHX408

## 2014-04-25 HISTORY — PX: UMBILICAL HERNIA REPAIR: SHX196

## 2014-04-25 LAB — PROTIME-INR
INR: 1.02 (ref 0.00–1.49)
Prothrombin Time: 13.5 seconds (ref 11.6–15.2)

## 2014-04-25 SURGERY — APPENDECTOMY, LAPAROSCOPIC
Anesthesia: General

## 2014-04-25 SURGERY — APPENDECTOMY, LAPAROSCOPIC
Anesthesia: General | Site: Abdomen

## 2014-04-25 MED ORDER — FENTANYL CITRATE 0.05 MG/ML IJ SOLN
INTRAMUSCULAR | Status: AC
  Filled 2014-04-25: qty 5

## 2014-04-25 MED ORDER — BUPIVACAINE-EPINEPHRINE 0.25% -1:200000 IJ SOLN
INTRAMUSCULAR | Status: DC | PRN
  Administered 2014-04-25: 30 mL

## 2014-04-25 MED ORDER — MEPERIDINE HCL 25 MG/ML IJ SOLN: 6.2500 mg | INTRAMUSCULAR | Status: DC | PRN

## 2014-04-25 MED ORDER — SCOPOLAMINE 1 MG/3DAYS TD PT72
1.0000 | MEDICATED_PATCH | TRANSDERMAL | Status: DC
  Administered 2014-04-25: 1.5 mg via TRANSDERMAL
  Filled 2014-04-25: qty 1

## 2014-04-25 MED ORDER — FENTANYL CITRATE 0.05 MG/ML IJ SOLN
INTRAMUSCULAR | Status: DC | PRN
  Administered 2014-04-25: 100 ug via INTRAVENOUS
  Administered 2014-04-25 (×3): 50 ug via INTRAVENOUS

## 2014-04-25 MED ORDER — FENTANYL CITRATE 0.05 MG/ML IJ SOLN
50.0000 ug | Freq: Once | INTRAMUSCULAR | Status: AC
  Administered 2014-04-25: 50 ug via INTRAVENOUS

## 2014-04-25 MED ORDER — ROCURONIUM BROMIDE 100 MG/10ML IV SOLN
INTRAVENOUS | Status: DC | PRN
  Administered 2014-04-25: 40 mg via INTRAVENOUS

## 2014-04-25 MED ORDER — PROPOFOL 10 MG/ML IV BOLUS
INTRAVENOUS | Status: AC
  Filled 2014-04-25: qty 20

## 2014-04-25 MED ORDER — PROPOFOL 10 MG/ML IV BOLUS
INTRAVENOUS | Status: DC | PRN
  Administered 2014-04-25: 160 mg via INTRAVENOUS

## 2014-04-25 MED ORDER — GLYCOPYRROLATE 0.2 MG/ML IJ SOLN
INTRAMUSCULAR | Status: DC | PRN
  Administered 2014-04-25: .8 mg via INTRAVENOUS

## 2014-04-25 MED ORDER — OXYCODONE HCL 5 MG PO TABS
5.0000 mg | ORAL_TABLET | ORAL | Status: DC | PRN
  Administered 2014-04-25: 5 mg via ORAL

## 2014-04-25 MED ORDER — DEXTROSE 5 % IV SOLN
INTRAVENOUS | Status: DC | PRN
  Administered 2014-04-25: 08:00:00 via INTRAVENOUS

## 2014-04-25 MED ORDER — LACTATED RINGERS IV SOLN
INTRAVENOUS | Status: DC | PRN
  Administered 2014-04-25 (×2): via INTRAVENOUS

## 2014-04-25 MED ORDER — GLYCOPYRROLATE 0.2 MG/ML IJ SOLN
INTRAMUSCULAR | Status: AC
  Filled 2014-04-25: qty 4

## 2014-04-25 MED ORDER — ONDANSETRON HCL 4 MG/2ML IJ SOLN
INTRAMUSCULAR | Status: AC
  Filled 2014-04-25: qty 2

## 2014-04-25 MED ORDER — SODIUM CHLORIDE 0.9 % IV SOLN: INTRAVENOUS | Status: DC

## 2014-04-25 MED ORDER — 0.9 % SODIUM CHLORIDE (POUR BTL) OPTIME
TOPICAL | Status: DC | PRN
  Administered 2014-04-25: 1000 mL

## 2014-04-25 MED ORDER — ARTIFICIAL TEARS OP OINT
TOPICAL_OINTMENT | OPHTHALMIC | Status: AC
  Filled 2014-04-25: qty 3.5

## 2014-04-25 MED ORDER — BUPIVACAINE-EPINEPHRINE (PF) 0.25% -1:200000 IJ SOLN
INTRAMUSCULAR | Status: AC
  Filled 2014-04-25: qty 30

## 2014-04-25 MED ORDER — FENTANYL CITRATE 0.05 MG/ML IJ SOLN
INTRAMUSCULAR | Status: AC
  Filled 2014-04-25: qty 2

## 2014-04-25 MED ORDER — OXYCODONE HCL 5 MG PO TABS
ORAL_TABLET | ORAL | Status: AC
  Filled 2014-04-25: qty 1

## 2014-04-25 MED ORDER — NEOSTIGMINE METHYLSULFATE 10 MG/10ML IV SOLN
INTRAVENOUS | Status: DC | PRN
Start: 2014-04-25 — End: 2014-04-25
  Administered 2014-04-25: 5 mg via INTRAVENOUS

## 2014-04-25 MED ORDER — ONDANSETRON HCL 4 MG/2ML IJ SOLN
INTRAMUSCULAR | Status: DC | PRN
  Administered 2014-04-25: 4 mg via INTRAVENOUS

## 2014-04-25 MED ORDER — PROMETHAZINE HCL 25 MG/ML IJ SOLN: 6.2500 mg | INTRAMUSCULAR | Status: DC | PRN

## 2014-04-25 MED ORDER — ROCURONIUM BROMIDE 50 MG/5ML IV SOLN
INTRAVENOUS | Status: AC
  Filled 2014-04-25: qty 1

## 2014-04-25 MED ORDER — FENTANYL CITRATE 0.05 MG/ML IJ SOLN
INTRAMUSCULAR | Status: DC
Start: 2014-04-25 — End: 2014-04-25
  Filled 2014-04-25: qty 2

## 2014-04-25 MED ORDER — LIDOCAINE HCL (CARDIAC) 20 MG/ML IV SOLN
INTRAVENOUS | Status: AC
  Filled 2014-04-25: qty 5

## 2014-04-25 MED ORDER — FENTANYL CITRATE 0.05 MG/ML IJ SOLN
25.0000 ug | INTRAMUSCULAR | Status: DC | PRN
  Administered 2014-04-25 (×3): 50 ug via INTRAVENOUS

## 2014-04-25 MED ORDER — NEOSTIGMINE METHYLSULFATE 10 MG/10ML IV SOLN
INTRAVENOUS | Status: AC
  Filled 2014-04-25: qty 1

## 2014-04-25 MED ORDER — MIDAZOLAM HCL 5 MG/5ML IJ SOLN
INTRAMUSCULAR | Status: DC | PRN
Start: 2014-04-25 — End: 2014-04-25
  Administered 2014-04-25 (×2): 1 mg via INTRAVENOUS

## 2014-04-25 MED ORDER — OXYCODONE-ACETAMINOPHEN 10-325 MG PO TABS: 1.0000 | ORAL_TABLET | Freq: Four times a day (QID) | ORAL | Status: AC | PRN

## 2014-04-25 MED ORDER — LIDOCAINE HCL (CARDIAC) 20 MG/ML IV SOLN
INTRAVENOUS | Status: DC | PRN
  Administered 2014-04-25: 100 mg via INTRAVENOUS

## 2014-04-25 MED ORDER — MIDAZOLAM HCL 2 MG/2ML IJ SOLN
INTRAMUSCULAR | Status: AC
Start: 2014-04-25 — End: 2014-04-25
  Filled 2014-04-25: qty 2

## 2014-04-25 SURGICAL SUPPLY — 64 items
APPLIER CLIP ROT 10 11.4 M/L (STAPLE) ×2
APR CLP MED LRG 11.4X10 (STAPLE) ×1
BAG SPEC RTRVL 10 TROC 200 (ENDOMECHANICALS) ×1
BLADE SURG ROTATE 9660 (MISCELLANEOUS) ×1 IMPLANT
CANISTER SUCTION 2500CC (MISCELLANEOUS) ×2 IMPLANT
CHLORAPREP W/TINT 26ML (MISCELLANEOUS) ×2 IMPLANT
CLIP APPLIE ROT 10 11.4 M/L (STAPLE) IMPLANT
COVER SURGICAL LIGHT HANDLE (MISCELLANEOUS) ×2 IMPLANT
CUTTER FLEX LINEAR 45M (STAPLE) ×2 IMPLANT
DECANTER SPIKE VIAL GLASS SM (MISCELLANEOUS) ×2 IMPLANT
DRAPE LAPAROSCOPIC ABDOMINAL (DRAPES) ×2 IMPLANT
DRAPE PED LAPAROTOMY (DRAPES) ×2 IMPLANT
ELECT CAUTERY BLADE 6.4 (BLADE) ×2 IMPLANT
ELECT REM PT RETURN 9FT ADLT (ELECTROSURGICAL) ×2
ELECTRODE REM PT RTRN 9FT ADLT (ELECTROSURGICAL) ×1 IMPLANT
GAUZE SPONGE 4X4 16PLY XRAY LF (GAUZE/BANDAGES/DRESSINGS) ×2 IMPLANT
GLOVE BIO SURGEON STRL SZ7 (GLOVE) ×2 IMPLANT
GLOVE BIOGEL PI IND STRL 6.5 (GLOVE) IMPLANT
GLOVE BIOGEL PI IND STRL 7.0 (GLOVE) IMPLANT
GLOVE BIOGEL PI IND STRL 7.5 (GLOVE) ×1 IMPLANT
GLOVE BIOGEL PI INDICATOR 6.5 (GLOVE) ×1
GLOVE BIOGEL PI INDICATOR 7.0 (GLOVE) ×1
GLOVE BIOGEL PI INDICATOR 7.5 (GLOVE) ×1
GLOVE ECLIPSE 6.5 STRL STRAW (GLOVE) ×1 IMPLANT
GLOVE SURG SS PI 7.0 STRL IVOR (GLOVE) ×1 IMPLANT
GOWN STRL REUS W/ TWL LRG LVL3 (GOWN DISPOSABLE) ×3 IMPLANT
GOWN STRL REUS W/TWL LRG LVL3 (GOWN DISPOSABLE) ×6
KIT BASIN OR (CUSTOM PROCEDURE TRAY) ×2 IMPLANT
KIT ROOM TURNOVER OR (KITS) ×2 IMPLANT
LIQUID BAND (GAUZE/BANDAGES/DRESSINGS) ×2 IMPLANT
NDL HYPO 25GX1X1/2 BEV (NEEDLE) ×1 IMPLANT
NEEDLE HYPO 25GX1X1/2 BEV (NEEDLE) ×2 IMPLANT
NS IRRIG 1000ML POUR BTL (IV SOLUTION) ×2 IMPLANT
PACK SURGICAL SETUP 50X90 (CUSTOM PROCEDURE TRAY) ×2 IMPLANT
PAD ARMBOARD 7.5X6 YLW CONV (MISCELLANEOUS) ×4 IMPLANT
PENCIL BUTTON HOLSTER BLD 10FT (ELECTRODE) ×2 IMPLANT
POUCH RETRIEVAL ECOSAC 10 (ENDOMECHANICALS) ×1 IMPLANT
POUCH RETRIEVAL ECOSAC 10MM (ENDOMECHANICALS) ×1
RELOAD 45 VASCULAR/THIN (ENDOMECHANICALS) ×2 IMPLANT
RELOAD STAPLE 45 2.5 WHT GRN (ENDOMECHANICALS) ×1 IMPLANT
RELOAD STAPLE 45 3.5 BLU ETS (ENDOMECHANICALS) IMPLANT
RELOAD STAPLE TA45 3.5 REG BLU (ENDOMECHANICALS) ×2 IMPLANT
SCALPEL HARMONIC ACE (MISCELLANEOUS) ×2 IMPLANT
SCISSORS LAP 5X35 DISP (ENDOMECHANICALS) ×1 IMPLANT
SET IRRIG TUBING LAPAROSCOPIC (IRRIGATION / IRRIGATOR) ×2 IMPLANT
SLEEVE ENDOPATH XCEL 5M (ENDOMECHANICALS) ×2 IMPLANT
SPECIMEN JAR SMALL (MISCELLANEOUS) ×2 IMPLANT
STRIP CLOSURE SKIN 1/2X4 (GAUZE/BANDAGES/DRESSINGS) ×1 IMPLANT
SUT ETHIBOND 0 MO6 C/R (SUTURE) ×1 IMPLANT
SUT MNCRL AB 4-0 PS2 18 (SUTURE) ×2 IMPLANT
SUT VIC AB 2-0 CT1 27 (SUTURE) ×2
SUT VIC AB 2-0 CT1 TAPERPNT 27 (SUTURE) ×1 IMPLANT
SUT VIC AB 3-0 SH 27 (SUTURE) ×2
SUT VIC AB 3-0 SH 27X BRD (SUTURE) ×1 IMPLANT
SUT VICRYL AB 2 0 TIES (SUTURE) ×2 IMPLANT
SYR CONTROL 10ML LL (SYRINGE) ×2 IMPLANT
TOWEL OR 17X24 6PK STRL BLUE (TOWEL DISPOSABLE) ×2 IMPLANT
TOWEL OR 17X26 10 PK STRL BLUE (TOWEL DISPOSABLE) ×2 IMPLANT
TRAY FOLEY CATH 16FR SILVER (SET/KITS/TRAYS/PACK) ×2 IMPLANT
TRAY LAPAROSCOPIC (CUSTOM PROCEDURE TRAY) ×2 IMPLANT
TROCAR XCEL BLUNT TIP 100MML (ENDOMECHANICALS) ×2 IMPLANT
TROCAR XCEL NON-BLD 5MMX100MML (ENDOMECHANICALS) ×2 IMPLANT
TUBE CONNECTING 12X1/4 (SUCTIONS) IMPLANT
TUBING INSUFFLATION (TUBING) ×2 IMPLANT

## 2014-04-25 NOTE — Progress Notes (Signed)
Spoke with Dr. Linna Caprice re: INR result. Order rec'd to draw PT/INR now.

## 2014-04-25 NOTE — Discharge Instructions (Signed)
[facility] LAPAROSCOPIC SURGERY: POST OP INSTRUCTIONS  Always review your discharge instruction sheet given to you by the facility where your surgery was performed. IF YOU HAVE DISABILITY OR FAMILY LEAVE FORMS, YOU MUST BRING THEM TO THE OFFICE FOR PROCESSING.   DO NOT GIVE THEM TO YOUR DOCTOR.  1. A prescription for pain medication may be given to you upon discharge.  Take your pain medication as prescribed, if needed.  If narcotic pain medicine is not needed, then you may take acetaminophen (Tylenol), naprosyn (Alleve), or ibuprofen (Advil) as needed. 2. Take your usually prescribed medications unless otherwise directed. 3. If you need a refill on your pain medication, please contact your pharmacy.  They will contact our office to request authorization. Prescriptions will not be filled after 5pm or on week-ends. 4. You should follow a light diet the first few days after arrival home, such as soup and crackers, etc.  Be sure to include lots of fluids daily. 5. Most patients will experience some swelling and bruising in the area of the incisions.  Ice packs will help.  Swelling and bruising can take several days to resolve.  6. It is common to experience some constipation if taking pain medication after surgery.  Increasing fluid intake and taking a stool softener (such as Colace) will usually help or prevent this problem from occurring.  A mild laxative (Milk of Magnesia or Miralax) should be taken according to package instructions if there are no bowel movements after 48 hours. 7. Unless discharge instructions indicate otherwise, you may remove your bandages 48 hours after surgery, and you may shower at that time.  You may have steri-strips (small skin tapes) in place directly over the incision.  These strips should be left on the skin for 7-10 days.  If your surgeon used skin glue on the incision, you may shower in 24 hours.  The glue will flake  off over the next 2-3 weeks.  Any sutures or staples will be removed at the office during your follow-up visit. 8. ACTIVITIES:  You may resume regular (light) daily activities beginning the next day--such as daily self-care, walking, climbing stairs--gradually increasing activities as tolerated.  You may have sexual intercourse when it is comfortable.  Refrain from any heavy lifting or straining until approved by your doctor. a. You may drive when you are no longer taking prescription pain medication, you can comfortably wear a seatbelt, and you can safely maneuver your car and apply brakes. b. RETURN TO WORK:  __________________________________________________________ 9. You should see your doctor in the office for a follow-up appointment approximately 2-3 weeks after your surgery.  Make sure that you call for this appointment within a day or two after you arrive home to insure a convenient appointment time. 10. OTHER INSTRUCTIONS: __________________________________________________________________________________________________________________________ __________________________________________________________________________________________________________________________ WHEN TO CALL YOUR DOCTOR: 1. Fever over 101.0 2. Inability to urinate 3. Continued bleeding from incision. 4. Increased pain, redness, or drainage from the incision. 5. Increasing abdominal pain  The clinic staff is available to answer your questions during regular business hours.  Please dont hesitate to call and ask to speak to one of the nurses for clinical concerns.  If you have a medical emergency, go to the nearest emergency room or call 911.  A surgeon from [facility] is always on call at the hospital. [address], [place], [place], [place]  [address] ? [address], [place], [place]   [address] [phone] ? [phone] ? FAX [phone]  [phone] Web site: [email or web address] What to  eat:  For your first meals, you should eat lightly; only small meals initially.  If you do not have nausea, you may eat larger meals.  Avoid spicy, greasy and heavy food.    General Anesthesia, Adult, Care After  Refer to this sheet in the next few weeks. These instructions provide you with information on caring for yourself after your procedure. Your health care provider may also give you more specific instructions. Your treatment has been planned according to current medical practices, but problems sometimes occur. Call your health care provider if you have any problems or questions after your procedure.  WHAT TO EXPECT AFTER THE PROCEDURE  After the procedure, it is typical to experience:  Sleepiness.  Nausea and vomiting. HOME CARE INSTRUCTIONS  For the first 24 hours after general anesthesia:  Have a responsible person with you.  Do not drive a car. If you are alone, do not take public transportation.  Do not drink alcohol.  Do not take medicine that has not been prescribed by your health care provider.  Do not sign important papers or make important decisions.  You may resume a normal diet and activities as directed by your health care provider.  Change bandages (dressings) as directed.  If you have questions or problems that seem related to general anesthesia, call the hospital and ask for the anesthetist or anesthesiologist on call. SEEK MEDICAL CARE IF:  You have nausea and vomiting that continue the day after anesthesia.  You develop a rash. SEEK IMMEDIATE MEDICAL CARE IF:  You have difficulty breathing.  You have chest pain.  You have any allergic problems. Document Released: 07/21/2000 Document Revised: 12/15/2012 Document Reviewed: 10/28/2012  [facility] Patient Information 2014 [place], [place].

## 2014-04-25 NOTE — Addendum Note (Signed)
Addendum  created 04/25/14 1537 by Terrill Mohr, CRNA   Modules edited: Anesthesia LDA, Lines/Drains/Airways Properties Editor   Lines/Drains/Airways Properties Editor:  Properties of line/drain/airway/wound [REMOVED] Peripheral IV 04/25/14 Left Wrist have been modified.

## 2014-04-25 NOTE — Transfer of Care (Signed)
Immediate Anesthesia Transfer of Care Note  Patient: [name]  Procedure(s) Performed: Procedure(s): APPENDECTOMY LAPAROSCOPIC (N/A) HERNIA REPAIR UMBILICAL ADULT (PRIMARY) (N/A)  Patient Location: PACU  Anesthesia Type:General  Level of Consciousness: awake, sedated and patient cooperative  Airway & Oxygen Therapy: Patient Spontanous Breathing and Patient connected to nasal cannula oxygen  Post-op Assessment: Report given to PACU RN, Post -op Vital signs reviewed and stable and Patient moving all extremities  Post vital signs: Reviewed and stable  Complications: No apparent anesthesia complications

## 2014-04-25 NOTE — Anesthesia Procedure Notes (Signed)
Procedure Name: Intubation Date/Time: 04/25/2014 7:38 AM Performed by: Terrill Mohr Pre-anesthesia Checklist: Patient identified, Emergency Drugs available, Suction available and Patient being monitored Patient Re-evaluated:Patient Re-evaluated prior to inductionOxygen Delivery Method: Circle system utilized Preoxygenation: Pre-oxygenation with 100% oxygen Intubation Type: IV induction Ventilation: Mask ventilation without difficulty Laryngoscope Size: Mac and 3 Grade View: Grade I Tube type: Oral Tube size: 7.5 mm Number of attempts: 1 Airway Equipment and Method: Stylet Placement Confirmation: ETT inserted through vocal cords under direct vision,  breath sounds checked- equal and bilateral and positive ETCO2 Secured at: 21 (cm at teeth) cm Tube secured with: Tape Dental Injury: Teeth and Oropharynx as per pre-operative assessment

## 2014-04-25 NOTE — H&P (Signed)
85 yom who I know from previously who has history of DVT and factor V Leiden mutation. He has longstanding umbilical hernia that is becoming more symptomatic and causing his discomfort. It does not reduce as easily now either. He has also undergone recent colonoscopy with Dr Paulita Fujita that had several polyps. The polyp that is listed at the AO is a sessile serrated polyp with no high grade dysplasia or malignancy. He also complains of some ruq pain that has no real relation to eating but has been occurring for some time and is getting worse. He does not have n/v. He also has significant low back pain. His evaluation of RUQ is negative with nl [place] and a hida scan. I have discussed with Dr Paulita Fujita his appendiceal polyp and he has assured me if appendectomy with small cuff of cecum is removed that should remove the polyp. I also have recs from Dr Laurann Montana to stop his coumadin 5 days prior and then start warfarin night of surgery and bridge with lovenox at that point.   Other Problems Atrial Fibrillation Back Pain Chronic Renal Failure Syndrome Diverticulosis Gastroesophageal Reflux Disease Hemorrhoids High blood pressure Hypercholesterolemia Pulmonary Embolism / Blood Clot in Legs Sleep Apnea Umbilical Hernia Repair Vascular Disease  Past Surgical History  Colon Polyp Removal - Colonoscopy Colon Polyp Removal - Open Foot Surgery Left. Hemorrhoidectomy Knee Surgery Right. Shoulder Surgery Right. Spinal Surgery - Neck Vasectomy  Allergies  Sulfa Antibiotics  Social History  Alcohol use Occasional alcohol use. Caffeine use Carbonated beverages, Tea. No drug use Tobacco use Never smoker.   Review of Systems  General Present- Weight Loss. Not Present- Appetite Loss, Chills, Fatigue, Fever, Night Sweats and Weight Gain. HEENT Present- Sore Throat. Not Present- Earache, Hearing Loss, Hoarseness, Nose Bleed, Oral Ulcers, Ringing in the Ears, Seasonal Allergies,  Sinus Pain, Visual Disturbances, Wears glasses/contact lenses and Yellow Eyes. Respiratory Not Present- Bloody sputum, Chronic Cough, Difficulty Breathing, Snoring and Wheezing. Breast Not Present- Breast Mass, Breast Pain, Nipple Discharge and Skin Changes. Cardiovascular Present- Leg Cramps, Rapid Heart Rate and Swelling of Extremities. Not Present- Chest Pain, Difficulty Breathing Lying Down, Palpitations and Shortness of Breath. Gastrointestinal Present- Abdominal Pain. Not Present- Bloating, Bloody Stool, Change in Bowel Habits, Chronic diarrhea, Constipation, Difficulty Swallowing, Excessive gas, Gets full quickly at meals, Hemorrhoids, Indigestion, Nausea, Rectal Pain and Vomiting. Male Genitourinary Not Present- Blood in Urine, Change in Urinary Stream, Frequency, Impotence, Nocturia, Painful Urination, Urgency and Urine Leakage. Musculoskeletal Present- Back Pain, Joint Stiffness and Swelling of Extremities. Not Present- Joint Pain, Muscle Pain and Muscle Weakness. Neurological Not Present- Decreased Memory, Fainting, Headaches, Numbness, Seizures, Tingling, Tremor, Trouble walking and Weakness. Psychiatric Not Present- Anxiety, Bipolar, Change in Sleep Pattern, Depression, Fearful and Frequent crying. Endocrine Not Present- Cold Intolerance, Excessive Hunger, Hair Changes, Heat Intolerance, Hot flashes and New Diabetes. Hematology Present- Easy Bruising and Excessive bleeding. Not Present- Gland problems, HIV and Persistent Infections. Allergies  Sulfa Antibiotics  Medication History  Atorvastatin Calcium (10MG  Tablet, Oral) Active. Cardizem CD (180MG  Capsule ER 24HR, Oral) Active. Omeprazole (40MG  Capsule DR, Oral) Active. Fortesta (10 MG/ACT(2%) Gel, Transdermal) Active. Warfarin Sodium (2.5MG  Tablet, Oral) Active. AndroGel Pump (12.5 MG/ACT(1%) Gel, Transdermal) Active.  Vitals  04/13/2014 10:30 AM Weight: 182 lb Height: 69in Body Surface Area: 2 m Body Mass  Index: 26.88 kg/m Pulse: 76 (Regular)  BP: 130/74 (Sitting, Left Arm, Standard)    Physical Exam  General Mental Status-Alert. Orientation-Oriented X3.  Chest and Lung Exam Chest and lung exam  reveals -on auscultation, normal breath sounds, no adventitious sounds and normal vocal resonance.  Cardiovascular Cardiovascular examination reveals -normal heart sounds, regular rate and rhythm with no murmurs.  Abdomen Note: reducible tender U[facility] soft/nd   Assessment & Plan  UMBILICAL HERNIA (333.8  K42.9) Impression: recommended open repair with suture ( I think small ) due to comcomitant appendectomy. discussed recurrence risk as well as restrictions postoperatively. COLON POLYP (211.3  K63.5) Impression: Plan for laparoscopic appendectomy with small cuff of cecum to ensure polyp removal after discussion with Dr Paulita Fujita. We discussed there is small possibility of needing to return to or for colectomy based on pathology.

## 2014-04-25 NOTE — Interval H&P Note (Signed)
History and Physical Interval Note:  04/25/2014 7:32 AM  Steven Conley  has presented today for surgery, with the diagnosis of APPENDICAL POLYP, UMBILICAL HERNIA  The various methods of treatment have been discussed with the patient and family. After consideration of risks, benefits and other options for treatment, the patient has consented to  Procedure(s): APPENDECTOMY LAPAROSCOPIC (N/A) [name] (N/A) as a surgical intervention .  The patient's history has been reviewed, patient examined, no change in status, stable for surgery.  I have reviewed the patient's chart and labs.  Questions were answered to the patient's satisfaction.     [name]

## 2014-04-25 NOTE — Op Note (Signed)
Preoperative diagnosis: #1 umbilical hernia #2 appendiceal polyp Postoperative diagnosis: Same as above Procedure: #1 laparoscopic appendectomy with small cuff of the cecum #2 primary umbilical hernia repair was suture Surgeon: Dr. Serita Grammes Anesthesia: Gen. Estimated blood loss: Minimal Specimens: Appendix and small portion of cecum to pathology Consultations: None Drains: None Sponge and needle count was correct at completion Discussion to recovery in stable condition  Indications: This a 53 year old male who has a symptomatic umbilical hernia. He also was recently undergone his colonoscopy which showed a polyp that was resected as well as one that was at his appendiceal orifice. This was biopsied and is a serrated adenoma. I discussed this with Dr. Paulita Fujita. The polyp is at the appendiceal orifice and cannot be resected due to the fact that extends into the appendix. He told me that if I remove the appendix and a small portion of the cecum and the remainder of this polyp should be removed for pathologic review. I discussed this plan with the patient.  Procedure: After informed consent was obtained the patient was taken to the operating room. He had been given antibiotics and had sequential compression devices on his legs. He was placed under general anesthesia without complication. Foley catheter was placed. He was then prepped and draped in the standard sterile surgical fashion. A surgical timeout was then performed.  I infiltrated Marcaine below his umbilicus. I then made a curvilinear incision below the umbilicus. I encircled the umbilical stalk and divided it. He had about a 1 cm umbilical hernia. I then placed a 0 Vicryl pursestring through his hernia. I entered into his peritoneum. I placed a Hassan trocar and insufflated the abdomen to 15 mmHg pressure. Once I identified a site inserted 2 further 5 mm trocars in the suprapubic region as well as a left lower quadrant under direct  vision after infiltration with Marcaine. His appendix was then identified. I used the Harmonic scalpel to divide the appendiceal mesentery back to the cecum. I identified the terminal ileum. I then used the GIA stapler to remove the appendix as well as the cecum all the way up to the entry point of the terminal ileum into the cecum. This removed a small portion of the cecum and should remove the entire polyp according to his colonoscopy. I then placed this in a bag and removed from the umbilicus. This was hemostatic upon completion. I then removed my trocars and desufflated the abdomen. I then closed his umbilical hernia with Ethibond suture primarily. This completely obliterated the defect. I sewed his umbilicus back down with 3-0 Vicryl suture. I then closed with 3-0 Vicryl and 4-0 Monocryl. The incisions were all glued. Dressings were placed. He tolerated this well was extubated and transferred to the recovery room in stable condition.

## 2014-04-25 NOTE — Anesthesia Postprocedure Evaluation (Signed)
  Anesthesia Post-op Note  Patient: Steven Conley  Procedure(s) Performed: Procedure(s): APPENDECTOMY LAPAROSCOPIC (N/A) HERNIA REPAIR UMBILICAL ADULT (PRIMARY) (N/A)  Patient Location: PACU  Anesthesia Type:General  Level of Consciousness: awake and alert   Airway and Oxygen Therapy: Patient Spontanous Breathing and Patient connected to nasal cannula oxygen  Post-op Pain: mild  Post-op Assessment: Post-op Vital signs reviewed, Patient's Cardiovascular Status Stable, Respiratory Function Stable and Patent Airway  Post-op Vital Signs: Reviewed and stable  Last Vitals:  Filed Vitals:   04/25/14 0928  BP: 139/79  Pulse: 65  Temp:   Resp: 13    Complications: No apparent anesthesia complications

## 2014-04-26 ENCOUNTER — Encounter (HOSPITAL_COMMUNITY): Payer: Self-pay | Admitting: General Surgery

## 2014-05-05 ENCOUNTER — Other Ambulatory Visit: Payer: Self-pay | Admitting: Cardiology

## 2014-05-16 ENCOUNTER — Other Ambulatory Visit (INDEPENDENT_AMBULATORY_CARE_PROVIDER_SITE_OTHER): Payer: Self-pay | Admitting: General Surgery

## 2014-05-16 DIAGNOSIS — C7A00 Malignant carcinoid tumor of unspecified site: Secondary | ICD-10-CM

## 2014-05-18 ENCOUNTER — Telehealth: Payer: Self-pay | Admitting: Oncology

## 2014-05-18 NOTE — Telephone Encounter (Signed)
DELIVERED CHART 05/18/14

## 2014-05-18 NOTE — Telephone Encounter (Signed)
LEFT PT VM IN REF TO NP APPT. ON 06/01/14@1 :30

## 2014-06-01 ENCOUNTER — Ambulatory Visit: Payer: BLUE CROSS/BLUE SHIELD

## 2014-06-01 ENCOUNTER — Ambulatory Visit (HOSPITAL_BASED_OUTPATIENT_CLINIC_OR_DEPARTMENT_OTHER): Payer: BLUE CROSS/BLUE SHIELD | Admitting: Oncology

## 2014-06-01 ENCOUNTER — Encounter: Payer: Self-pay | Admitting: Oncology

## 2014-06-01 VITALS — BP 155/91 | HR 72 | Temp 98.1°F | Resp 18 | Ht 69.0 in | Wt 183.1 lb

## 2014-06-01 DIAGNOSIS — D3A020 Benign carcinoid tumor of the appendix: Secondary | ICD-10-CM

## 2014-06-01 DIAGNOSIS — D6851 Activated protein C resistance: Secondary | ICD-10-CM

## 2014-06-01 DIAGNOSIS — R109 Unspecified abdominal pain: Secondary | ICD-10-CM

## 2014-06-01 DIAGNOSIS — I87001 Postthrombotic syndrome without complications of right lower extremity: Secondary | ICD-10-CM

## 2014-06-01 DIAGNOSIS — C7A020 Malignant carcinoid tumor of the appendix: Secondary | ICD-10-CM

## 2014-06-01 NOTE — Progress Notes (Signed)
[name] Consult   Referring [id number] Steven Conley 54 y.o.  August 18, 1960    Reason for Referral: carcinoid tumor   HPI: Steven Conley underwent a colonoscopy by Dr. Paulita Fujita. He was noted to have several polyps (series we do not have the colonoscopy report or pathology report available today). This included an appendiceal polyp. He was referred to Dr. Donne Hazel for an appendectomy. He also had an umbilical hernia. He was taken to the operating room 04/25/2014 and underwent a laparoscopic appendectomy with a small cuff of the cecum and an umbilical hernia repair.  The pathology [id number]) revealed a 0.1 cm carcinoid tumor at the appendiceal tip. The tumor was well-differentiated confined to the appendix. The proximal margin was greater than 2 cm. Lymphovascular and perineural invasion were not identified. A 0.5 cm sessile mucosal polypoid was noted at the appendiceal os.  He reports chronic irritable bowel syndrome with intermittent diarrhea for the past 25 years. No consistent diarrhea or flushing. He has a history of intermittent right upper abdominal pain of unclear etiology. This was evaluated in 2015 with an abdominal ultrasound that revealed fatty change of the liver and no acute gallbladder pathology. A hepatobiliary clear scan was normal. The right upper quadrant pain has not occurred since surgery.    Past Medical History  Diagnosis Date  . Heterozygote Factor V Leiden mutation     Dr. Lenna Sciara. [name]  . Hyperlipidemia   . Hemorrhoid   . Colon polyp   . DVT (deep venous thrombosis)-recurrent, maintained on anticoagulation   . PONV (postoperative nausea and vomiting)     no problems with Propofol use  . Hypertension     not currently on BP medication  . Chronic kidney disease May 2014    acute renal failure no dialysis due to dehydration  . GERD (gastroesophageal reflux disease)   .    Marland Kitchen Low grade carcinoid tumor  involving the appendix tip 04/25/2014      . Dysrhythmia   . Sleep apnea 2015    uses CPAP intermittently  . Cancer     squamous cell-left nose    Past Surgical History  Procedure Laterality Date  . Nose surgery  1972, 1985  . Knee arthroscopy      right  . Shoulder surgery      right  . Biceps tendon repair      right  . Vasectomy    . Hemorroidectomy  1995  . Esophagogastroduodenoscopy (egd) with propofol  03/10/2012    Procedure: ESOPHAGOGASTRODUODENOSCOPY (EGD) WITH PROPOFOL;  Surgeon: Arta Silence, MD;  Location: [facility];  Service: Endoscopy;  Laterality: N/A;  . Bravo ph study  03/10/2012    Procedure: BRAVO [place] STUDY;  Surgeon: Arta Silence, MD;  Location: [facility];  Service: Endoscopy;  Laterality: N/A;  . Eye surgery Bilateral     lasik  . Anterior cervical decomp/discectomy fusion N/A 11/16/2012    Procedure: ANTERIOR CERVICAL DECOMPRESSION/DISCECTOMY FUSION 2 LEVELS;  Surgeon: Charlie Pitter, MD;  Location: [facility];  Service: Neurosurgery;  Laterality: N/A;  Cervical four-five, cervical five-six anterior cervical decompression fusion with PEEK and plate   . Neck surgery    . Fracture surgery      left ankle- bursectomy  . Laparoscopic appendectomy N/A 04/25/2014    Procedure: APPENDECTOMY LAPAROSCOPIC;  Surgeon: Rolm Bookbinder, MD;  Location: [facility];  Service: General;  Laterality: N/A;  . Umbilical hernia repair N/A 04/25/2014    Procedure:  HERNIA REPAIR UMBILICAL ADULT (PRIMARY);  Surgeon: Rolm Bookbinder, MD;  Location: [facility];  Service: General;  Laterality: N/A;    Medications: Reviewed  Allergies:  Allergies  Allergen Reactions  . Sulfa Drugs Cross Reactors Hives    Family history: his brother died of gastric cancer. His mother and sister had breast cancer.  Social History:   He lives in [place]. He is a Museum/gallery curator. He does not use tobacco. He reports social alcohol use. No risk factor for HIV or hepatitis.    ROS:   Positives  include:irregular bowel habits, chronic reflux symptoms relieved with omeprazole, intermittent right abdominal pain and low back pain for several years-last episode of back pain in the summer of 2015, intermittent rectal bleeding, rash over his trunk, chronic swelling of the right lower leg after having a deep vein thrombosis  A complete ROS was otherwise negative.  Physical Exam:  Blood pressure 155/91, pulse 72, temperature 98.1 F (36.7 C), temperature source Oral, resp. rate 18, height 5\' 9"  (1.753 m), weight 183 lb 1.6 oz (83.054 kg), SpO2 98 %.  HEENT: oropharynx without visible mass, neck without mass Lungs: clear bilaterally Cardiac: regular rate and rhythm Abdomen: no hepatomegaly, nontender, no mass, healed surgical incision GU: testes without mass  Vascular: varicosities and trace edema at the right lower leg and foot Lymph nodes: no cervical, supra-clavicular, axillary, or inguinal nodes Neurologic: alert and oriented, the motor exam appears intact in the upper and lower extremities Skin: macular erythematous rash over the trunk Musculoskeletal: no spine tenderness   LAB:  CBC  Lab Results  Component Value Date   WBC 7.7 04/18/2014   HGB 16.6 04/18/2014   HCT 48.1 04/18/2014   MCV 89.4 04/18/2014   PLT 162 04/18/2014   NEUTROABS 5.4 04/18/2014        Assessment/Plan:   1. 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 Umbilical hernia repair 48/18/5631  3.   Factor V Leiden heterozygote  4.   History of an intermittently positive anti-Cardiolipin  and anti-beta 2 glycoprotein antibody  5.   History of recurrent venous thrombosis-maintained on Coumadin anticoagulation  6.   History of gastroesophageal reflux disease  7.   History of colon polyps  8.   Intermittent right abdominal pain-etiology unclear  9.   History of atrial fibrillation  10. Right leg postphlebitic syndrome     Disposition:   Mr.  Conley is referred for evaluation of an incidentally discovered carcinoid tumor. There is no clinical evidence for carcinoid syndrome. He has an excellent prognosis for a long-term disease-free survival with regard to the low-grade carcinoid tumor. I do not recommend radiologic staging or laboratory evaluation for the carcinoid tumor.  He plans to continue upper endoscopy and colonoscopy follow-up with Dr. Paulita Fujita. He has a personal history of gastroesophageal reflux disease and his brother died of gastric cancer. He has a personal history of colon polyps. I will follow-up on the recent endoscopy notes and pathology reports from Dr. Paulita Fujita.  He continues indefinite Coumadin anticoagulation in the setting of recurrent venous thrombosis with a factor V Leiden mutation. He will consider discussing a switch to one of the newer anticoagulants with Dr. Laurann Montana.  Mr. Wiemers is not scheduled for a follow-up appointment at the [facility]. I will be glad to see him in the future as needed.  Approximately 50 minutes were spent with patient today. The majority of the time was used for counseling and coordination  of care.  [place], [place] 06/01/2014, 2:23 PM

## 2014-11-21 ENCOUNTER — Other Ambulatory Visit: Payer: Self-pay | Admitting: Cardiology

## 2014-11-22 ENCOUNTER — Other Ambulatory Visit: Payer: Self-pay

## 2014-11-22 MED ORDER — DILTIAZEM HCL ER COATED BEADS 180 MG PO CP24: ORAL_CAPSULE | ORAL | Status: DC

## 2015-01-16 ENCOUNTER — Other Ambulatory Visit: Payer: Self-pay | Admitting: Orthopedic Surgery

## 2015-01-16 DIAGNOSIS — M25561 Pain in right knee: Secondary | ICD-10-CM

## 2015-01-29 ENCOUNTER — Ambulatory Visit
Admission: RE | Admit: 2015-01-29 | Discharge: 2015-01-29 | Disposition: A | Discharge status: Home / Self Care | Payer: BLUE CROSS/BLUE SHIELD | Source: Ambulatory Visit | Attending: Orthopedic Surgery | Admitting: Orthopedic Surgery

## 2015-01-29 DIAGNOSIS — M25561 Pain in right knee: Secondary | ICD-10-CM

## 2015-12-10 ENCOUNTER — Other Ambulatory Visit: Payer: Self-pay | Admitting: Cardiology

## 2016-01-07 ENCOUNTER — Other Ambulatory Visit: Payer: Self-pay | Admitting: Cardiology

## 2016-01-11 ENCOUNTER — Encounter: Payer: Self-pay | Admitting: Cardiology

## 2016-01-24 ENCOUNTER — Encounter: Payer: Self-pay | Admitting: Cardiology

## 2016-01-24 ENCOUNTER — Ambulatory Visit (INDEPENDENT_AMBULATORY_CARE_PROVIDER_SITE_OTHER): Payer: 59 | Admitting: Cardiology

## 2016-01-24 VITALS — BP 148/88 | HR 68 | Ht 69.0 in | Wt 186.1 lb

## 2016-01-24 DIAGNOSIS — Z86718 Personal history of other venous thrombosis and embolism: Secondary | ICD-10-CM

## 2016-01-24 DIAGNOSIS — I480 Paroxysmal atrial fibrillation: Secondary | ICD-10-CM

## 2016-01-24 DIAGNOSIS — I10 Essential (primary) hypertension: Secondary | ICD-10-CM

## 2016-01-24 MED ORDER — DILTIAZEM HCL ER COATED BEADS 240 MG PO CP24: 240.0000 mg | ORAL_CAPSULE | Freq: Every day | ORAL | 3 refills | Status: DC

## 2016-01-24 NOTE — Progress Notes (Signed)
Cardiology Office Note    Date:  01/24/2016   ID:  [name], DOB 08/09/1960, MRN [id number]  PCP:  Steven Shelling, MD  Cardiologist:  Steven Him, MD   Chief Complaint  Patient presents with  . Atrial Fibrillation  . Hypertension    History of Present Illness:  Steven Conley is a 55 y.o. male with a history of HTN, dyslipidemia, DVT (with Factor V Leiden) and PAF who presents today for followup.  He denies any chest pain, SOB, DOE, LE edema  or syncope.  He still has some palpitations that are intermittent and can last as long as 1 hour and feels dizzy when he gets it. Since then it has only lasted 15 seconds when he gets it.     Past Medical History:  Diagnosis Date  . Blood dyscrasia   . Cancer (HCC)    squamous cell  . Chronic kidney disease    acute renal failure no dialysis due to dehydration  . Colon polyp   . DVT (deep venous thrombosis) ([place])   . Dysrhythmia   . Ejection fraction    .  Steven Conley GERD (gastroesophageal reflux disease)   . Hemorrhoid   . Homozygous Factor V Leiden mutation ([place])    Dr. Lenna Conley. Steven Conley  . Hyperlipidemia   . Hypertension    not currently on BP medication  . PONV (postoperative nausea and vomiting)    no problems with Propofol use  . Sleep apnea 2015   uses CPAP intermittently    Past Surgical History:  Procedure Laterality Date  . ANTERIOR CERVICAL DECOMP/DISCECTOMY FUSION N/A 11/16/2012   Procedure: ANTERIOR CERVICAL DECOMPRESSION/DISCECTOMY FUSION 2 LEVELS;  Surgeon: Steven Pitter, MD;  Location: [facility];  Service: Neurosurgery;  Laterality: N/A;  Cervical four-five, cervical five-six anterior cervical decompression fusion with PEEK and plate   . BICEPS TENDON REPAIR     right  . BRAVO [place] STUDY  03/10/2012   Procedure: BRAVO [place];  Surgeon: Steven Silence, MD;  [facility]: [facility];  Service: Endoscopy;  Laterality: N/A;  . ESOPHAGOGASTRODUODENOSCOPY (EGD) WITH PROPOFOL  03/10/2012   Procedure: ESOPHAGOGASTRODUODENOSCOPY (EGD) WITH PROPOFOL;  Surgeon: Steven Silence, MD;  [facility]: [facility];  Service: Endoscopy;  Laterality: N/A;  . EYE SURGERY Bilateral    lasik  . FRACTURE SURGERY     left ankle- bursectomy  . HEMORROIDECTOMY  1995  . KNEE ARTHROSCOPY     right  . LAPAROSCOPIC APPENDECTOMY N/A 04/25/2014   Procedure: APPENDECTOMY LAPAROSCOPIC;  Surgeon: Steven Bookbinder, MD;  Location: [facility];  Service: General;  Laterality: N/A;  . NECK SURGERY    . NOSE SURGERY  1972, 1985  . SHOULDER SURGERY     right  . UMBILICAL HERNIA REPAIR N/A 04/25/2014   Procedure: HERNIA REPAIR UMBILICAL ADULT (PRIMARY);  Surgeon: Steven Bookbinder, MD;  Location: [facility] OR;  Service: General;  Laterality: N/A;  . VASECTOMY      Current Medications: Outpatient Medications Prior to Visit  Medication Sig Dispense Refill  . atorvastatin (LIPITOR) 10 MG tablet Take 10 mg by mouth daily before breakfast.     . Diclofenac Sodium (SOLARAZE) 3 % GEL Place 1 application onto the skin 2 (two) times daily as needed (Skin).     Steven Conley omeprazole (PRILOSEC) 40 MG capsule Take 40 mg by mouth daily.    . Testosterone (ANDROGEL) 20.25 MG/1.25GM (1.62%) GEL Place 1 application onto the skin daily. To each shoulder daily    . vitamin  B-12 (CYANOCOBALAMIN) 1000 MCG tablet Take 1,000 mcg by mouth every Monday, Wednesday, and Friday.     . warfarin (COUMADIN) 2.5 MG tablet Take 2.5-5 mg by mouth daily. Takes 5mg  on Monday.Takes 2.5mg  on all other days    . diltiazem (CARDIZEM CD) 180 MG 24 hr capsule Take 1 capsule (180 mg total) by mouth daily. Please keep 01/24/16 appointment for further refills 30 capsule 0  . triamcinolone cream (KENALOG) 0.1 % Apply 1 application topically 2 (two) times daily.     No facility-administered medications prior to visit.      Allergies:   Sulfa drugs cross reactors   Social History   Social History  . Marital status: Married    Spouse name: N/A  . Number of children:  N/A  . Years of education: N/A   Social History Main Topics  . Smoking status: Never Smoker  . Smokeless tobacco: Never Used  . Alcohol use Yes     Comment: socially  . Drug use: No  . Sexual activity: Yes   Other Topics Concern  . None   Social History Narrative  . None     Family History:  The patient's family history includes Cancer in his brother, maternal aunt, mother, and sister.   ROS:   Please see the history of present illness.    ROS All other systems reviewed and are negative.  PAD Screen 01/24/2016  Previous PAD dx? Yes  Previous surgical procedure? No  Pain with walking? Yes  Subsides with rest? Yes  Feet/toe relief with dangling? No  Painful, non-healing ulcers? No  Extremities discolored? Yes       PHYSICAL EXAM:   VS:  BP (!) 148/88   Pulse 68   Ht 5\' 9"  (1.753 m)   Wt 186 lb 1.9 oz (84.4 kg)   SpO2 96%   BMI 27.49 kg/m    GEN: Well nourished, well developed, in no acute distress  HEENT: normal  Neck: no JVD, carotid bruits, or masses Cardiac: RRR; no murmurs, rubs, or gallops,no edema.  Intact distal pulses bilaterally.  Respiratory:  clear to auscultation bilaterally, normal work of breathing GI: soft, nontender, nondistended, + BS MS: no deformity or atrophy  Skin: warm and dry, no rash Neuro:  Alert and Oriented x 3, Strength and sensation are intact Psych: euthymic mood, full affect  Wt Readings from Last 3 Encounters:  01/24/16 186 lb 1.9 oz (84.4 kg)  06/01/14 183 lb 1.6 oz (83.1 kg)  04/18/14 183 lb 6 oz (83.2 kg)      Studies/Labs Reviewed:   EKG:  EKG is ordered today.  The ekg ordered today demonstrates NSR with no ST changes  Recent Labs: No results found for requested labs within last 8760 hours.   Lipid Panel No results found for: CHOL, TRIG, HDL, CHOLHDL, VLDL, LDLCALC, LDLDIRECT  Additional studies/ records that were reviewed today include:  none    ASSESSMENT:    1. PAF (paroxysmal atrial fibrillation)  ([place])   2. Essential hypertension   3. History of DVT of lower extremity      PLAN:  In order of problems listed above:  1. PAF maintaining NSR but continues to have intermittent palpitations.  Increase Cardizem to 240mg  daily to suppress.  Continue Coumadin.  His CHADS2VASC score is 1 but he is on coumadin for Factor V Leiden Def. 2. HTN - BP borderline controlled on current meds.  Increase Cardizem to 240mg  daily for better BP control.  I  have asked Conley to check his BP daily for a week and call with the results. 3. H/O DVT on chronic coumadin for life.    Medication Adjustments/Labs and Tests Ordered: Current medicines are reviewed at length with the patient today.  Concerns regarding medicines are outlined above.  Medication changes, Labs and Tests ordered today are listed in the Patient Instructions below.  There are no Patient Instructions on file for this visit.   Signed, Steven Him, MD  01/24/2016 8:23 AM    [address], [place], [place]  [address] Phone: [phone]; Fax: [phone]

## 2016-01-24 NOTE — Patient Instructions (Signed)
Medication Instructions:  1) INCREASE CARDIZEM to 240 mg daily  Labwork: None  Testing/Procedures: None  Follow-Up: Your physician wants you to follow-up in: 1 year with Dr. Radford Pax. You will receive a reminder letter in the mail two months in advance. If you don't receive a letter, please call our office to schedule the follow-up appointment.   Any Other Special Instructions Will Be Listed Below (If Applicable).     If you need a refill on your cardiac medications before your next appointment, please call your pharmacy.

## 2016-03-26 ENCOUNTER — Telehealth: Payer: Self-pay | Admitting: Cardiology

## 2016-03-26 NOTE — Telephone Encounter (Signed)
Patient called to report elevated BP (see below). He states the highest reading (182/115) was taken right after arguing with his son and he was "really stress out." The BPs were usually taken prior to taking medication or when he gets home from work, about 8 hours after. He denies headaches, CP, SOB, dizziness. He states he has broken into afib a couple times since seeing Dr. Radford Pax, but is in a normal rhythm now. The patient will check his BP a couple hours after he takes his medication and will await further instruction on BP meds.  He requests not to add any medications - he refuses to restart Lisinopril and he likes the Cardizem.   To Dr. Radford Pax and [facility] for recommendations.

## 2016-03-26 NOTE — Telephone Encounter (Signed)
New message  Pt is returning call  Pt is wanting to report Bp, still high after chg in med  Bp: 182/115, 177/117, 175/103, 145/99  Please call back

## 2016-03-26 NOTE — Telephone Encounter (Signed)
Left message to call back  

## 2016-03-26 NOTE — Telephone Encounter (Signed)
Follow up ° ° ° ° ° °Returning a call to the nurse °

## 2016-03-27 NOTE — Telephone Encounter (Signed)
Seems like most BP readings are being taken at inappropriate times when they are more likely to be elevated (after arguments, before taking meds etc). Home BP readings not always accurate for pts in afib either. Would need more info regarding BP readings after pt takes meds and when he is at rest. Not sure his BP will be at goal without adding meds which pt seems resistant to doing, and unlikely that a higher dose of diltiazem will bring BP to goal. Since pt refuses to restart lisinopril, would advise a different BP medication like amlodipine 5mg  daily if systolic BP consistently elevated above 140.

## 2016-03-30 NOTE — Telephone Encounter (Signed)
Please order a 24 hour BP monitor

## 2016-03-31 ENCOUNTER — Ambulatory Visit (INDEPENDENT_AMBULATORY_CARE_PROVIDER_SITE_OTHER): Payer: Self-pay | Admitting: Orthopedic Surgery

## 2016-03-31 NOTE — Telephone Encounter (Signed)
Patient states he saw his PCP since last week and was started again on Lisinopril 10 mg a day. He states his PCP will manage HTN and Dr. Radford Pax will manage Afib.   Med list updated.

## 2016-05-01 DIAGNOSIS — N451 Epididymitis: Secondary | ICD-10-CM | POA: Diagnosis not present

## 2016-05-01 DIAGNOSIS — Z7901 Long term (current) use of anticoagulants: Secondary | ICD-10-CM | POA: Diagnosis not present

## 2016-05-01 DIAGNOSIS — I10 Essential (primary) hypertension: Secondary | ICD-10-CM | POA: Diagnosis not present

## 2016-05-01 DIAGNOSIS — Z5181 Encounter for therapeutic drug level monitoring: Secondary | ICD-10-CM | POA: Diagnosis not present

## 2016-05-19 DIAGNOSIS — J302 Other seasonal allergic rhinitis: Secondary | ICD-10-CM | POA: Diagnosis not present

## 2016-05-19 DIAGNOSIS — H1132 Conjunctival hemorrhage, left eye: Secondary | ICD-10-CM | POA: Diagnosis not present

## 2016-06-01 IMAGING — NM NM HEPATO W/GB/PHARM/[PERSON_NAME]
3 series · 13 of 13 positions shown · non-contrast
Comparison: Ultrasound 03/21/2014

CLINICAL DATA: Right upper quadrant abdominal pain that radiates to
the back with intermittent ostia.

EXAM:
NUCLEAR MEDICINE HEPATOBILIARY IMAGING WITH GALLBLADDER EF
TECHNIQUE: Sequential images of the abdomen were obtained [DATE] minutes
following intravenous administration of radiopharmaceutical. After
slow intravenous infusion of 1.7 micrograms Cholecystokinin,
gallbladder ejection fraction was determined.
RADIOPHARMACEUTICALS:  5 [place]-AAm Choletec

[he hepatobiliary · 3.43mm/px · 6 of 30 frames shown (1 of 3)]
[frame 3/30]
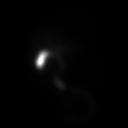
[frame 8/30]
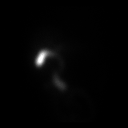
[frame 13/30]
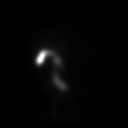
[frame 18/30]
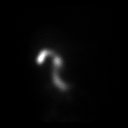
[frame 23/30]
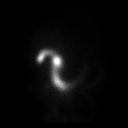
[frame 28/30]
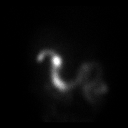

[he hepatobiliary · 3.43mm/px · 6 of 57 frames shown (2 of 3)]
[frame 5/57]
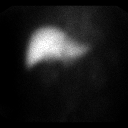
[frame 15/57]
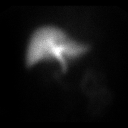
[frame 24/57]
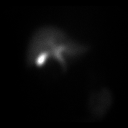
[frame 34/57]
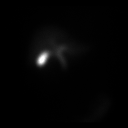
[frame 43/57]
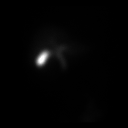
[frame 53/57]
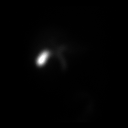

[he hepatobiliary · 1 of 1 slices shown (3 of 3)]
[im 1/1]
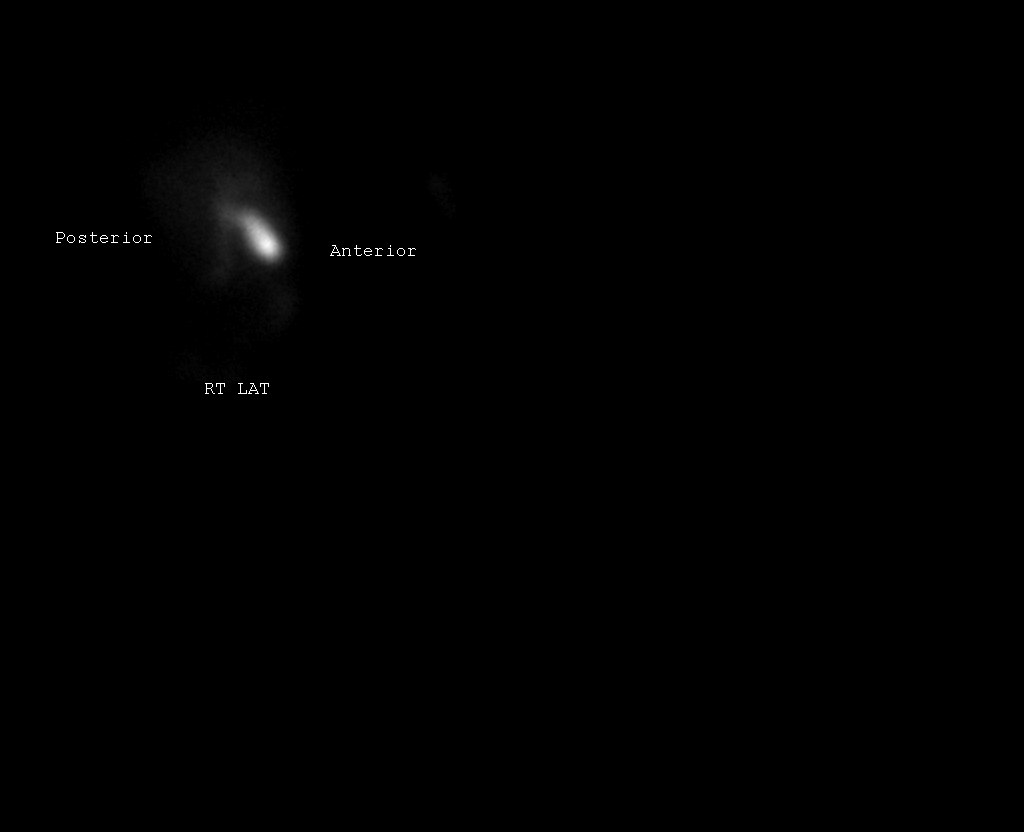

[13 of 13 positions shown; findings below may reference images not displayed]

FINDINGS: Normal uptake in the liver and normal excretion into the biliary
system. Gallbladder filling at 25 min. Delayed images demonstrate
activity in the small bowel. Gallbladder ejection fraction is 88%.
At 30 min, normal ejection fraction is greater than 30%.

The patient did not experience symptoms during CCK infusion.
IMPRESSION: Normal hepatobiliary examination. Normal gallbladder ejection
fraction.

## 2016-06-12 DIAGNOSIS — Z7901 Long term (current) use of anticoagulants: Secondary | ICD-10-CM | POA: Diagnosis not present

## 2016-06-18 DIAGNOSIS — R300 Dysuria: Secondary | ICD-10-CM | POA: Diagnosis not present

## 2016-06-26 DIAGNOSIS — M4802 Spinal stenosis, cervical region: Secondary | ICD-10-CM | POA: Diagnosis not present

## 2016-06-26 DIAGNOSIS — S161XXA Strain of muscle, fascia and tendon at neck level, initial encounter: Secondary | ICD-10-CM | POA: Diagnosis not present

## 2016-07-24 DIAGNOSIS — Z7901 Long term (current) use of anticoagulants: Secondary | ICD-10-CM | POA: Diagnosis not present

## 2016-08-13 DIAGNOSIS — S161XXA Strain of muscle, fascia and tendon at neck level, initial encounter: Secondary | ICD-10-CM | POA: Diagnosis not present

## 2016-08-19 DIAGNOSIS — Z7901 Long term (current) use of anticoagulants: Secondary | ICD-10-CM | POA: Diagnosis not present

## 2016-09-24 DIAGNOSIS — E291 Testicular hypofunction: Secondary | ICD-10-CM | POA: Diagnosis not present

## 2016-09-24 DIAGNOSIS — Z0000 Encounter for general adult medical examination without abnormal findings: Secondary | ICD-10-CM | POA: Diagnosis not present

## 2016-09-24 DIAGNOSIS — Z7901 Long term (current) use of anticoagulants: Secondary | ICD-10-CM | POA: Diagnosis not present

## 2016-09-24 DIAGNOSIS — I10 Essential (primary) hypertension: Secondary | ICD-10-CM | POA: Diagnosis not present

## 2016-09-24 DIAGNOSIS — K219 Gastro-esophageal reflux disease without esophagitis: Secondary | ICD-10-CM | POA: Diagnosis not present

## 2016-09-24 DIAGNOSIS — I480 Paroxysmal atrial fibrillation: Secondary | ICD-10-CM | POA: Diagnosis not present

## 2016-09-24 DIAGNOSIS — E7800 Pure hypercholesterolemia, unspecified: Secondary | ICD-10-CM | POA: Diagnosis not present

## 2016-11-05 DIAGNOSIS — Z7901 Long term (current) use of anticoagulants: Secondary | ICD-10-CM | POA: Diagnosis not present

## 2016-11-27 DIAGNOSIS — M9901 Segmental and somatic dysfunction of cervical region: Secondary | ICD-10-CM | POA: Diagnosis not present

## 2016-11-27 DIAGNOSIS — M545 Low back pain: Secondary | ICD-10-CM | POA: Diagnosis not present

## 2016-11-27 DIAGNOSIS — M542 Cervicalgia: Secondary | ICD-10-CM | POA: Diagnosis not present

## 2016-12-01 DIAGNOSIS — M545 Low back pain: Secondary | ICD-10-CM | POA: Diagnosis not present

## 2016-12-01 DIAGNOSIS — M9901 Segmental and somatic dysfunction of cervical region: Secondary | ICD-10-CM | POA: Diagnosis not present

## 2016-12-01 DIAGNOSIS — M542 Cervicalgia: Secondary | ICD-10-CM | POA: Diagnosis not present

## 2016-12-03 DIAGNOSIS — M9901 Segmental and somatic dysfunction of cervical region: Secondary | ICD-10-CM | POA: Diagnosis not present

## 2016-12-03 DIAGNOSIS — M542 Cervicalgia: Secondary | ICD-10-CM | POA: Diagnosis not present

## 2016-12-03 DIAGNOSIS — M545 Low back pain: Secondary | ICD-10-CM | POA: Diagnosis not present

## 2016-12-08 DIAGNOSIS — M545 Low back pain: Secondary | ICD-10-CM | POA: Diagnosis not present

## 2016-12-08 DIAGNOSIS — M542 Cervicalgia: Secondary | ICD-10-CM | POA: Diagnosis not present

## 2016-12-08 DIAGNOSIS — M9901 Segmental and somatic dysfunction of cervical region: Secondary | ICD-10-CM | POA: Diagnosis not present

## 2016-12-10 DIAGNOSIS — M542 Cervicalgia: Secondary | ICD-10-CM | POA: Diagnosis not present

## 2016-12-10 DIAGNOSIS — M9901 Segmental and somatic dysfunction of cervical region: Secondary | ICD-10-CM | POA: Diagnosis not present

## 2016-12-10 DIAGNOSIS — M545 Low back pain: Secondary | ICD-10-CM | POA: Diagnosis not present

## 2016-12-15 DIAGNOSIS — M545 Low back pain: Secondary | ICD-10-CM | POA: Diagnosis not present

## 2016-12-15 DIAGNOSIS — M542 Cervicalgia: Secondary | ICD-10-CM | POA: Diagnosis not present

## 2016-12-15 DIAGNOSIS — M9901 Segmental and somatic dysfunction of cervical region: Secondary | ICD-10-CM | POA: Diagnosis not present

## 2016-12-17 DIAGNOSIS — Z7901 Long term (current) use of anticoagulants: Secondary | ICD-10-CM | POA: Diagnosis not present

## 2017-01-09 ENCOUNTER — Encounter: Payer: Self-pay | Admitting: Cardiology

## 2017-01-22 ENCOUNTER — Encounter: Payer: Self-pay | Admitting: Cardiology

## 2017-01-22 ENCOUNTER — Ambulatory Visit (INDEPENDENT_AMBULATORY_CARE_PROVIDER_SITE_OTHER): Payer: 59 | Admitting: Cardiology

## 2017-01-22 VITALS — BP 124/84 | HR 69 | Ht 69.0 in | Wt 195.8 lb

## 2017-01-22 DIAGNOSIS — I480 Paroxysmal atrial fibrillation: Secondary | ICD-10-CM

## 2017-01-22 DIAGNOSIS — I10 Essential (primary) hypertension: Secondary | ICD-10-CM

## 2017-01-22 DIAGNOSIS — R5383 Other fatigue: Secondary | ICD-10-CM

## 2017-01-22 DIAGNOSIS — Z86718 Personal history of other venous thrombosis and embolism: Secondary | ICD-10-CM

## 2017-01-22 NOTE — Patient Instructions (Signed)
Medication Instructions:  The current medical regimen is effective;  continue present plan and medications.  Testing/Procedures: Your physician has requested that you have an exercise tolerance test. For further information please visit [email or web address]. Please also follow instruction sheet, as given.  Follow-Up: Follow up in 1 year with Dr. Radford Pax.  You will receive a letter in the mail 2 months before you are due.  Please call us when you receive this letter to schedule your follow up appointment.  If you need a refill on your cardiac medications before your next appointment, please call your pharmacy.  Thank you for choosing [facility]!!

## 2017-01-22 NOTE — Progress Notes (Signed)
Cardiology Office Note:    Date:  01/22/2017   ID:  Nonda Lou, DOB Sep 20, 1960, MRN [id number]  PCP:  Lavone Orn, MD  Cardiologist:  Fransico Him, MD   Referring MD: Lavone Orn, MD   Chief Complaint  Patient presents with  . Atrial Fibrillation  . Hypertension    History of Present Illness:    Steven Conley is a 56 y.o. male with a hx of HTN, dyslipidemia, DVT (with Factor V Leiden) and PAF.  He is here today for followup and is doing well.  He denies any chest pain or pressure, SOB, DOE, PND, orthopnea, LE edema, dizziness or syncope. He is compliant with his meds and is tolerating meds with no SE.  He complains chronic fatigue but has put on 20lbs recently.  He does have OSA but was intolerant to CPAP.  He wants to try diet again.    Past Medical History:  Diagnosis Date  . Blood dyscrasia   . Cancer (HCC)    squamous cell  . Chronic kidney disease    acute renal failure no dialysis due to dehydration  . Colon polyp   . DVT (deep venous thrombosis) ([place])   . Dysrhythmia   . Ejection fraction    .  Marland Kitchen GERD (gastroesophageal reflux disease)   . Hemorrhoid   . Homozygous Factor V Leiden mutation ([place])    Dr. Lenna Sciara. [name]  . Hyperlipidemia   . Hypertension    not currently on BP medication  . PONV (postoperative nausea and vomiting)    no problems with Propofol use  . Sleep apnea 2015   uses CPAP intermittently    Past Surgical History:  Procedure Laterality Date  . ANTERIOR CERVICAL DECOMP/DISCECTOMY FUSION N/A 11/16/2012   Procedure: ANTERIOR CERVICAL DECOMPRESSION/DISCECTOMY FUSION 2 LEVELS;  Surgeon: Charlie Pitter, MD;  Location: [facility];  Service: Neurosurgery;  Laterality: N/A;  Cervical four-five, cervical five-six anterior cervical decompression fusion with PEEK and plate   . BICEPS TENDON REPAIR     right  . BRAVO [place] STUDY  03/10/2012   Procedure: BRAVO [place];  Surgeon: Arta Silence, MD;  [facility];  Service:  Endoscopy;  Laterality: N/A;  . ESOPHAGOGASTRODUODENOSCOPY (EGD) WITH PROPOFOL  03/10/2012   Procedure: ESOPHAGOGASTRODUODENOSCOPY (EGD) WITH PROPOFOL;  Surgeon: Arta Silence, MD;  [facility];  Service: Endoscopy;  Laterality: N/A;  . EYE SURGERY Bilateral    lasik  . FRACTURE SURGERY     left ankle- bursectomy  . HEMORROIDECTOMY  1995  . KNEE ARTHROSCOPY     right  . LAPAROSCOPIC APPENDECTOMY N/A 04/25/2014   Procedure: APPENDECTOMY LAPAROSCOPIC;  Surgeon: Rolm Bookbinder, MD;  Location: [facility];  Service: General;  Laterality: N/A;  . NECK SURGERY    . NOSE SURGERY  1972, 1985  . SHOULDER SURGERY     right  . UMBILICAL HERNIA REPAIR N/A 04/25/2014   Procedure: HERNIA REPAIR UMBILICAL ADULT (PRIMARY);  Surgeon: Rolm Bookbinder, MD;  Location: [facility];  Service: General;  Laterality: N/A;  . VASECTOMY      Current Medications: Current Meds  Medication Sig  . amitriptyline (ELAVIL) 25 MG tablet Take 25 mg by mouth at bedtime.  Marland Kitchen atorvastatin (LIPITOR) 10 MG tablet Take 10 mg by mouth daily before breakfast.   . diltiazem (CARDIZEM CD) 240 MG 24 hr capsule Take 1 capsule (240 mg total) by mouth daily.  Marland Kitchen lisinopril (PRINIVIL,ZESTRIL) 10 MG tablet Take 10 mg by mouth daily.  Marland Kitchen  omeprazole (PRILOSEC) 40 MG capsule Take 40 mg by mouth daily.  Marland Kitchen testosterone (ANDROGEL) 50 MG/5GM (1%) GEL Place 50 g onto the skin daily.  . vitamin B-12 (CYANOCOBALAMIN) 1000 MCG tablet Take 1,000 mcg by mouth every Monday, Wednesday, and Friday.   . warfarin (COUMADIN) 5 MG tablet as directed.     Allergies:   Sulfa drugs cross reactors   Social History   Social History  . Marital status: Married    Spouse name: N/A  . Number of children: N/A  . Years of education: N/A   Social History Main Topics  . Smoking status: Never Smoker  . Smokeless tobacco: Never Used  . Alcohol use Yes     Comment: socially  . Drug use: No  . Sexual activity: Yes   Other Topics Concern  . None    Social History Narrative  . None     Family History: The patient's family history includes Cancer in his brother, maternal aunt, mother, and sister.  ROS:   Please see the history of present illness.    ROS  All other systems reviewed and negative.   EKGs/Labs/Other Studies Reviewed:    The following studies were reviewed today: none  EKG:  EKG is  ordered today.  The ekg ordered today demonstrates NSR with no ST changes  Recent Labs: No results found for requested labs within last 8760 hours.   Recent Lipid Panel No results found for: CHOL, TRIG, HDL, CHOLHDL, VLDL, LDLCALC, LDLDIRECT  Physical Exam:    VS:  BP 124/84   Pulse 69   Ht 5\' 9"  (1.753 m)   Wt 195 lb 12.8 oz (88.8 kg)   SpO2 96%   BMI 28.91 kg/m     Wt Readings from Last 3 Encounters:  01/22/17 195 lb 12.8 oz (88.8 kg)  01/24/16 186 lb 1.9 oz (84.4 kg)  06/01/14 183 lb 1.6 oz (83.1 kg)     GEN:  Well nourished, well developed in no acute distress HEENT: Normal NECK: No JVD; No carotid bruits LYMPHATICS: No lymphadenopathy CARDIAC: RRR, no murmurs, rubs, gallops RESPIRATORY:  Clear to auscultation without rales, wheezing or rhonchi  ABDOMEN: Soft, non-tender, non-distended MUSCULOSKELETAL:  No edema; No deformity  SKIN: Warm and dry NEUROLOGIC:  Alert and oriented x 3 PSYCHIATRIC:  Normal affect   ASSESSMENT:    1. PAF (paroxysmal atrial fibrillation) ([place])   2. Essential hypertension   3. History of DVT of lower extremity    PLAN:    In order of problems listed above:  1.  Paroxysmal atrial fibrillation - he is maintaining NSR.  He has had a few bouts palpitations but not bothersome.  He will continue on warfarin and Cardizem.  2.  HTN - BP well controlled on current meds.  He will continue on Lisinopril 10mg  daily and Cardizem 240mg  daily.    3.  H/O DVT - he will continue on warfarin.   4.  Exertional fatigue - may be due to weight gain recently but will get an ETT to rule out  ischemia.    Medication Adjustments/Labs and Tests Ordered: Current medicines are reviewed at length with the patient today.  Concerns regarding medicines are outlined above.  No orders of the defined types were placed in this encounter.  No orders of the defined types were placed in this encounter.   Signed, Fransico Him, MD  01/22/2017 8:09 AM    [place]

## 2017-02-03 ENCOUNTER — Ambulatory Visit (INDEPENDENT_AMBULATORY_CARE_PROVIDER_SITE_OTHER): Payer: 59

## 2017-02-03 DIAGNOSIS — R5383 Other fatigue: Secondary | ICD-10-CM | POA: Diagnosis not present

## 2017-02-03 DIAGNOSIS — I10 Essential (primary) hypertension: Secondary | ICD-10-CM

## 2017-02-03 DIAGNOSIS — I480 Paroxysmal atrial fibrillation: Secondary | ICD-10-CM | POA: Diagnosis not present

## 2017-02-04 DIAGNOSIS — R361 Hematospermia: Secondary | ICD-10-CM | POA: Diagnosis not present

## 2017-02-04 DIAGNOSIS — N400 Enlarged prostate without lower urinary tract symptoms: Secondary | ICD-10-CM | POA: Diagnosis not present

## 2017-02-04 DIAGNOSIS — N451 Epididymitis: Secondary | ICD-10-CM | POA: Diagnosis not present

## 2017-02-04 LAB — EXERCISE TOLERANCE TEST
CHL CUP RESTING HR STRESS: 62 {beats}/min
CHL RATE OF PERCEIVED EXERTION: 17
CSEPEW: 13.4 METS
CSEPPHR: 162 {beats}/min
Exercise duration (min): 12 min
Exercise duration (sec): 12 s
MPHR: 162 {beats}/min
Percent HR: 85 %

## 2017-02-05 ENCOUNTER — Other Ambulatory Visit: Payer: Self-pay | Admitting: Cardiology

## 2017-02-11 DIAGNOSIS — H11823 Conjunctivochalasis, bilateral: Secondary | ICD-10-CM | POA: Diagnosis not present

## 2017-02-11 DIAGNOSIS — H35413 Lattice degeneration of retina, bilateral: Secondary | ICD-10-CM | POA: Diagnosis not present

## 2017-02-11 DIAGNOSIS — H01021 Squamous blepharitis right upper eyelid: Secondary | ICD-10-CM | POA: Diagnosis not present

## 2017-02-23 DIAGNOSIS — M9903 Segmental and somatic dysfunction of lumbar region: Secondary | ICD-10-CM | POA: Diagnosis not present

## 2017-02-23 DIAGNOSIS — M545 Low back pain: Secondary | ICD-10-CM | POA: Diagnosis not present

## 2017-02-23 DIAGNOSIS — M546 Pain in thoracic spine: Secondary | ICD-10-CM | POA: Diagnosis not present

## 2017-03-03 DIAGNOSIS — M546 Pain in thoracic spine: Secondary | ICD-10-CM | POA: Diagnosis not present

## 2017-03-03 DIAGNOSIS — M9903 Segmental and somatic dysfunction of lumbar region: Secondary | ICD-10-CM | POA: Diagnosis not present

## 2017-03-03 DIAGNOSIS — M545 Low back pain: Secondary | ICD-10-CM | POA: Diagnosis not present

## 2017-03-12 DIAGNOSIS — I10 Essential (primary) hypertension: Secondary | ICD-10-CM | POA: Diagnosis not present

## 2017-03-12 DIAGNOSIS — Z7901 Long term (current) use of anticoagulants: Secondary | ICD-10-CM | POA: Diagnosis not present

## 2017-03-17 DIAGNOSIS — M545 Low back pain: Secondary | ICD-10-CM | POA: Diagnosis not present

## 2017-03-17 DIAGNOSIS — M546 Pain in thoracic spine: Secondary | ICD-10-CM | POA: Diagnosis not present

## 2017-03-17 DIAGNOSIS — M9903 Segmental and somatic dysfunction of lumbar region: Secondary | ICD-10-CM | POA: Diagnosis not present

## 2017-04-02 DIAGNOSIS — Z7901 Long term (current) use of anticoagulants: Secondary | ICD-10-CM | POA: Diagnosis not present

## 2017-05-04 DIAGNOSIS — Z7901 Long term (current) use of anticoagulants: Secondary | ICD-10-CM | POA: Diagnosis not present

## 2017-05-04 DIAGNOSIS — I82591 Chronic embolism and thrombosis of other specified deep vein of right lower extremity: Secondary | ICD-10-CM | POA: Diagnosis not present

## 2017-05-06 DIAGNOSIS — K317 Polyp of stomach and duodenum: Secondary | ICD-10-CM | POA: Diagnosis not present

## 2017-05-06 DIAGNOSIS — Z7901 Long term (current) use of anticoagulants: Secondary | ICD-10-CM | POA: Diagnosis not present

## 2017-05-06 DIAGNOSIS — Z86010 Personal history of colonic polyps: Secondary | ICD-10-CM | POA: Diagnosis not present

## 2017-05-13 DIAGNOSIS — L814 Other melanin hyperpigmentation: Secondary | ICD-10-CM | POA: Diagnosis not present

## 2017-05-13 DIAGNOSIS — L821 Other seborrheic keratosis: Secondary | ICD-10-CM | POA: Diagnosis not present

## 2017-05-13 DIAGNOSIS — L820 Inflamed seborrheic keratosis: Secondary | ICD-10-CM | POA: Diagnosis not present

## 2017-05-13 DIAGNOSIS — D229 Melanocytic nevi, unspecified: Secondary | ICD-10-CM | POA: Diagnosis not present

## 2017-05-26 DIAGNOSIS — H66004 Acute suppurative otitis media without spontaneous rupture of ear drum, recurrent, right ear: Secondary | ICD-10-CM | POA: Diagnosis not present

## 2017-05-26 DIAGNOSIS — J0100 Acute maxillary sinusitis, unspecified: Secondary | ICD-10-CM | POA: Diagnosis not present

## 2017-05-26 DIAGNOSIS — J020 Streptococcal pharyngitis: Secondary | ICD-10-CM | POA: Diagnosis not present

## 2017-05-26 DIAGNOSIS — H6123 Impacted cerumen, bilateral: Secondary | ICD-10-CM | POA: Diagnosis not present

## 2017-06-26 DIAGNOSIS — K317 Polyp of stomach and duodenum: Secondary | ICD-10-CM | POA: Diagnosis not present

## 2017-06-26 DIAGNOSIS — Z86010 Personal history of colonic polyps: Secondary | ICD-10-CM | POA: Diagnosis not present

## 2017-06-26 DIAGNOSIS — D126 Benign neoplasm of colon, unspecified: Secondary | ICD-10-CM | POA: Diagnosis not present

## 2017-07-03 DIAGNOSIS — Z7901 Long term (current) use of anticoagulants: Secondary | ICD-10-CM | POA: Diagnosis not present

## 2017-07-03 DIAGNOSIS — I82591 Chronic embolism and thrombosis of other specified deep vein of right lower extremity: Secondary | ICD-10-CM | POA: Diagnosis not present

## 2017-08-02 DIAGNOSIS — Z7901 Long term (current) use of anticoagulants: Secondary | ICD-10-CM | POA: Diagnosis not present

## 2017-08-02 DIAGNOSIS — I82591 Chronic embolism and thrombosis of other specified deep vein of right lower extremity: Secondary | ICD-10-CM | POA: Diagnosis not present

## 2017-08-05 ENCOUNTER — Ambulatory Visit (INDEPENDENT_AMBULATORY_CARE_PROVIDER_SITE_OTHER): Payer: Self-pay

## 2017-08-05 ENCOUNTER — Ambulatory Visit (INDEPENDENT_AMBULATORY_CARE_PROVIDER_SITE_OTHER): Payer: 59 | Admitting: Orthopedic Surgery

## 2017-08-05 ENCOUNTER — Encounter (INDEPENDENT_AMBULATORY_CARE_PROVIDER_SITE_OTHER): Payer: Self-pay | Admitting: Orthopedic Surgery

## 2017-08-05 DIAGNOSIS — M25561 Pain in right knee: Secondary | ICD-10-CM | POA: Diagnosis not present

## 2017-08-05 DIAGNOSIS — M1711 Unilateral primary osteoarthritis, right knee: Secondary | ICD-10-CM | POA: Diagnosis not present

## 2017-08-06 ENCOUNTER — Encounter (INDEPENDENT_AMBULATORY_CARE_PROVIDER_SITE_OTHER): Payer: Self-pay | Admitting: Orthopedic Surgery

## 2017-08-06 NOTE — Progress Notes (Signed)
Office Visit Note   Patient: Steven Conley           Date of Birth: 02-Jun-1960           MRN: [id number] Visit Date: 08/05/2017 Requested by: Lavone Orn, MD [address] [place], [place] [address] PCP: Lavone Orn, MD  Subjective: Chief Complaint  Patient presents with  . Right Knee - Pain    HPI: Patient presents with right knee pain.  Last seen in 2017.  Medial compartment arthritis was noted at that time.  He states that he tweaked his knee trying to get out of his vehicle in a tight parking space.  Was pretty incapacitated for 1-2 days a week ago but it has improved.  He does have a history of DVT and takes blood thinners.  Currently the pain is manageable.  He did well with gel injections in 2017.  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              ROS: All systems reviewed are negative as they relate to the chief complaint within the history of present illness.  Patient denies  fevers or chills.   Assessment & Plan: Visit Diagnoses:  1. Right knee pain, unspecified chronicity   2. Unilateral primary osteoarthritis, right knee     Plan: Impression is right knee pain medial compartment arthritis exacerbation from twisting injury.  Slight effusion is present.  Radiographs show some but not much in the way of progression of that knee arthritis compared to 2017.  I think he would do well with reinstitution of the gel injections.  He did well with Euflexxa series x3.  We will get that set up for him and see him back in 7-10 days to start that process.  Follow-Up Instructions: Return in about 10 days (around 08/15/2017).   Orders:  Orders Placed This Encounter  Procedures  . XR Knee 1-2 Views Right   No orders of the defined types were placed in this encounter.     Procedures: No procedures performed   Clinical Data: No additional findings.  Objective: Vital Signs: There  were no vitals taken for this visit.  Physical Exam:   Constitutional: Patient appears well-developed HEENT:  Head: Normocephalic Eyes:EOM are normal Neck: Normal range of motion Cardiovascular: Normal rate Pulmonary/chest: Effort normal Neurologic: Patient is alert Skin: Skin is warm Psychiatric: Patient has normal mood and affect    Ortho Exam: Orthopedic exam demonstrates slightly antalgic gait to the right.  Trace right knee effusion is present.  He has full extension and full flexion in both knees.  Collateral cruciate ligaments are stable.  Medial joint line tenderness is present on the right-hand side.  No groin pain with internal/external rotation of the leg.  No asymmetric calf swelling or tenderness noted today.  Pedal pulses palpable.  Quad strength very good bilaterally.  Specialty Comments:  No specialty comments available.  Imaging: Xr Knee 1-2 Views Right  Result Date: 08/06/2017 AP lateral merchant right knee reviewed.  Severe medial compartment arthritis is present along with slight varus alignment.  Milder degenerative changes noted in the patellofemoral and lateral compartments.  No fracture noted.    PMFS History: Patient Active Problem List   Diagnosis Date Noted  . 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  . 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 Ejection fraction   . PAF (  paroxysmal atrial fibrillation) ([place]) 01/09/2013  . Warfarin anticoagulation 01/09/2013  . Spondylosis, cervical, with myelopathy 11/16/2012  . Factor V Leiden mutation ([place]) 09/07/2012  . ARF (acute renal failure) ([place]) 09/07/2012  . SOB (shortness of breath) 09/07/2012  . Chest discomfort 09/07/2012  . HTN (hypertension) 09/07/2012  . GERD (gastroesophageal reflux disease) 09/07/2012  . HLD (hyperlipidemia) 09/07/2012  . History of DVT of lower extremity 09/07/2012  . Internal hemorrhoids with complication 54/00/8676   Past Medical History:    Diagnosis Date  . Blood dyscrasia   . Cancer (HCC)    squamous cell  . Chronic kidney disease    acute renal failure no dialysis due to dehydration  . Colon polyp   . DVT (deep venous thrombosis) ([place])   . Dysrhythmia   . Ejection fraction    .  Marland Kitchen GERD (gastroesophageal reflux disease)   . Hemorrhoid   . Homozygous Factor V Leiden mutation ([place])    Dr. Lenna Sciara. [name]  . Hyperlipidemia   . Hypertension    not currently on BP medication  . PONV (postoperative nausea and vomiting)    no problems with Propofol use  . Sleep apnea 2015   uses CPAP intermittently    Family History  Problem Relation Age of Onset  . Cancer Mother        breast  . Cancer Sister        breast  . Cancer Brother        stomach  . Cancer Maternal Aunt        breast    Past Surgical History:  Procedure Laterality Date  . ANTERIOR CERVICAL DECOMP/DISCECTOMY FUSION N/A 11/16/2012   Procedure: ANTERIOR CERVICAL DECOMPRESSION/DISCECTOMY FUSION 2 LEVELS;  Surgeon: Charlie Pitter, MD;  Location: [facility];  Service: Neurosurgery;  Laterality: N/A;  Cervical four-five, cervical five-six anterior cervical decompression fusion with PEEK and plate   . BICEPS TENDON REPAIR     right  . BRAVO [place] STUDY  03/10/2012   Procedure: BRAVO [place];  Surgeon: Arta Silence, MD;  [facility];  Service: Endoscopy;  Laterality: N/A;  . ESOPHAGOGASTRODUODENOSCOPY (EGD) WITH PROPOFOL  03/10/2012   Procedure: ESOPHAGOGASTRODUODENOSCOPY (EGD) WITH PROPOFOL;  Surgeon: Arta Silence, MD;  [facility];  Service: Endoscopy;  Laterality: N/A;  . EYE SURGERY Bilateral    lasik  . FRACTURE SURGERY     left ankle- bursectomy  . HEMORROIDECTOMY  1995  . KNEE ARTHROSCOPY     right  . LAPAROSCOPIC APPENDECTOMY N/A 04/25/2014   Procedure: APPENDECTOMY LAPAROSCOPIC;  Surgeon: Rolm Bookbinder, MD;  Location: [facility];  Service: General;  Laterality: N/A;  . NECK SURGERY    . NOSE SURGERY  1972, 1985   . SHOULDER SURGERY     right  . UMBILICAL HERNIA REPAIR N/A 04/25/2014   Procedure: HERNIA REPAIR UMBILICAL ADULT (PRIMARY);  Surgeon: Rolm Bookbinder, MD;  Location: [facility];  Service: General;  Laterality: N/A;  . VASECTOMY     Social History   Occupational History  . Not on file  Tobacco Use  . Smoking status: Never Smoker  . Smokeless tobacco: Never Used  Substance and Sexual Activity  . Alcohol use: Yes    Comment: socially  . Drug use: No  . Sexual activity: Yes

## 2017-08-11 ENCOUNTER — Telehealth (INDEPENDENT_AMBULATORY_CARE_PROVIDER_SITE_OTHER): Payer: Self-pay

## 2017-08-11 NOTE — Telephone Encounter (Signed)
Faxed office note to [employer] Benefits Investigation at [phone].

## 2017-08-11 NOTE — Telephone Encounter (Signed)
Faxed completed Euflexxa application for right knee to [phone].

## 2017-08-11 NOTE — Telephone Encounter (Signed)
Talked with patient and advised him that he is approved for Euflexxa injection series, right knee.  [place], co-pay per visit is $40.00 per Wendy May.   Appt.scheduled for 08/24/17, 1st Euflexxa injection.

## 2017-08-24 ENCOUNTER — Ambulatory Visit (INDEPENDENT_AMBULATORY_CARE_PROVIDER_SITE_OTHER): Payer: 59 | Admitting: Orthopedic Surgery

## 2017-08-24 ENCOUNTER — Encounter (INDEPENDENT_AMBULATORY_CARE_PROVIDER_SITE_OTHER): Payer: Self-pay | Admitting: Orthopedic Surgery

## 2017-08-24 DIAGNOSIS — M1711 Unilateral primary osteoarthritis, right knee: Secondary | ICD-10-CM

## 2017-08-24 MED ORDER — SODIUM HYALURONATE (VISCOSUP) 20 MG/2ML IX SOSY
20.0000 mg | PREFILLED_SYRINGE | INTRA_ARTICULAR | Status: AC | PRN
  Administered 2017-08-24: 20 mg via INTRA_ARTICULAR

## 2017-08-24 MED ORDER — LIDOCAINE HCL 1 % IJ SOLN
5.0000 mL | INTRAMUSCULAR | Status: AC | PRN
  Administered 2017-08-24: 5 mL

## 2017-08-24 NOTE — Progress Notes (Signed)
   Procedure Note  Patient: Steven Conley             Date of Birth: 12-18-1960           MRN: [id number]             Visit Date: 08/24/2017  Procedures: Visit Diagnoses: Unilateral primary osteoarthritis, right knee  Large Joint Inj: R knee on 08/24/2017 4:28 PM Indications: diagnostic evaluation, joint swelling and pain Details: 18 G 1.5 in needle, superolateral approach  Arthrogram: No  Medications: 5 mL lidocaine 1 %; 20 mg Sodium Hyaluronate 20 MG/2ML Outcome: tolerated well, no immediate complications Procedure, treatment alternatives, risks and benefits explained, specific risks discussed. Consent was given by the patient. Immediately prior to procedure a time out was called to verify the correct patient, procedure, equipment, support staff and site/side marked as required. Patient was prepped and draped in the usual sterile fashion.

## 2017-09-01 DIAGNOSIS — I82591 Chronic embolism and thrombosis of other specified deep vein of right lower extremity: Secondary | ICD-10-CM | POA: Diagnosis not present

## 2017-09-01 DIAGNOSIS — Z7901 Long term (current) use of anticoagulants: Secondary | ICD-10-CM | POA: Diagnosis not present

## 2017-09-02 DIAGNOSIS — M5416 Radiculopathy, lumbar region: Secondary | ICD-10-CM | POA: Diagnosis not present

## 2017-09-02 DIAGNOSIS — M5414 Radiculopathy, thoracic region: Secondary | ICD-10-CM | POA: Diagnosis not present

## 2017-09-02 DIAGNOSIS — M546 Pain in thoracic spine: Secondary | ICD-10-CM | POA: Diagnosis not present

## 2017-09-03 ENCOUNTER — Ambulatory Visit (INDEPENDENT_AMBULATORY_CARE_PROVIDER_SITE_OTHER): Payer: 59 | Admitting: Orthopedic Surgery

## 2017-09-03 ENCOUNTER — Encounter (INDEPENDENT_AMBULATORY_CARE_PROVIDER_SITE_OTHER): Payer: Self-pay | Admitting: Orthopedic Surgery

## 2017-09-03 DIAGNOSIS — M1711 Unilateral primary osteoarthritis, right knee: Secondary | ICD-10-CM

## 2017-09-06 ENCOUNTER — Encounter (INDEPENDENT_AMBULATORY_CARE_PROVIDER_SITE_OTHER): Payer: Self-pay | Admitting: Orthopedic Surgery

## 2017-09-06 DIAGNOSIS — M1711 Unilateral primary osteoarthritis, right knee: Secondary | ICD-10-CM | POA: Diagnosis not present

## 2017-09-06 MED ORDER — SODIUM HYALURONATE (VISCOSUP) 20 MG/2ML IX SOSY
20.0000 mg | PREFILLED_SYRINGE | INTRA_ARTICULAR | Status: AC | PRN
  Administered 2017-09-06: 20 mg via INTRA_ARTICULAR

## 2017-09-06 MED ORDER — LIDOCAINE HCL 1 % IJ SOLN
5.0000 mL | INTRAMUSCULAR | Status: AC | PRN
  Administered 2017-09-06: 5 mL

## 2017-09-06 NOTE — Progress Notes (Signed)
   Procedure Note  Patient: Steven Conley             Date of Birth: 1961-03-05           MRN: [id number]             Visit Date: 09/03/2017  Procedures: Visit Diagnoses: Unilateral primary osteoarthritis, right knee  Large Joint Inj: R knee on 09/06/2017 8:22 PM Indications: diagnostic evaluation, joint swelling and pain Details: 18 G 1.5 in needle, superolateral approach  Arthrogram: No  Medications: 5 mL lidocaine 1 %; 20 mg Sodium Hyaluronate 20 MG/2ML Aspirate: 20 mL yellow Outcome: tolerated well, no immediate complications Procedure, treatment alternatives, risks and benefits explained, specific risks discussed. Consent was given by the patient. Immediately prior to procedure a time out was called to verify the correct patient, procedure, equipment, support staff and site/side marked as required. Patient was prepped and draped in the usual sterile fashion.

## 2017-09-07 ENCOUNTER — Other Ambulatory Visit: Payer: Self-pay | Admitting: Neurosurgery

## 2017-09-07 DIAGNOSIS — M5414 Radiculopathy, thoracic region: Secondary | ICD-10-CM

## 2017-09-07 DIAGNOSIS — M5416 Radiculopathy, lumbar region: Secondary | ICD-10-CM

## 2017-09-09 ENCOUNTER — Ambulatory Visit (INDEPENDENT_AMBULATORY_CARE_PROVIDER_SITE_OTHER): Payer: 59 | Admitting: Orthopedic Surgery

## 2017-09-09 ENCOUNTER — Encounter (INDEPENDENT_AMBULATORY_CARE_PROVIDER_SITE_OTHER): Payer: Self-pay | Admitting: Orthopedic Surgery

## 2017-09-09 DIAGNOSIS — M1711 Unilateral primary osteoarthritis, right knee: Secondary | ICD-10-CM | POA: Diagnosis not present

## 2017-09-09 MED ORDER — SODIUM HYALURONATE (VISCOSUP) 20 MG/2ML IX SOSY
20.0000 mg | PREFILLED_SYRINGE | INTRA_ARTICULAR | Status: AC | PRN
  Administered 2017-09-09: 20 mg via INTRA_ARTICULAR

## 2017-09-09 MED ORDER — LIDOCAINE HCL 1 % IJ SOLN
5.0000 mL | INTRAMUSCULAR | Status: AC | PRN
  Administered 2017-09-09: 5 mL

## 2017-09-09 NOTE — Progress Notes (Signed)
   Procedure Note  Patient: Steven Conley             Date of Birth: 02/09/1961           MRN: [id number]             Visit Date: 09/09/2017  Procedures: Visit Diagnoses: Unilateral primary osteoarthritis, right knee  Large Joint Inj: R knee on 09/09/2017 8:43 PM Indications: diagnostic evaluation, joint swelling and pain Details: 18 G 1.5 in needle, superolateral approach  Arthrogram: No  Medications: 5 mL lidocaine 1 %; 20 mg Sodium Hyaluronate 20 MG/2ML Outcome: tolerated well, no immediate complications Procedure, treatment alternatives, risks and benefits explained, specific risks discussed. Consent was given by the patient. Immediately prior to procedure a time out was called to verify the correct patient, procedure, equipment, support staff and site/side marked as required. Patient was prepped and draped in the usual sterile fashion.

## 2017-09-14 ENCOUNTER — Ambulatory Visit
Admission: RE | Admit: 2017-09-14 | Discharge: 2017-09-14 | Disposition: A | Discharge status: Home / Self Care | Payer: 59 | Source: Ambulatory Visit | Attending: Neurosurgery | Admitting: Neurosurgery

## 2017-09-14 DIAGNOSIS — M5414 Radiculopathy, thoracic region: Secondary | ICD-10-CM

## 2017-09-14 DIAGNOSIS — M5416 Radiculopathy, lumbar region: Secondary | ICD-10-CM

## 2017-09-14 DIAGNOSIS — M5134 Other intervertebral disc degeneration, thoracic region: Secondary | ICD-10-CM | POA: Diagnosis not present

## 2017-09-14 DIAGNOSIS — M5126 Other intervertebral disc displacement, lumbar region: Secondary | ICD-10-CM | POA: Diagnosis not present

## 2017-09-22 DIAGNOSIS — M5416 Radiculopathy, lumbar region: Secondary | ICD-10-CM | POA: Diagnosis not present

## 2017-09-25 DIAGNOSIS — E7800 Pure hypercholesterolemia, unspecified: Secondary | ICD-10-CM | POA: Diagnosis not present

## 2017-09-25 DIAGNOSIS — K219 Gastro-esophageal reflux disease without esophagitis: Secondary | ICD-10-CM | POA: Diagnosis not present

## 2017-09-25 DIAGNOSIS — E291 Testicular hypofunction: Secondary | ICD-10-CM | POA: Diagnosis not present

## 2017-09-25 DIAGNOSIS — I10 Essential (primary) hypertension: Secondary | ICD-10-CM | POA: Diagnosis not present

## 2017-09-25 DIAGNOSIS — Z0000 Encounter for general adult medical examination without abnormal findings: Secondary | ICD-10-CM | POA: Diagnosis not present

## 2017-10-01 DIAGNOSIS — Z7901 Long term (current) use of anticoagulants: Secondary | ICD-10-CM | POA: Diagnosis not present

## 2017-10-01 DIAGNOSIS — I82591 Chronic embolism and thrombosis of other specified deep vein of right lower extremity: Secondary | ICD-10-CM | POA: Diagnosis not present

## 2017-10-31 DIAGNOSIS — I82591 Chronic embolism and thrombosis of other specified deep vein of right lower extremity: Secondary | ICD-10-CM | POA: Diagnosis not present

## 2017-10-31 DIAGNOSIS — Z7901 Long term (current) use of anticoagulants: Secondary | ICD-10-CM | POA: Diagnosis not present

## 2017-11-30 DIAGNOSIS — I82591 Chronic embolism and thrombosis of other specified deep vein of right lower extremity: Secondary | ICD-10-CM | POA: Diagnosis not present

## 2017-11-30 DIAGNOSIS — Z7901 Long term (current) use of anticoagulants: Secondary | ICD-10-CM | POA: Diagnosis not present

## 2017-12-16 ENCOUNTER — Ambulatory Visit (INDEPENDENT_AMBULATORY_CARE_PROVIDER_SITE_OTHER): Payer: 59 | Admitting: Orthopedic Surgery

## 2017-12-16 ENCOUNTER — Encounter (INDEPENDENT_AMBULATORY_CARE_PROVIDER_SITE_OTHER): Payer: Self-pay | Admitting: Orthopedic Surgery

## 2017-12-16 ENCOUNTER — Telehealth (INDEPENDENT_AMBULATORY_CARE_PROVIDER_SITE_OTHER): Payer: Self-pay

## 2017-12-16 DIAGNOSIS — M1711 Unilateral primary osteoarthritis, right knee: Secondary | ICD-10-CM | POA: Diagnosis not present

## 2017-12-16 MED ORDER — METHYLPREDNISOLONE ACETATE 40 MG/ML IJ SUSP
40.0000 mg | INTRAMUSCULAR | Status: AC | PRN
  Administered 2017-12-16: 40 mg via INTRA_ARTICULAR

## 2017-12-16 MED ORDER — BUPIVACAINE HCL 0.25 % IJ SOLN
4.0000 mL | INTRAMUSCULAR | Status: AC | PRN
  Administered 2017-12-16: 4 mL via INTRA_ARTICULAR

## 2017-12-16 MED ORDER — LIDOCAINE HCL 1 % IJ SOLN
5.0000 mL | INTRAMUSCULAR | Status: AC | PRN
  Administered 2017-12-16: 5 mL

## 2017-12-16 NOTE — Telephone Encounter (Signed)
Can we get patient pre approved for gel injection?

## 2017-12-16 NOTE — Progress Notes (Signed)
   Procedure Note  Patient: Steven Conley             Date of Birth: Nov 07, 1960           MRN: [id number]             Visit Date: 12/16/2017  Procedures: Visit Diagnoses: Unilateral primary osteoarthritis, right knee  Large Joint Inj: R knee on 12/16/2017 12:28 PM Indications: diagnostic evaluation, joint swelling and pain Details: 18 G 1.5 in needle, superolateral approach  Arthrogram: No  Medications: 5 mL lidocaine 1 %; 40 mg methylPREDNISolone acetate 40 MG/ML; 4 mL bupivacaine 0.25 % Outcome: tolerated well, no immediate complications Procedure, treatment alternatives, risks and benefits explained, specific risks discussed. Consent was given by the patient. Immediately prior to procedure a time out was called to verify the correct patient, procedure, equipment, support staff and site/side marked as required. Patient was prepped and draped in the usual sterile fashion.

## 2017-12-21 ENCOUNTER — Telehealth (INDEPENDENT_AMBULATORY_CARE_PROVIDER_SITE_OTHER): Payer: Self-pay

## 2017-12-21 NOTE — Telephone Encounter (Signed)
Submitted VOB for Monovisc, right knee. 

## 2017-12-21 NOTE — Telephone Encounter (Signed)
Noted  

## 2017-12-29 ENCOUNTER — Telehealth (INDEPENDENT_AMBULATORY_CARE_PROVIDER_SITE_OTHER): Payer: Self-pay

## 2017-12-29 NOTE — Telephone Encounter (Signed)
Submitted patient Rx form to [employer] to obtain through [facility] under Medical Benefits. Faxed Rx for Monovisc, right knee to [employer] at [phone]

## 2017-12-29 NOTE — Telephone Encounter (Signed)
error 

## 2018-01-07 ENCOUNTER — Other Ambulatory Visit: Payer: Self-pay | Admitting: Cardiology

## 2018-01-07 ENCOUNTER — Telehealth (INDEPENDENT_AMBULATORY_CARE_PROVIDER_SITE_OTHER): Payer: Self-pay

## 2018-01-07 ENCOUNTER — Telehealth (INDEPENDENT_AMBULATORY_CARE_PROVIDER_SITE_OTHER): Payer: Self-pay | Admitting: Orthopedic Surgery

## 2018-01-07 NOTE — Telephone Encounter (Signed)
Patient called in regard to knee and wanted to get handicap sticker due to the amount of pressure putting on knee.  [phone]

## 2018-01-07 NOTE — Telephone Encounter (Signed)
Faxed completed enrollment form to [employer] at  [phone].

## 2018-01-08 NOTE — Telephone Encounter (Signed)
Y 6 mos

## 2018-01-08 NOTE — Telephone Encounter (Signed)
[place] for placard? If so, how long?

## 2018-01-08 NOTE — Telephone Encounter (Signed)
Romie Minus, nurse for clinical coverage review area at [facility] called and said that the injection has been approved with a start date of 11/15, since that would be the 6 month date after patients last one. She said if you have any questions please give her a call # [phone]   case # [id number]

## 2018-01-08 NOTE — Telephone Encounter (Signed)
IC advised could pick up at front desk.  

## 2018-01-12 NOTE — Telephone Encounter (Signed)
Talked with Romie Minus with [facility] to clarify message concerning approval for Monovisc, right knee.

## 2018-01-13 ENCOUNTER — Telehealth (INDEPENDENT_AMBULATORY_CARE_PROVIDER_SITE_OTHER): Payer: Self-pay

## 2018-01-13 NOTE — Telephone Encounter (Signed)
Received a fax from [facility] stating that they will contact patient to obtain their consent to have Monovisc, right knee delivered to our office.

## 2018-01-20 DIAGNOSIS — K649 Unspecified hemorrhoids: Secondary | ICD-10-CM | POA: Diagnosis not present

## 2018-01-26 ENCOUNTER — Ambulatory Visit: Payer: 59 | Admitting: Cardiology

## 2018-01-26 ENCOUNTER — Encounter: Payer: Self-pay | Admitting: Cardiology

## 2018-01-26 VITALS — BP 116/74 | HR 63 | Ht 69.0 in | Wt 197.6 lb

## 2018-01-26 DIAGNOSIS — I480 Paroxysmal atrial fibrillation: Secondary | ICD-10-CM

## 2018-01-26 DIAGNOSIS — I10 Essential (primary) hypertension: Secondary | ICD-10-CM

## 2018-01-26 NOTE — Progress Notes (Signed)
Cardiology Office Note:    Date:  01/26/2018   ID:  Nonda Lou, DOB 26-Oct-1960, MRN [id number]  PCP:  Lavone Orn, MD  Cardiologist:  Fransico Him, MD    Referring MD: Lavone Orn, MD   Chief Complaint  Patient presents with  . Atrial Fibrillation  . Hypertension    History of Present Illness:    Steven Conley is a 57 y.o. male with a hx of  HTN, dyslipidemia, DVT (with Factor V Leiden) and PAF.  He is here today for followup and is doing well.  He denies any chest pain or pressure, SOB, DOE, PND, orthopnea, LE edema, dizziness, palpitations or syncope. He is compliant with his meds and is tolerating meds with no SE.    Past Medical History:  Diagnosis Date  . Blood dyscrasia   . Cancer (HCC)    squamous cell  . Chronic kidney disease    acute renal failure no dialysis due to dehydration  . Colon polyp   . DVT (deep venous thrombosis) ([place])   . Dysrhythmia   . Ejection fraction    .  Marland Kitchen GERD (gastroesophageal reflux disease)   . Hemorrhoid   . Homozygous Factor V Leiden mutation ([place])    Dr. Lenna Sciara. [name]  . Hyperlipidemia   . Hypertension    not currently on BP medication  . PONV (postoperative nausea and vomiting)    no problems with Propofol use  . Sleep apnea 2015   uses CPAP intermittently    Past Surgical History:  Procedure Laterality Date  . ANTERIOR CERVICAL DECOMP/DISCECTOMY FUSION N/A 11/16/2012   Procedure: ANTERIOR CERVICAL DECOMPRESSION/DISCECTOMY FUSION 2 LEVELS;  Surgeon: Charlie Pitter, MD;  Location: [facility];  Service: Neurosurgery;  Laterality: N/A;  Cervical four-five, cervical five-six anterior cervical decompression fusion with PEEK and plate   . BICEPS TENDON REPAIR     right  . BRAVO [place] STUDY  03/10/2012   Procedure: BRAVO [place];  Surgeon: Arta Silence, MD;  Location: [facility];  Service: Endoscopy;  Laterality: N/A;  . ESOPHAGOGASTRODUODENOSCOPY (EGD) WITH PROPOFOL  03/10/2012   Procedure:  ESOPHAGOGASTRODUODENOSCOPY (EGD) WITH PROPOFOL;  Surgeon: Arta Silence, MD;  Location: [facility];  Service: Endoscopy;  Laterality: N/A;  . EYE SURGERY Bilateral    lasik  . FRACTURE SURGERY     left ankle- bursectomy  . HEMORROIDECTOMY  1995  . KNEE ARTHROSCOPY     right  . LAPAROSCOPIC APPENDECTOMY N/A 04/25/2014   Procedure: APPENDECTOMY LAPAROSCOPIC;  Surgeon: Rolm Bookbinder, MD;  Location: [facility];  Service: General;  Laterality: N/A;  . NECK SURGERY    . NOSE SURGERY  1972, 1985  . SHOULDER SURGERY     right  . UMBILICAL HERNIA REPAIR N/A 04/25/2014   Procedure: HERNIA REPAIR UMBILICAL ADULT (PRIMARY);  Surgeon: Rolm Bookbinder, MD;  Location: [facility];  Service: General;  Laterality: N/A;  . VASECTOMY      Current Medications: Current Meds  Medication Sig  . atorvastatin (LIPITOR) 10 MG tablet Take 10 mg by mouth daily before breakfast.   . diltiazem (CARDIZEM CD) 240 MG 24 hr capsule Take 1 capsule (240 mg total) by mouth daily.  Marland Kitchen lisinopril (PRINIVIL,ZESTRIL) 10 MG tablet Take 10 mg by mouth daily.  Marland Kitchen omeprazole (PRILOSEC) 40 MG capsule Take 40 mg by mouth daily.  Marland Kitchen testosterone (ANDROGEL) 50 MG/5GM (1%) GEL Place 50 g onto the skin daily.  Marland Kitchen warfarin (COUMADIN) 5 MG tablet as directed.  Allergies:   Sulfa drugs cross reactors   Social History   Socioeconomic History  . Marital status: Married    Spouse name: Not on file  . Number of children: Not on file  . Years of education: Not on file  . Highest education level: Not on file  Occupational History  . Not on file  Social Needs  . Financial resource strain: Not on file  . Food insecurity:    Worry: Not on file    Inability: Not on file  . Transportation needs:    Medical: Not on file    Non-medical: Not on file  Tobacco Use  . Smoking status: Never Smoker  . Smokeless tobacco: Never Used  Substance and Sexual Activity  . Alcohol use: Yes    Comment: socially  . Drug use: No  . Sexual  activity: Yes  Lifestyle  . Physical activity:    Days per week: Not on file    Minutes per session: Not on file  . Stress: Not on file  Relationships  . Social connections:    Talks on phone: Not on file    Gets together: Not on file    Attends religious service: Not on file    Active member of club or organization: Not on file    Attends meetings of clubs or organizations: Not on file    Relationship status: Not on file  Other Topics Concern  . Not on file  Social History Narrative  . Not on file     Family History: The patient's family history includes Cancer in his brother, maternal aunt, mother, and sister.  ROS:   Please see the history of present illness.    ROS  All other systems reviewed and negative.   EKGs/Labs/Other Studies Reviewed:    The following studies were reviewed today: none  EKG:  EKG is  ordered today.  The ekg ordered today demonstrates sinus rhythm at 63 bpm with no ST changes.  Recent Labs: No results found for requested labs within last 8760 hours.   Recent Lipid Panel No results found for: CHOL, TRIG, HDL, CHOLHDL, VLDL, LDLCALC, LDLDIRECT  Physical Exam:    VS:  BP 116/74   Pulse 63   Ht 5\' 9"  (1.753 m)   Wt 197 lb 9.6 oz (89.6 kg)   BMI 29.18 kg/m     Wt Readings from Last 3 Encounters:  01/26/18 197 lb 9.6 oz (89.6 kg)  01/22/17 195 lb 12.8 oz (88.8 kg)  01/24/16 186 lb 1.9 oz (84.4 kg)     GEN:  Well nourished, well developed in no acute distress HEENT: Normal NECK: No JVD; No carotid bruits LYMPHATICS: No lymphadenopathy CARDIAC: RRR, no murmurs, rubs, gallops RESPIRATORY:  Clear to auscultation without rales, wheezing or rhonchi  ABDOMEN: Soft, non-tender, non-distended MUSCULOSKELETAL:  No edema; No deformity  SKIN: Warm and dry NEUROLOGIC:  Alert and oriented x 3 PSYCHIATRIC:  Normal affect   ASSESSMENT:    1. PAF (paroxysmal atrial fibrillation) ([place])   2. Essential hypertension    PLAN:    In order of  problems listed above:  1.  PAF - he is maintaining normal sinus rhythm on exam.  He will continue on Cardizem CD 240 mg daily and warfarin followed Laurann Montana.  I encouraged him to talk to Dr. Laurann Montana about possibly changing to 1 of the DO(ACs which have a better bleeding safety profile  2.  HTN -BP is well controlled on exam today.  He will continue on lisinopril 10 mg daily and Cardizem CD 240 mg daily.   Medication Adjustments/Labs and Tests Ordered: Current medicines are reviewed at length with the patient today.  Concerns regarding medicines are outlined above.  No orders of the defined types were placed in this encounter.  No orders of the defined types were placed in this encounter.   Signed, Fransico Him, MD  01/26/2018 4:24 PM    [place]

## 2018-01-26 NOTE — Patient Instructions (Signed)
Medication Instructions:  Your physician recommends that you continue on your current medications as directed. Please refer to the Current Medication list given to you today.  Follow-Up: Your physician wants you to follow-up in: 1 year with Dr. Radford Pax.  You will receive a reminder letter in the mail two months in advance. If you don't receive a letter, please call our office to schedule the follow-up appointment.  If you need a refill on your cardiac medications before your next appointment, please call your pharmacy.

## 2018-01-27 ENCOUNTER — Telehealth (INDEPENDENT_AMBULATORY_CARE_PROVIDER_SITE_OTHER): Payer: Self-pay

## 2018-01-27 NOTE — Telephone Encounter (Signed)
Called and left a VM for patient to call [facility] to give his consent to have Monovisc gel injection delivered to our office.  Provided [facility]# [phone]

## 2018-01-28 ENCOUNTER — Telehealth (INDEPENDENT_AMBULATORY_CARE_PROVIDER_SITE_OTHER): Payer: Self-pay

## 2018-01-28 NOTE — Telephone Encounter (Signed)
Talked with patient concerning gel injection.  Patient stated that he will call [facility] and give consent in November 2019 due to medication having a short shelf life.  Appt.scheduled for 12/4/20149.

## 2018-02-04 ENCOUNTER — Other Ambulatory Visit: Payer: Self-pay | Admitting: Cardiology

## 2018-03-16 DIAGNOSIS — H11823 Conjunctivochalasis, bilateral: Secondary | ICD-10-CM | POA: Diagnosis not present

## 2018-03-16 DIAGNOSIS — H35413 Lattice degeneration of retina, bilateral: Secondary | ICD-10-CM | POA: Diagnosis not present

## 2018-03-16 DIAGNOSIS — H01021 Squamous blepharitis right upper eyelid: Secondary | ICD-10-CM | POA: Diagnosis not present

## 2018-03-22 DIAGNOSIS — M1711 Unilateral primary osteoarthritis, right knee: Secondary | ICD-10-CM | POA: Diagnosis not present

## 2018-03-22 DIAGNOSIS — I10 Essential (primary) hypertension: Secondary | ICD-10-CM | POA: Diagnosis not present

## 2018-03-22 DIAGNOSIS — K219 Gastro-esophageal reflux disease without esophagitis: Secondary | ICD-10-CM | POA: Diagnosis not present

## 2018-03-25 DIAGNOSIS — M1711 Unilateral primary osteoarthritis, right knee: Secondary | ICD-10-CM | POA: Diagnosis not present

## 2018-03-29 ENCOUNTER — Telehealth (INDEPENDENT_AMBULATORY_CARE_PROVIDER_SITE_OTHER): Payer: Self-pay

## 2018-03-29 NOTE — Telephone Encounter (Signed)
Talked with Lyn with [facility] and was advised that Monovisc injection would be delivered on Tuesday, 03/30/2018.

## 2018-03-31 ENCOUNTER — Ambulatory Visit (INDEPENDENT_AMBULATORY_CARE_PROVIDER_SITE_OTHER): Payer: 59 | Admitting: Orthopedic Surgery

## 2018-04-14 ENCOUNTER — Encounter (INDEPENDENT_AMBULATORY_CARE_PROVIDER_SITE_OTHER): Payer: Self-pay | Admitting: Orthopedic Surgery

## 2018-04-14 ENCOUNTER — Ambulatory Visit (INDEPENDENT_AMBULATORY_CARE_PROVIDER_SITE_OTHER): Payer: 59 | Admitting: Orthopedic Surgery

## 2018-04-14 DIAGNOSIS — M1711 Unilateral primary osteoarthritis, right knee: Secondary | ICD-10-CM | POA: Diagnosis not present

## 2018-04-18 ENCOUNTER — Encounter (INDEPENDENT_AMBULATORY_CARE_PROVIDER_SITE_OTHER): Payer: Self-pay | Admitting: Orthopedic Surgery

## 2018-04-18 DIAGNOSIS — M1711 Unilateral primary osteoarthritis, right knee: Secondary | ICD-10-CM

## 2018-04-18 MED ORDER — LIDOCAINE HCL 1 % IJ SOLN
5.0000 mL | INTRAMUSCULAR | Status: AC | PRN
  Administered 2018-04-18: 5 mL

## 2018-04-18 MED ORDER — HYALURONAN 88 MG/4ML IX SOSY
88.0000 mg | PREFILLED_SYRINGE | INTRA_ARTICULAR | Status: AC | PRN
  Administered 2018-04-18: 88 mg via INTRA_ARTICULAR

## 2018-04-18 NOTE — Progress Notes (Signed)
   Procedure Note  Patient: Steven Conley             Date of Birth: [birth year]           MRN: [id number]             Visit Date: 04/14/2018  Procedures: Visit Diagnoses: Primary osteoarthritis of right knee  Unilateral primary osteoarthritis, right knee  Large Joint Inj: R knee on 04/18/2018 7:34 PM Indications: pain, joint swelling and diagnostic evaluation Details: 18 G 1.5 in needle, superolateral approach  Arthrogram: No  Medications: 5 mL lidocaine 1 %; 88 mg Hyaluronan 88 MG/4ML Outcome: tolerated well, no immediate complications Procedure, treatment alternatives, risks and benefits explained, specific risks discussed. Consent was given by the patient. Immediately prior to procedure a time out was called to verify the correct patient, procedure, equipment, support staff and site/side marked as required. Patient was prepped and draped in the usual sterile fashion.     Patient presents for right knee planned Monovisc injection.  Come back in late February for cortisone injection.  He wants to try to delay knee replacement or partial knee replacement as long as possible

## 2018-05-04 DIAGNOSIS — R350 Frequency of micturition: Secondary | ICD-10-CM | POA: Diagnosis not present

## 2018-05-12 DIAGNOSIS — D235 Other benign neoplasm of skin of trunk: Secondary | ICD-10-CM | POA: Diagnosis not present

## 2018-05-12 DIAGNOSIS — D225 Melanocytic nevi of trunk: Secondary | ICD-10-CM | POA: Diagnosis not present

## 2018-05-12 DIAGNOSIS — R350 Frequency of micturition: Secondary | ICD-10-CM | POA: Diagnosis not present

## 2018-05-12 DIAGNOSIS — R300 Dysuria: Secondary | ICD-10-CM | POA: Diagnosis not present

## 2018-05-12 DIAGNOSIS — L570 Actinic keratosis: Secondary | ICD-10-CM | POA: Diagnosis not present

## 2018-05-12 DIAGNOSIS — D485 Neoplasm of uncertain behavior of skin: Secondary | ICD-10-CM | POA: Diagnosis not present

## 2018-05-12 DIAGNOSIS — L814 Other melanin hyperpigmentation: Secondary | ICD-10-CM | POA: Diagnosis not present

## 2018-05-12 DIAGNOSIS — L821 Other seborrheic keratosis: Secondary | ICD-10-CM | POA: Diagnosis not present

## 2018-05-12 DIAGNOSIS — N401 Enlarged prostate with lower urinary tract symptoms: Secondary | ICD-10-CM | POA: Diagnosis not present

## 2018-06-16 ENCOUNTER — Telehealth (INDEPENDENT_AMBULATORY_CARE_PROVIDER_SITE_OTHER): Payer: Self-pay

## 2018-06-16 ENCOUNTER — Ambulatory Visit (INDEPENDENT_AMBULATORY_CARE_PROVIDER_SITE_OTHER): Payer: 59 | Admitting: Orthopedic Surgery

## 2018-06-16 ENCOUNTER — Encounter (INDEPENDENT_AMBULATORY_CARE_PROVIDER_SITE_OTHER): Payer: Self-pay | Admitting: Orthopedic Surgery

## 2018-06-16 DIAGNOSIS — M1711 Unilateral primary osteoarthritis, right knee: Secondary | ICD-10-CM

## 2018-06-16 MED ORDER — BUPIVACAINE HCL 0.25 % IJ SOLN
4.0000 mL | INTRAMUSCULAR | Status: AC | PRN
  Administered 2018-06-16: 4 mL via INTRA_ARTICULAR

## 2018-06-16 MED ORDER — METHYLPREDNISOLONE ACETATE 40 MG/ML IJ SUSP
40.0000 mg | INTRAMUSCULAR | Status: AC | PRN
  Administered 2018-06-16: 40 mg via INTRA_ARTICULAR

## 2018-06-16 MED ORDER — LIDOCAINE HCL 1 % IJ SOLN
5.0000 mL | INTRAMUSCULAR | Status: AC | PRN
  Administered 2018-06-16: 5 mL

## 2018-06-16 NOTE — Progress Notes (Signed)
Office Visit Note   Patient: Steven Conley           Date of Birth: 03/28/1961           MRN: [id number] Visit Date: 06/16/2018 Requested by: Lavone Orn, MD [address] [place], [place] [address] PCP: Lavone Orn, MD  Subjective: Chief Complaint  Patient presents with  . Knee Pain    HPI: Patient is a 58 year old with known right knee arthritis.  He is lost some weight since have seen him.  In general his knee is pretty functional.  Had Monovisc injected in mid December.  Presents now for evaluation and management of mild right knee pain and swelling.  Denies any interval history of injury.              ROS: All systems reviewed are negative as they relate to the chief complaint within the history of present illness.  Patient denies  fevers or chills.   Assessment & Plan: Visit Diagnoses:  1. Unilateral primary osteoarthritis, right knee     Plan: Impression is mildly symptomatic right knee arthritis primarily in the medial compartment.  Patient does not have a flexion contracture and has mild effusion.  Aspiration injection performed today.  I think when he has recurrent symptoms after this we could do another Monovisc injection in 3 or 4 months.  We will see him back at that time if required.  Follow-Up Instructions: Return if symptoms worsen or fail to improve.   Orders:  No orders of the defined types were placed in this encounter.  No orders of the defined types were placed in this encounter.     Procedures: Large Joint Inj: R knee on 06/16/2018 8:41 AM Indications: diagnostic evaluation, joint swelling and pain Details: 18 G 1.5 in needle, superolateral approach  Arthrogram: No  Medications: 5 mL lidocaine 1 %; 40 mg methylPREDNISolone acetate 40 MG/ML; 4 mL bupivacaine 0.25 % Outcome: tolerated well, no immediate complications Procedure, treatment alternatives, risks and benefits explained, specific risks discussed. Consent was given by the  patient. Immediately prior to procedure a time out was called to verify the correct patient, procedure, equipment, support staff and site/side marked as required. Patient was prepped and draped in the usual sterile fashion.       Clinical Data: No additional findings.  Objective: Vital Signs: There were no vitals taken for this visit.  Physical Exam:   Constitutional: Patient appears well-developed HEENT:  Head: Normocephalic Eyes:EOM are normal Neck: Normal range of motion Cardiovascular: Normal rate Pulmonary/chest: Effort normal Neurologic: Patient is alert Skin: Skin is warm Psychiatric: Patient has normal mood and affect    Ortho Exam: Ortho exam demonstrates full active and passive range of motion of that right knee.  He does have mild effusion stable collateral cruciate ligaments.  Pedal pulses palpable.  No calf tenderness today.  Specialty Comments:  No specialty comments available.  Imaging: No results found.   PMFS History: Patient Active Problem List   Diagnosis Date Noted  . 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  . 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 ([place]) 05/16/2014  . Ejection fraction   . PAF (paroxysmal atrial fibrillation) ([place]) 01/09/2013  . Warfarin anticoagulation 01/09/2013  . Spondylosis, cervical, with myelopathy 11/16/2012  . Factor V Leiden mutation ([place]) 09/07/2012  . ARF (acute renal failure) ([place]) 09/07/2012  . SOB (shortness of breath) 09/07/2012  . Chest discomfort 09/07/2012  . HTN (hypertension) 09/07/2012  . GERD (gastroesophageal  reflux disease) 09/07/2012  . HLD (hyperlipidemia) 09/07/2012  . History of DVT of lower extremity 09/07/2012  . Internal hemorrhoids with complication 70/17/7939   Past Medical History:  Diagnosis Date  . Blood dyscrasia   . Cancer (HCC)    squamous cell  . Chronic kidney disease    acute renal failure no dialysis due to dehydration  . Colon polyp   . DVT (deep venous  thrombosis) ([place])   . Dysrhythmia   . Ejection fraction    .  Marland Kitchen GERD (gastroesophageal reflux disease)   . Hemorrhoid   . Homozygous Factor V Leiden mutation ([place])    Dr. Lenna Sciara. [name]  . Hyperlipidemia   . Hypertension    not currently on BP medication  . PONV (postoperative nausea and vomiting)    no problems with Propofol use  . Sleep apnea 2015   uses CPAP intermittently    Family History  Problem Relation Age of Onset  . Cancer Mother        breast  . Cancer Sister        breast  . Cancer Brother        stomach  . Cancer Maternal Aunt        breast    Past Surgical History:  Procedure Laterality Date  . ANTERIOR CERVICAL DECOMP/DISCECTOMY FUSION N/A 11/16/2012   Procedure: ANTERIOR CERVICAL DECOMPRESSION/DISCECTOMY FUSION 2 LEVELS;  Surgeon: Charlie Pitter, MD;  Location: [facility];  Service: Neurosurgery;  Laterality: N/A;  Cervical four-five, cervical five-six anterior cervical decompression fusion with PEEK and plate   . BICEPS TENDON REPAIR     right  . BRAVO [place] STUDY  03/10/2012   Procedure: BRAVO [place];  Surgeon: Arta Silence, MD;  [facility];  Service: Endoscopy;  Laterality: N/A;  . ESOPHAGOGASTRODUODENOSCOPY (EGD) WITH PROPOFOL  03/10/2012   Procedure: ESOPHAGOGASTRODUODENOSCOPY (EGD) WITH PROPOFOL;  Surgeon: Arta Silence, MD;  [facility];  Service: Endoscopy;  Laterality: N/A;  . EYE SURGERY Bilateral    lasik  . FRACTURE SURGERY     left ankle- bursectomy  . HEMORROIDECTOMY  1995  . KNEE ARTHROSCOPY     right  . LAPAROSCOPIC APPENDECTOMY N/A 04/25/2014   Procedure: APPENDECTOMY LAPAROSCOPIC;  Surgeon: Rolm Bookbinder, MD;  Location: [facility];  Service: General;  Laterality: N/A;  . NECK SURGERY    . NOSE SURGERY  1972, 1985  . SHOULDER SURGERY     right  . UMBILICAL HERNIA REPAIR N/A 04/25/2014   Procedure: HERNIA REPAIR UMBILICAL ADULT (PRIMARY);  Surgeon: Rolm Bookbinder, MD;  [facility];  Service:  General;  Laterality: N/A;  . VASECTOMY     Social History   Occupational History  . Not on file  Tobacco Use  . Smoking status: Never Smoker  . Smokeless tobacco: Never Used  Substance and Sexual Activity  . Alcohol use: Yes    Comment: socially  . Drug use: No  . Sexual activity: Yes

## 2018-06-16 NOTE — Telephone Encounter (Signed)
Noted.  Will submit at the end of Landree Fernholz 2020.

## 2018-06-16 NOTE — Telephone Encounter (Signed)
Can we please get patient approved for repeat right knee gel monovisc injection? thanks

## 2018-08-19 DIAGNOSIS — R3915 Urgency of urination: Secondary | ICD-10-CM | POA: Diagnosis not present

## 2018-09-17 ENCOUNTER — Telehealth: Payer: Self-pay

## 2018-09-17 NOTE — Telephone Encounter (Signed)
Submitted VOB for Durolane, right knee. 

## 2018-09-22 ENCOUNTER — Telehealth: Payer: Self-pay

## 2018-09-22 NOTE — Telephone Encounter (Signed)
Patient is approved for Durolane, right knee. Buy & Bill Covered at 100% of the allowable amount. Co-pay of $50.00 No PA required  Appt. 10/18/2018 with Dr. Marlou Sa

## 2018-10-18 ENCOUNTER — Ambulatory Visit (INDEPENDENT_AMBULATORY_CARE_PROVIDER_SITE_OTHER): Payer: 59 | Admitting: Orthopedic Surgery

## 2018-10-18 ENCOUNTER — Encounter: Payer: Self-pay | Admitting: Orthopedic Surgery

## 2018-10-18 ENCOUNTER — Other Ambulatory Visit: Payer: Self-pay

## 2018-10-18 DIAGNOSIS — M1711 Unilateral primary osteoarthritis, right knee: Secondary | ICD-10-CM

## 2018-10-22 ENCOUNTER — Encounter: Payer: Self-pay | Admitting: Orthopedic Surgery

## 2018-10-22 DIAGNOSIS — M1711 Unilateral primary osteoarthritis, right knee: Secondary | ICD-10-CM | POA: Diagnosis not present

## 2018-10-22 MED ORDER — LIDOCAINE HCL 1 % IJ SOLN
5.0000 mL | INTRAMUSCULAR | Status: AC | PRN
  Administered 2018-10-22: 5 mL

## 2018-10-22 MED ORDER — SODIUM HYALURONATE 60 MG/3ML IX PRSY
60.0000 mg | PREFILLED_SYRINGE | INTRA_ARTICULAR | Status: AC | PRN
  Administered 2018-10-22: 60 mg via INTRA_ARTICULAR

## 2018-10-22 NOTE — Progress Notes (Signed)
   Procedure Note  Patient: Steven Conley             Date of Birth: Sep 02, 1960           MRN: [id number]             Visit Date: 10/18/2018  Procedures: Visit Diagnoses:  1. Unilateral primary osteoarthritis, right knee     Large Joint Inj: R knee on 10/22/2018 7:11 AM Indications: diagnostic evaluation, joint swelling and pain Details: 18 G 1.5 in needle, superolateral approach  Arthrogram: No  Medications: 5 mL lidocaine 1 %; 60 mg Sodium Hyaluronate 60 MG/3ML Outcome: tolerated well, no immediate complications Procedure, treatment alternatives, risks and benefits explained, specific risks discussed. Consent was given by the patient. Immediately prior to procedure a time out was called to verify the correct patient, procedure, equipment, support staff and site/side marked as required. Patient was prepped and draped in the usual sterile fashion.

## 2019-01-19 ENCOUNTER — Ambulatory Visit: Payer: 59 | Admitting: Orthopedic Surgery

## 2019-01-30 ENCOUNTER — Other Ambulatory Visit: Payer: Self-pay | Admitting: Cardiology

## 2019-02-01 ENCOUNTER — Other Ambulatory Visit: Payer: Self-pay

## 2019-02-01 ENCOUNTER — Encounter: Payer: Self-pay | Admitting: Cardiology

## 2019-02-01 ENCOUNTER — Ambulatory Visit: Payer: 59 | Admitting: Cardiology

## 2019-02-01 VITALS — BP 120/80 | HR 54 | Ht 69.5 in | Wt 190.8 lb

## 2019-02-01 DIAGNOSIS — I480 Paroxysmal atrial fibrillation: Secondary | ICD-10-CM

## 2019-02-01 DIAGNOSIS — I10 Essential (primary) hypertension: Secondary | ICD-10-CM | POA: Diagnosis not present

## 2019-02-01 NOTE — Patient Instructions (Signed)
Medication Instructions:   If you need a refill on your cardiac medications before your next appointment, please call your pharmacy.   Lab work:  If you have labs (blood work) drawn today and your tests are completely normal, you will receive your results only by: Marland Kitchen MyChart Message (if you have MyChart) OR . A paper copy in the mail If you have any lab test that is abnormal or we need to change your treatment, we will call you to review the results.  Testing/Procedures: None ordered at this time.  Follow-Up: At [facility], you and your health needs are our priority.  As part of our continuing mission to provide you with exceptional heart care, we have created designated Provider Care Teams.  These Care Teams include your primary Cardiologist (physician) and Advanced Practice Providers (APPs -  Physician Assistants and Nurse Practitioners) who all work together to provide you with the care you need, when you need it. You will need a follow up appointment in 12 months.  Please call our office 2 months in advance to schedule this appointment.  You may see Fransico Him, MD or one of the following Advanced Practice Providers on your designated Care Team:   [place], PA-C Melina Copa, PA-C . Ermalinda Barrios, PA-C

## 2019-02-01 NOTE — Progress Notes (Signed)
Cardiology Office Note:    Date:  02/01/2019   ID:  Steven Conley, DOB Aug 29, 1960, MRN [id number]  PCP:  Lavone Orn, MD  Cardiologist:  Fransico Him, MD    Referring MD: Lavone Orn, MD   Chief Complaint  Patient presents with  . Atrial Fibrillation  . Hypertension    History of Present Illness:    Steven Conley is a 58 y.o. male with a hx of  HTN, dyslipidemia, DVT (with Factor V Leiden) and PAF.  he is here today for followup and is doing well.  He denies any chest pain or pressure, SOB, DOE, PND, orthopnea, LE edema, dizziness, palpitations or syncope. He is compliant with his meds and is tolerating meds with no SE.    Past Medical History:  Diagnosis Date  . Blood dyscrasia   . Cancer (HCC)    squamous cell  . Chronic kidney disease    acute renal failure no dialysis due to dehydration  . Colon polyp   . DVT (deep venous thrombosis) ([place])   . Dysrhythmia   . Ejection fraction    .  Marland Kitchen GERD (gastroesophageal reflux disease)   . Hemorrhoid   . Homozygous Factor V Leiden mutation ([place])    Dr. Lenna Sciara. [name]  . Hyperlipidemia   . Hypertension    not currently on BP medication  . PONV (postoperative nausea and vomiting)    no problems with Propofol use  . Sleep apnea 2015   uses CPAP intermittently    Past Surgical History:  Procedure Laterality Date  . ANTERIOR CERVICAL DECOMP/DISCECTOMY FUSION N/A 11/16/2012   Procedure: ANTERIOR CERVICAL DECOMPRESSION/DISCECTOMY FUSION 2 LEVELS;  Surgeon: Charlie Pitter, MD;  Location: [place] [facility];  Service: Neurosurgery;  Laterality: N/A;  Cervical four-five, cervical five-six anterior cervical decompression fusion with PEEK and plate   . BICEPS TENDON REPAIR     right  . BRAVO [name] STUDY  03/10/2012   Procedure: BRAVO [place];  Surgeon: Arta Silence, MD;  [facility];  Service: Endoscopy;  Laterality: N/A;  . ESOPHAGOGASTRODUODENOSCOPY (EGD) WITH PROPOFOL  03/10/2012   Procedure:  ESOPHAGOGASTRODUODENOSCOPY (EGD) WITH PROPOFOL;  Surgeon: Arta Silence, MD;  [facility];  Service: Endoscopy;  Laterality: N/A;  . EYE SURGERY Bilateral    lasik  . FRACTURE SURGERY     left ankle- bursectomy  . HEMORROIDECTOMY  1995  . KNEE ARTHROSCOPY     right  . LAPAROSCOPIC APPENDECTOMY N/A 04/25/2014   Procedure: APPENDECTOMY LAPAROSCOPIC;  Surgeon: Rolm Bookbinder, MD;  Location: [facility];  Service: General;  Laterality: N/A;  . NECK SURGERY    . NOSE SURGERY  1972, 1985  . SHOULDER SURGERY     right  . UMBILICAL HERNIA REPAIR N/A 04/25/2014   Procedure: HERNIA REPAIR UMBILICAL ADULT (PRIMARY);  Surgeon: Rolm Bookbinder, MD;  Location: [facility];  Service: General;  Laterality: N/A;  . VASECTOMY      Current Medications: Current Meds  Medication Sig  . atorvastatin (LIPITOR) 10 MG tablet Take 20 mg by mouth daily before breakfast.   . diltiazem (CARDIZEM CD) 240 MG 24 hr capsule TAKE (1) CAPSULE DAILY.  Marland Kitchen lisinopril (PRINIVIL,ZESTRIL) 10 MG tablet Take 10 mg by mouth daily.  Marland Kitchen omeprazole (PRILOSEC) 40 MG capsule Take 40 mg by mouth daily.  Marland Kitchen testosterone (ANDROGEL) 50 MG/5GM (1%) GEL Place 50 g onto the skin daily.  Marland Kitchen warfarin (COUMADIN) 5 MG tablet as directed.     Allergies:   Sulfa  drugs cross reactors   Social History   Socioeconomic History  . Marital status: Married    Spouse name: Not on file  . Number of children: Not on file  . Years of education: Not on file  . Highest education level: Not on file  Occupational History  . Not on file  Social Needs  . Financial resource strain: Not on file  . Food insecurity    Worry: Not on file    Inability: Not on file  . Transportation needs    Medical: Not on file    Non-medical: Not on file  Tobacco Use  . Smoking status: Never Smoker  . Smokeless tobacco: Never Used  Substance and Sexual Activity  . Alcohol use: Yes    Comment: socially  . Drug use: No  . Sexual activity: Yes  Lifestyle  .  Physical activity    Days per week: Not on file    Minutes per session: Not on file  . Stress: Not on file  Relationships  . Social Herbalist on phone: Not on file    Gets together: Not on file    Attends religious service: Not on file    Active member of club or organization: Not on file    Attends meetings of clubs or organizations: Not on file    Relationship status: Not on file  Other Topics Concern  . Not on file  Social History Narrative  . Not on file     Family History: The patient's family history includes Cancer in his brother, maternal aunt, mother, and sister.  ROS:   Please see the history of present illness.    ROS  All other systems reviewed and negative.   EKGs/Labs/Other Studies Reviewed:    The following studies were reviewed today: none  EKG:  EKG is  ordered today.  The ekg ordered today demonstrates sinus bradycardia at 54bpm with no sST changes  Recent Labs: No results found for requested labs within last 8760 hours.   Recent Lipid Panel No results found for: CHOL, TRIG, HDL, CHOLHDL, VLDL, LDLCALC, LDLDIRECT  Physical Exam:    VS:  BP 120/80   Pulse (!) 54   Ht 5' 9.5" (1.765 m)   Wt 190 lb 12.8 oz (86.5 kg)   BMI 27.77 kg/m     Wt Readings from Last 3 Encounters:  02/01/19 190 lb 12.8 oz (86.5 kg)  01/26/18 197 lb 9.6 oz (89.6 kg)  01/22/17 195 lb 12.8 oz (88.8 kg)     GEN:  Well nourished, well developed in no acute distress HEENT: Normal NECK: No JVD; No carotid bruits LYMPHATICS: No lymphadenopathy CARDIAC: RRR, no murmurs, rubs, gallops RESPIRATORY:  Clear to auscultation without rales, wheezing or rhonchi  ABDOMEN: Soft, non-tender, non-distended MUSCULOSKELETAL:  No edema; No deformity  SKIN: Warm and dry NEUROLOGIC:  Alert and oriented x 3 PSYCHIATRIC:  Normal affect   ASSESSMENT:    1. PAF (paroxysmal atrial fibrillation) ([place])   2. Essential hypertension    PLAN:    In order of problems listed above:   1.  PAF -he is maintaining NSR -continue Cardizem CD 240mg  daily and warfarin -he has not had any bleeding problems  2.  HTN -BP controlled on exam -continue Cardizem CD 240mg  daily and Lisinopril 10mg  daily   Medication Adjustments/Labs and Tests Ordered: Current medicines are reviewed at length with the patient today.  Concerns regarding medicines are outlined above.  Orders Placed This  Encounter  Procedures  . EKG 12-Lead   No orders of the defined types were placed in this encounter.   Signed, Fransico Him, MD  02/01/2019 3:40 PM     [facility]

## 2019-03-07 ENCOUNTER — Other Ambulatory Visit: Payer: Self-pay | Admitting: Cardiology

## 2019-04-07 ENCOUNTER — Other Ambulatory Visit: Payer: Self-pay | Admitting: Cardiology

## 2019-06-23 ENCOUNTER — Other Ambulatory Visit: Payer: Self-pay

## 2019-06-23 ENCOUNTER — Telehealth: Payer: Self-pay

## 2019-06-23 MED ORDER — DILTIAZEM HCL ER COATED BEADS 240 MG PO CP24: 240.0000 mg | ORAL_CAPSULE | Freq: Every day | ORAL | 3 refills | Status: DC

## 2019-06-23 NOTE — Addendum Note (Signed)
Addended by: Antonieta Iba on: 06/23/2019 04:10 PM   Modules accepted: Orders

## 2019-06-23 NOTE — Telephone Encounter (Signed)
Patient called back, advised him that it was okay to take generic brand. I will call pharmacy and advise them to switch.

## 2019-06-23 NOTE — Telephone Encounter (Signed)
Left message to call back  

## 2019-06-23 NOTE — Telephone Encounter (Signed)
Pt called and wanted to know if he can change his diltiazem Cardizem to generic diltiazem hydrocholoride ER. Please advise

## 2020-01-24 ENCOUNTER — Other Ambulatory Visit: Payer: Self-pay

## 2020-01-24 ENCOUNTER — Telehealth: Payer: Self-pay | Admitting: Cardiology

## 2020-01-24 ENCOUNTER — Encounter: Payer: Self-pay | Admitting: Nurse Practitioner

## 2020-01-24 ENCOUNTER — Ambulatory Visit: Payer: 59 | Admitting: Nurse Practitioner

## 2020-01-24 ENCOUNTER — Telehealth: Payer: Self-pay | Admitting: Radiology

## 2020-01-24 VITALS — BP 120/80 | HR 74 | Ht 69.0 in | Wt 192.6 lb

## 2020-01-24 DIAGNOSIS — R9431 Abnormal electrocardiogram [ECG] [EKG]: Secondary | ICD-10-CM | POA: Diagnosis not present

## 2020-01-24 DIAGNOSIS — I480 Paroxysmal atrial fibrillation: Secondary | ICD-10-CM | POA: Diagnosis not present

## 2020-01-24 DIAGNOSIS — R079 Chest pain, unspecified: Secondary | ICD-10-CM

## 2020-01-24 DIAGNOSIS — I10 Essential (primary) hypertension: Secondary | ICD-10-CM | POA: Diagnosis not present

## 2020-01-24 MED ORDER — DILTIAZEM HCL 30 MG PO TABS: 30.0000 mg | ORAL_TABLET | Freq: Four times a day (QID) | ORAL | 3 refills | Status: DC | PRN

## 2020-01-24 NOTE — Patient Instructions (Addendum)
After Visit Summary:  We will be checking the following labs today - BMET, CBC and TSH   Medication Instructions:    Continue with your current medicines. BUT  I am sending in short acting Diltiazem 30 mg to take for rapid heart beating - can use this up to 4 times a day if needed.    If you need a refill on your cardiac medications before your next appointment, please call your pharmacy.     Testing/Procedures To Be Arranged:  Echocardiogram  Lexiscan   14 day Zio  You are scheduled for a Myocardial Perfusion Imaging Study on ____________________________ at __________________________________.   Please arrive 15 minutes prior to your appointment time for registration and insurance purposes.   The test will take approximately 3 to 4 hours to complete; you may bring reading material. If someone comes with you to your appointment, they will need to remain in the main lobby due to limited space in the testing area.   How to prepare for your Myocardial Perfusion test:   Do not eat or drink 3 hours prior to your test, except you may have water.    Do not consume products containing caffeine (regular or decaffeinated) 12 hours prior to your test (ex: coffee, chocolate, soda, tea)   Do bring a list of your current medications with you. If not listed below, you may take your medications as normal.    Bring any held medication to your appointment, as you may be required to take it once the test is complete.   Do wear comfortable clothes (no dresses or overalls) and walking shoes. Tennis shoes are preferred. No heels or open toed shoes.  Do not wear cologne, perfume, aftershave or lotions (deodorant is allowed).   If these instructions are not followed, you test will have to be rescheduled.   Please report to [address] for your test. If you have questions or concerns about your appointment, please call the Nuclear Lab at [phone].  If you cannot  keep your appointment, please provide 24 hour notification to the Nuclear lab to avoid a possible $50 charge to your account.       Follow-Up:   See Dr. Radford Pax after your studies are complete.     At [facility], you and your health needs are our priority.  As part of our continuing mission to provide you with exceptional heart care, we have created designated Provider Care Teams.  These Care Teams include your primary Cardiologist (physician) and Advanced Practice Providers (APPs -  Physician Assistants and Nurse Practitioners) who all work together to provide you with the care you need, when you need it.  Special Instructions:  . Stay safe, wash your hands for at least 20 seconds and wear a mask when needed.  . It was good to talk with you today.  ZIO XT- Long Term Monitor Instructions   Your physician has requested you wear your ZIO patch monitor___14____days.   This is a single patch monitor.  [employer] supplies one patch monitor per enrollment.  Additional stickers are not available.   Please do not apply patch if you will be having a Nuclear Stress Test, Echocardiogram, Cardiac CT, MRI, or Chest Xray during the time frame you would be wearing the monitor. The patch cannot be worn during these tests.  You cannot remove and re-apply the ZIO XT patch monitor.   Your ZIO patch monitor will be sent USPS Priority mail from [facility]  directly to your home address. The monitor may also be mailed to a PO BOX if home delivery is not available.   It may take 3-5 days to receive your monitor after you have been enrolled.   Once you have received you monitor, please review enclosed instructions.  Your monitor has already been registered assigning a specific monitor serial # to you.   Applying the monitor   Shave hair from upper left chest.   Hold abrader disc by orange tab.  Rub abrader in 40 strokes over left upper chest as indicated in your monitor instructions.   Clean area  with 4 enclosed alcohol pads .  Use all pads to assure are is cleaned thoroughly.  Let dry.   Apply patch as indicated in monitor instructions.  Patch will be place under collarbone on left side of chest with arrow pointing upward.   Rub patch adhesive wings for 2 minutes.Remove white label marked "1".  Remove white label marked "2".  Rub patch adhesive wings for 2 additional minutes.   While looking in a mirror, press and release button in center of patch.  A small green light will flash 3-4 times .  This will be your only indicator the monitor has been turned on.     Do not shower for the first 24 hours.  You may shower after the first 24 hours.   Press button if you feel a symptom. You will hear a small click.  Record Date, Time and Symptom in the Patient Log Book.   When you are ready to remove patch, follow instructions on last 2 pages of Patient Log Book.  Stick patch monitor onto last page of Patient Log Book.   Place Patient Log Book in [place] box.  Use locking tab on box and tape box closed securely.  The [employer] has [employer] on it.  Please place in mailbox as soon as possible.  Your physician should have your test results approximately 7 days after the monitor has been mailed back to [facility].   Call [facility] at [phone] if you have questions regarding your ZIO XT patch monitor.  Call them immediately if you see an orange light blinking on your monitor.   If your monitor falls off in less than 4 days contact our Monitor department at [phone].  If your monitor becomes loose or falls off after 4 days call [employer] at [phone] for suggestions on securing your monitor.    Call the [place] office at [phone] if you have any questions, problems or concerns.

## 2020-01-24 NOTE — Telephone Encounter (Signed)
Notified patient regarding appointment with Truitt Merle, NP today at 2:45. He verbalized understanding and agreement to arrive at 2:30 and thanked me for the call.

## 2020-01-24 NOTE — Telephone Encounter (Signed)
New Message:     Pt would like to be seen by somebody today.  Patient c/o Palpitations:  High priority if patient c/o lightheadedness, shortness of breath, or chest pain  1) How long have you had palpitations/irregular HR/ Afib? Are you having the symptoms now? Afb- he says he feel better than yesterday 2)  3)  currently experiencing lightheadedness, SOB or CP? Lightheaded, dizzy-it made him feel nausated and vomiting yesterday and last night  4) Do you have a history of afib (atrial fibrillation) or irregular heart rhythm? yes  5) Have you checked your BP or HR? (document readings if available): 131/95 pulse rate 120 last night, this morning 116/66 pulse 98 this morning  6) Are you experiencing any other symptoms? Palpitations better this morning still in  Afib

## 2020-01-24 NOTE — Telephone Encounter (Signed)
Returned call to patient who states he is having more a fib recently. Woke up Monday morning with chest pounding and irregular HR, felt dizzy, unsteady on feet. Also had nausea and vomiting yesterday - he did take medication but does not know if it stayed down. Thinks he may be having some vertigo or had severe dizziness from a fib. Feels better today. Denies dizziness. Has taken his medications and no n/v. No diarrhea yesterday or today.  Pulse yesterday 125 bpm, today it is 98 bpm. He requests an appointment today if possible. I advised that I will review schedules and call him back soon. He verbalized understanding and agreement and thanked me for the call.
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
## 2020-01-24 NOTE — Telephone Encounter (Signed)
Patient was enrolled for a 14 day Zio XT monitor from in office supply [id number]. Patient knows to apply monitor after echo appt on 10/11

## 2020-01-25 ENCOUNTER — Telehealth (HOSPITAL_COMMUNITY): Payer: Self-pay | Admitting: *Deleted

## 2020-01-25 LAB — BASIC METABOLIC PANEL
BUN/Creatinine Ratio: 12 (ref 10–24)
BUN: 16 mg/dL (ref 8–27)
CO2: 24 mmol/L (ref 20–29)
Calcium: 8.9 mg/dL (ref 8.6–10.2)
Chloride: 100 mmol/L (ref 96–106)
Creatinine, Ser: 1.31 mg/dL — ABNORMAL HIGH (ref 0.76–1.27)
GFR calc Af Amer: 67 mL/min/{1.73_m2} (ref >59)
GFR calc non Af Amer: 58 mL/min/{1.73_m2} — ABNORMAL LOW (ref >59)
Glucose: 95 mg/dL (ref 65–99)
Potassium: 3.6 mmol/L (ref 3.5–5.2)
Sodium: 140 mmol/L (ref 134–144)

## 2020-01-25 LAB — CBC
Hematocrit: 50.6 % (ref 37.5–51.0)
Hemoglobin: 17.8 g/dL — ABNORMAL HIGH (ref 13.0–17.7)
MCH: 32.4 pg (ref 26.6–33.0)
MCHC: 35.2 g/dL (ref 31.5–35.7)
MCV: 92 fL (ref 79–97)
Platelets: 182 10*3/uL (ref 150–450)
RBC: 5.5 x10E6/uL (ref 4.14–5.80)
RDW: 14.4 % (ref 11.6–15.4)
WBC: 11.3 10*3/uL — ABNORMAL HIGH (ref 3.4–10.8)

## 2020-01-25 LAB — TSH: TSH: 1.4 u[IU]/mL (ref 0.450–4.500)

## 2020-01-25 NOTE — Telephone Encounter (Signed)
Patient given detailed instructions per Myocardial Perfusion Study Information Sheet for the test on 01/27/2020 at 0715. Patient notified to arrive 15 minutes early and that it is imperative to arrive on time for appointment to keep from having the test rescheduled.  If you need to cancel or reschedule your appointment, please call the office within 24 hours of your appointment. . Patient verbalized understanding.Mohamed Portlock, [place] Patient does not use mychart

## 2020-01-27 ENCOUNTER — Ambulatory Visit (HOSPITAL_COMMUNITY): Payer: 59 | Attending: Cardiology

## 2020-01-27 ENCOUNTER — Other Ambulatory Visit: Payer: Self-pay

## 2020-01-27 DIAGNOSIS — R079 Chest pain, unspecified: Secondary | ICD-10-CM

## 2020-01-27 DIAGNOSIS — I10 Essential (primary) hypertension: Secondary | ICD-10-CM | POA: Diagnosis present

## 2020-01-27 DIAGNOSIS — R9431 Abnormal electrocardiogram [ECG] [EKG]: Secondary | ICD-10-CM | POA: Diagnosis present

## 2020-01-27 DIAGNOSIS — I480 Paroxysmal atrial fibrillation: Secondary | ICD-10-CM | POA: Diagnosis present

## 2020-01-27 LAB — MYOCARDIAL PERFUSION IMAGING
LV dias vol: 102 mL (ref 62–150)
LV sys vol: 40 mL
Peak HR: 93 {beats}/min
Rest HR: 65 {beats}/min
SDS: 0
SRS: 0
SSS: 0
TID: 1.01

## 2020-01-27 MED ORDER — TECHNETIUM TC 99M TETROFOSMIN IV KIT
10.5000 | PACK | Freq: Once | INTRAVENOUS | Status: AC | PRN
  Administered 2020-01-27: 10.5 via INTRAVENOUS
  Filled 2020-01-27: qty 11

## 2020-01-27 MED ORDER — TECHNETIUM TC 99M TETROFOSMIN IV KIT
32.0000 | PACK | Freq: Once | INTRAVENOUS | Status: AC | PRN
  Administered 2020-01-27: 32 via INTRAVENOUS
  Filled 2020-01-27: qty 32

## 2020-01-27 MED ORDER — REGADENOSON 0.4 MG/5ML IV SOLN
0.4000 mg | Freq: Once | INTRAVENOUS | Status: AC
  Administered 2020-01-27: 0.4 mg via INTRAVENOUS

## 2020-02-06 ENCOUNTER — Ambulatory Visit (HOSPITAL_COMMUNITY): Payer: 59 | Attending: Cardiology

## 2020-02-06 ENCOUNTER — Other Ambulatory Visit: Payer: Self-pay

## 2020-02-06 ENCOUNTER — Telehealth: Payer: Self-pay | Admitting: Nurse Practitioner

## 2020-02-06 DIAGNOSIS — I480 Paroxysmal atrial fibrillation: Secondary | ICD-10-CM

## 2020-02-06 DIAGNOSIS — I10 Essential (primary) hypertension: Secondary | ICD-10-CM | POA: Diagnosis present

## 2020-02-06 DIAGNOSIS — R079 Chest pain, unspecified: Secondary | ICD-10-CM | POA: Diagnosis present

## 2020-02-06 DIAGNOSIS — R9431 Abnormal electrocardiogram [ECG] [EKG]: Secondary | ICD-10-CM

## 2020-02-06 LAB — ECHOCARDIOGRAM COMPLETE
Area-P 1/2: 2.54 cm2
S' Lateral: 2.6 cm

## 2020-02-06 NOTE — Telephone Encounter (Signed)
Burtis Junes, NP  P Cv Div Ch St Triage Ok to report. His echo looks good - normal pump function - valves all ok.  Copy to PCP.    The patient has been notified of the result and verbalized understanding.  All questions (if any) were answered. Nuala Alpha, LPN 93/02/2161 44:69 AM

## 2020-02-06 NOTE — Telephone Encounter (Signed)
Follow Up:      Pt is returning Ivy's call, concerning his results.

## 2020-02-07 ENCOUNTER — Other Ambulatory Visit (INDEPENDENT_AMBULATORY_CARE_PROVIDER_SITE_OTHER): Payer: 59

## 2020-02-07 DIAGNOSIS — R9431 Abnormal electrocardiogram [ECG] [EKG]: Secondary | ICD-10-CM

## 2020-02-07 DIAGNOSIS — I10 Essential (primary) hypertension: Secondary | ICD-10-CM | POA: Diagnosis not present

## 2020-02-07 DIAGNOSIS — R079 Chest pain, unspecified: Secondary | ICD-10-CM

## 2020-02-07 DIAGNOSIS — I480 Paroxysmal atrial fibrillation: Secondary | ICD-10-CM

## 2020-03-09 ENCOUNTER — Ambulatory Visit: Payer: 59 | Admitting: Cardiology

## 2020-03-09 ENCOUNTER — Encounter: Payer: Self-pay | Admitting: Cardiology

## 2020-03-09 ENCOUNTER — Other Ambulatory Visit: Payer: Self-pay

## 2020-03-09 VITALS — BP 120/62 | HR 74 | Ht 69.0 in | Wt 201.2 lb

## 2020-03-09 DIAGNOSIS — I480 Paroxysmal atrial fibrillation: Secondary | ICD-10-CM | POA: Diagnosis not present

## 2020-03-09 DIAGNOSIS — R0609 Other forms of dyspnea: Secondary | ICD-10-CM

## 2020-03-09 DIAGNOSIS — I10 Essential (primary) hypertension: Secondary | ICD-10-CM

## 2020-03-09 DIAGNOSIS — R0600 Dyspnea, unspecified: Secondary | ICD-10-CM | POA: Diagnosis not present

## 2020-03-09 DIAGNOSIS — I471 Supraventricular tachycardia: Secondary | ICD-10-CM | POA: Diagnosis not present

## 2020-03-09 DIAGNOSIS — I4719 Other supraventricular tachycardia: Secondary | ICD-10-CM | POA: Insufficient documentation

## 2020-03-09 NOTE — Patient Instructions (Signed)

## 2020-03-09 NOTE — Progress Notes (Signed)
Cardiology Office Note:    Date:  03/09/2020   ID:  Nonda Lou, DOB 1960-06-03, MRN [id number]  PCP:  Lavone Orn, MD  Cardiologist:  Fransico Him, MD    Referring MD: Lavone Orn, MD   Chief Complaint  Patient presents with  . Atrial Fibrillation  . Hypertension    History of Present Illness:    Steven Conley is a 59 y.o. male with a hx of  HTN, dyslipidemia, DVT (with Factor V Leiden) and PAF. He saw my NP, Truitt Merle, in Sept for an episode of palpitations and was given short acting CCB.  This only occurred for 1-2 days and then resolved and has not had it back again.    He is here today for followup and is doing well. He has gained 15lbs and now notices some DOE when climbing 5 flights of stairs.  He also has not been as active due to DJD in his knee which has limited his exercise and likely now is deconditioned.  He denies any chest pain or pressure, PND, orthopnea, LE edema, dizziness (except for vertigo) or syncope. He is compliant with his meds and is tolerating meds with no SE.    Past Medical History:  Diagnosis Date  . Blood dyscrasia   . Cancer (HCC)    squamous cell  . Chronic kidney disease    acute renal failure no dialysis due to dehydration  . Colon polyp   . DVT (deep venous thrombosis) ([place])   . Dysrhythmia   . Ejection fraction    .  Marland Kitchen GERD (gastroesophageal reflux disease)   . Hemorrhoid   . Homozygous Factor V Leiden mutation ([place])    Dr. Lenna Sciara. [name]  . Hyperlipidemia   . Hypertension    not currently on BP medication  . PAT (paroxysmal atrial tachycardia) ([place])   . PONV (postoperative nausea and vomiting)    no problems with Propofol use  . Sleep apnea 2015   uses CPAP intermittently    Past Surgical History:  Procedure Laterality Date  . ANTERIOR CERVICAL DECOMP/DISCECTOMY FUSION N/A 11/16/2012   Procedure: ANTERIOR CERVICAL DECOMPRESSION/DISCECTOMY FUSION 2 LEVELS;  Surgeon: Charlie Pitter, MD;  Location: [name]  NEURO ORS;  Service: Neurosurgery;  Laterality: N/A;  Cervical four-five, cervical five-six anterior cervical decompression fusion with PEEK and plate   . BICEPS TENDON REPAIR     right  . BRAVO [place] STUDY  03/10/2012   Procedure: BRAVO [place];  Surgeon: Arta Silence, MD;  Location: [facility];  Service: Endoscopy;  Laterality: N/A;  . ESOPHAGOGASTRODUODENOSCOPY (EGD) WITH PROPOFOL  03/10/2012   Procedure: ESOPHAGOGASTRODUODENOSCOPY (EGD) WITH PROPOFOL;  Surgeon: Arta Silence, MD;  [facility];  Service: Endoscopy;  Laterality: N/A;  . EYE SURGERY Bilateral    lasik  . FRACTURE SURGERY     left ankle- bursectomy  . HEMORROIDECTOMY  1995  . KNEE ARTHROSCOPY     right  . LAPAROSCOPIC APPENDECTOMY N/A 04/25/2014   Procedure: APPENDECTOMY LAPAROSCOPIC;  Surgeon: Rolm Bookbinder, MD;  Location: [facility];  Service: General;  Laterality: N/A;  . NECK SURGERY    . NOSE SURGERY  1972, 1985  . SHOULDER SURGERY     right  . UMBILICAL HERNIA REPAIR N/A 04/25/2014   Procedure: HERNIA REPAIR UMBILICAL ADULT (PRIMARY);  Surgeon: Rolm Bookbinder, MD;  Location: [facility];  Service: General;  Laterality: N/A;  . VASECTOMY      Current Medications: Current Meds  Medication Sig  . atorvastatin (  LIPITOR) 20 MG tablet Take 20 mg by mouth daily.  Marland Kitchen diltiazem (CARDIZEM CD) 240 MG 24 hr capsule Take 1 capsule (240 mg total) by mouth daily.  Marland Kitchen diltiazem (CARDIZEM) 30 MG tablet Take 1 tablet (30 mg total) by mouth 4 (four) times daily as needed (fast heart beating).  . fluticasone (CUTIVATE) 0.05 % cream Apply topically.  Marland Kitchen lisinopril (PRINIVIL,ZESTRIL) 10 MG tablet Take 10 mg by mouth daily.  Marland Kitchen omeprazole (PRILOSEC) 40 MG capsule Take 40 mg by mouth daily.  Marland Kitchen testosterone (ANDROGEL) 50 MG/5GM (1%) GEL Place 50 g onto the skin daily.  . valACYclovir (VALTREX) 1000 MG tablet Take 1,000 mg by mouth daily.  Marland Kitchen venlafaxine XR (EFFEXOR-XR) 37.5 MG 24 hr capsule   . warfarin (COUMADIN) 5 MG tablet  Take 5 mg by mouth daily. Per patient taking 5 mg on Monday's and taking 2.5 mg Tues.,Wed., Thurs., Fri., Sat., and Sun.     Allergies:   Sulfa drugs cross reactors   Social History   Socioeconomic History  . Marital status: Married    Spouse name: Not on file  . Number of children: Not on file  . Years of education: Not on file  . Highest education level: Not on file  Occupational History  . Not on file  Tobacco Use  . Smoking status: Never Smoker  . Smokeless tobacco: Never Used  Vaping Use  . Vaping Use: Never used  Substance and Sexual Activity  . Alcohol use: Yes    Comment: socially  . Drug use: No  . Sexual activity: Yes  Other Topics Concern  . Not on file  Social History Narrative  . Not on file   Social Determinants of Health   Financial Resource Strain:   . Difficulty of Paying Living Expenses: Not on file  Food Insecurity:   . Worried About Charity fundraiser in the Last Year: Not on file  . Ran Out of Food in the Last Year: Not on file  Transportation Needs:   . Lack of Transportation (Medical): Not on file  . Lack of Transportation (Non-Medical): Not on file  Physical Activity:   . Days of Exercise per Week: Not on file  . Minutes of Exercise per Session: Not on file  Stress:   . Feeling of Stress : Not on file  Social Connections:   . Frequency of Communication with Friends and Family: Not on file  . Frequency of Social Gatherings with Friends and Family: Not on file  . Attends Religious Services: Not on file  . Active Member of Clubs or Organizations: Not on file  . Attends Archivist Meetings: Not on file  . Marital Status: Not on file     Family History: The patient's family history includes Cancer in his brother, maternal aunt, mother, and sister.  ROS:   Please see the history of present illness.    ROS  All other systems reviewed and negative.   EKGs/Labs/Other Studies Reviewed:    The following studies were reviewed  today: none  EKG:  EKG is not  ordered today.   Recent Labs: 01/24/2020: BUN 16; Creatinine, Ser 1.31; Hemoglobin 17.8; Platelets 182; Potassium 3.6; Sodium 140; TSH 1.400   Recent Lipid Panel No results found for: CHOL, TRIG, HDL, CHOLHDL, VLDL, LDLCALC, LDLDIRECT  Physical Exam:    VS:  BP 120/62   Pulse 74   Ht 5\' 9"  (1.753 m)   Wt 201 lb 3.2 oz (91.3 kg)  SpO2 95%   BMI 29.71 kg/m     Wt Readings from Last 3 Encounters:  03/09/20 201 lb 3.2 oz (91.3 kg)  01/27/20 192 lb (87.1 kg)  01/24/20 192 lb 9.6 oz (87.4 kg)     GEN: Well nourished, well developed in no acute distress HEENT: Normal NECK: No JVD; No carotid bruits LYMPHATICS: No lymphadenopathy CARDIAC:RRR, no murmurs, rubs, gallops RESPIRATORY:  Clear to auscultation without rales, wheezing or rhonchi  ABDOMEN: Soft, non-tender, non-distended MUSCULOSKELETAL:  No edema; No deformity  SKIN: Warm and dry NEUROLOGIC:  Alert and oriented x 3 PSYCHIATRIC:  Normal affect    ASSESSMENT:    1. PAF (paroxysmal atrial fibrillation) ([place])   2. Primary hypertension   3. PAT (paroxysmal atrial tachycardia) (HCC)    PLAN:    In order of problems listed above:  1.  PAF/PAT -he continues to maintain NSR  -He did have some problems with palpitations back in Sept for 1-2 days and seen in office and given short acting CCB to take PRN but has not had to take it -he has not had any further palpitations -continue Cardizem CD 240mg  daily and warfarin -I will have him continue on short acting CCB as needed and call if palpitations last more than 24 hours -he has not had any bleeding problems on warfarin -encouraged him to cut back on caffeine and ETOH some  2.  HTN -BP is well controlled on exam today -continue Cardizem CD 240mg  daily and Lisinopril 10mg  daily -SCr was 1.31 in Sept 2021   3.  DOE -he has gained 15lbs and now notices some DOE when climbing 5 flights of stairs -suspect that this is related to weight  gain.  He also has not been as active due to DJD in his knee which has limited his exercise and likely now is deconditioned -recent 2D echo showed normal LVF and nuclear stress test was normal in Oct 2021   Medication Adjustments/Labs and Tests Ordered: Current medicines are reviewed at length with the patient today.  Concerns regarding medicines are outlined above.  No orders of the defined types were placed in this encounter.  No orders of the defined types were placed in this encounter.   Signed, Fransico Him, MD  03/09/2020 9:46 AM    [place]

## 2020-06-20 ENCOUNTER — Other Ambulatory Visit: Payer: Self-pay | Admitting: Cardiology

## 2020-10-03 ENCOUNTER — Telehealth: Payer: Self-pay | Admitting: Cardiology

## 2020-10-03 NOTE — Telephone Encounter (Signed)
Pt c/o medication issue:  1. Name of Medication: diltiazem (CARDIZEM) 30 MG tablet diltiazem (CARDIZEM CD) 240 MG 24 hr capsule  2. How are you currently taking this medication (dosage and times per day)? As prescribed   3. Are you having a reaction (difficulty breathing--STAT)?   4. What is your medication issue? Pt is requesting a alternative for this medication.He states that he can not afford.PT states that his insurance is not approving the medication and needs a replacement because the copay is too high

## 2020-10-03 NOTE — Telephone Encounter (Signed)
**Note De-Identified  Obfuscation** I called [facility] and was advised that the pt picked up his Diltiazem 240 mg on 6/3 and paid $24/90 day supply and that they have not re-filled his Diltiazem 30 mg since 2021 (sounds correct as he only takes as needed) and that his co-pay for a 90 day supply for this mg is $8.  I called the pt and he states that he was charged $24 for a 30 day supply of Diltiazem 240 mg on 6/3 not a 90 day supply. He checked his bottle and verified that he only received #30 pills.  I advised him that per [facility] a PA is not required and that Diltiazem is a generic medication  Also, I advised him to call [facility] to ask why he was charged for a 90 day supply but only received a 30 day.  He states that he will and will call us back if he needs further assistance with his Diltiazem refills.

## 2021-01-09 ENCOUNTER — Ambulatory Visit: Payer: 59 | Admitting: Orthopedic Surgery

## 2021-01-09 ENCOUNTER — Ambulatory Visit: Payer: Self-pay

## 2021-01-09 ENCOUNTER — Ambulatory Visit (INDEPENDENT_AMBULATORY_CARE_PROVIDER_SITE_OTHER): Payer: 59

## 2021-01-09 ENCOUNTER — Other Ambulatory Visit: Payer: Self-pay

## 2021-01-09 VITALS — Ht 69.5 in | Wt 190.0 lb

## 2021-01-09 DIAGNOSIS — M25512 Pain in left shoulder: Secondary | ICD-10-CM

## 2021-01-09 DIAGNOSIS — M25561 Pain in right knee: Secondary | ICD-10-CM | POA: Diagnosis not present

## 2021-01-11 ENCOUNTER — Encounter: Payer: Self-pay | Admitting: Orthopedic Surgery

## 2021-01-11 NOTE — Progress Notes (Signed)
Office Visit Note   Patient: Steven Conley           Date of Birth: Jun 02, 1960           MRN: [id number] Visit Date: 01/09/2021 Requested by: Lavone Orn, MD [address] [place],  [place] [address] PCP: Lavone Orn, MD  Subjective: Chief Complaint  Patient presents with   Left Shoulder - Pain   Right Knee - Pain    HPI: Steven Conley is a 60 year old patient with right knee pain.  Reports constant pain which wakes him from sleep at night.  He is having difficulty with stairs and incline walking.  Last right knee injection was 10/18/2018.  He states that "shots do not work anymore".  He does have factor V Leiden and has had a history of DVT but no pulmonary embolism.  The DVT was in the right leg.  He is on Coumadin with INR between 2 and 3.  He has had a history of knee arthroscopy x2.  Patient also reports left shoulder pain on and off for 20 years.  He had a fall about 1 week ago which made his shoulder pain slightly worse.  Denies any weakness or mechanical symptoms in the shoulder.  Localizes the pain to the deltoid region              ROS: All systems reviewed are negative as they relate to the chief complaint within the history of present illness.  Patient denies  fevers or chills.   Assessment & Plan: Visit Diagnoses:  1. Left shoulder pain, unspecified chronicity   2. Right knee pain, unspecified chronicity     Plan: Impression is right knee pain with end-stage arthritis affecting the medial compartment and radiographically the patellofemoral compartment.  Left shoulder looks like aggravation of bursitis.  He wants to wait on any intervention for the left shoulder.  Regarding the right knee we had a very long discussion today about operative and nonoperative treatment options.  In general I would favor press-fit total knee replacement in his case.  With his factor V Leiden I want to minimize his potential surgical procedures.  I also think that he has a  component of patellofemoral arthritis in addition to his medial compartment arthritis.  The risk and benefits are discussed including not limited to infection nerve vessel damage knee stiffness as well as incomplete pain relief.  In Linda's case we also have to consider his history of DVT.  Plan for that would be either Xarelto or Lovenox bridge.  Plan to consult his primary care physician Dr. Laurann Montana about optimal strategies in the perioperative period for DVT prophylaxis.  Of note is that [place] prefers not to have Lovenox injections.  He would also like a date in December, specifically December 16 because that correlates well with his partner and other rehabilitation goals.  Plan for home health for 7 days then start outpatient therapy the second week after surgery.  He does have 25 stairs at home.  This will be a formidable task for him to face when he does give him after staying in his girlfriend's place which has no stairs.  All questions answered.  Follow-Up Instructions: No follow-ups on file.   Orders:  Orders Placed This Encounter  Procedures   XR Knee 1-2 Views Right   XR Shoulder Left   No orders of the defined types were placed in this encounter.     Procedures: No procedures performed   Clinical  Data: No additional findings.  Objective: Vital Signs: Ht 5' 9.5" (1.765 m)   Wt 190 lb (86.2 kg)   BMI 27.66 kg/m   Physical Exam:   Constitutional: Patient appears well-developed HEENT:  Head: Normocephalic Eyes:EOM are normal Neck: Normal range of motion Cardiovascular: Normal rate Pulmonary/chest: Effort normal Neurologic: Patient is alert Skin: Skin is warm Psychiatric: Patient has normal mood and affect   Ortho Exam: Ortho exam demonstrates 2 to 3 degree flexion contracture in the right knee compared to the left.  Trace effusion is present.  Medial greater than lateral joint line tenderness is present.  Patellofemoral crepitus is present bilaterally.  No groin  pain with internal or external rotation of the legs.  Veins are slightly more prominent on the right leg and left leg.  Calf is about a centimeter more in circumference on the right than the left but there is no calf tenderness and negative Homans today on the right-hand side.  No groin pain with internal and external rotation of that right leg.  Specialty Comments:  No specialty comments available.  Imaging: No results found.   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 ([place]) 05/16/2014   Ejection fraction    PAF (paroxysmal atrial fibrillation) ([place]) 01/09/2013   Warfarin anticoagulation 01/09/2013   Spondylosis, cervical, with myelopathy 11/16/2012   Factor V Leiden mutation ([place]) 09/07/2012   ARF (acute renal failure) ([place]) 09/07/2012   SOB (shortness of breath) 09/07/2012   Chest discomfort 09/07/2012   HTN (hypertension) 09/07/2012   GERD (gastroesophageal reflux disease) 09/07/2012   HLD (hyperlipidemia) 09/07/2012   History of DVT of lower extremity 09/07/2012   Internal hemorrhoids with complication [id number]   Past Medical History:  Diagnosis Date   Blood dyscrasia    Cancer ([place])    squamous cell   Chronic kidney disease    acute renal failure no dialysis due to dehydration   Colon polyp    DVT (deep venous thrombosis) (HCC)    Dysrhythmia    Ejection fraction    .   GERD (gastroesophageal reflux disease)    Hemorrhoid    Homozygous Factor V Leiden mutation ([place])    Dr. Gretchen Short   Hyperlipidemia    Hypertension    not currently on BP medication   PAT (paroxysmal atrial tachycardia) (HCC)    PONV (postoperative nausea and vomiting)    no problems with Propofol use   Sleep apnea 2015   uses CPAP intermittently    Family History  Problem Relation Age of Onset   Cancer Mother         breast   Cancer Sister        breast   Cancer Brother        stomach   Cancer Maternal Aunt        breast    Past Surgical History:  Procedure Laterality Date   ANTERIOR CERVICAL DECOMP/DISCECTOMY FUSION N/A 11/16/2012   Procedure: ANTERIOR CERVICAL DECOMPRESSION/DISCECTOMY FUSION 2 LEVELS;  Surgeon: Charlie Pitter, MD;  Location: [facility] NEURO ORS;  Service: Neurosurgery;  Laterality: N/A;  Cervical four-five, cervical five-six anterior cervical decompression fusion with PEEK and plate    BICEPS TENDON REPAIR     right   BRAVO [place] STUDY  03/10/2012   Procedure: BRAVO [place];  Surgeon: Arta Silence, MD;  [facility];  Service: Endoscopy;  Laterality: N/A;   ESOPHAGOGASTRODUODENOSCOPY (EGD) WITH PROPOFOL  03/10/2012   Procedure: ESOPHAGOGASTRODUODENOSCOPY (EGD) WITH PROPOFOL;  Surgeon: Arta Silence, MD;  [facility];  Service: Endoscopy;  Laterality: N/A;   EYE SURGERY Bilateral    lasik   FRACTURE SURGERY     left ankle- bursectomy   HEMORROIDECTOMY  1995   KNEE ARTHROSCOPY     right   LAPAROSCOPIC APPENDECTOMY N/A 04/25/2014   Procedure: APPENDECTOMY LAPAROSCOPIC;  Surgeon: Rolm Bookbinder, MD;  Location: [facility];  Service: General;  Laterality: N/A;   [place]     right   UMBILICAL HERNIA REPAIR N/A 04/25/2014   Procedure: HERNIA REPAIR UMBILICAL ADULT (PRIMARY);  Surgeon: Rolm Bookbinder, MD;  [facility];  Service: General;  Laterality: N/A;   VASECTOMY     Social History   Occupational History   Not on file  Tobacco Use   Smoking status: Never   Smokeless tobacco: Never  Vaping Use   Vaping Use: Never used  Substance and Sexual Activity   Alcohol use: Yes    Comment: socially   Drug use: No   Sexual activity: Yes

## 2021-01-28 ENCOUNTER — Other Ambulatory Visit: Payer: Self-pay

## 2021-02-14 ENCOUNTER — Other Ambulatory Visit: Payer: Self-pay

## 2021-02-14 ENCOUNTER — Encounter (INDEPENDENT_AMBULATORY_CARE_PROVIDER_SITE_OTHER): Payer: 59 | Admitting: Ophthalmology

## 2021-02-14 DIAGNOSIS — H35411 Lattice degeneration of retina, right eye: Secondary | ICD-10-CM | POA: Diagnosis not present

## 2021-02-14 DIAGNOSIS — H33301 Unspecified retinal break, right eye: Secondary | ICD-10-CM | POA: Diagnosis not present

## 2021-02-14 DIAGNOSIS — H2513 Age-related nuclear cataract, bilateral: Secondary | ICD-10-CM

## 2021-02-14 DIAGNOSIS — H35033 Hypertensive retinopathy, bilateral: Secondary | ICD-10-CM

## 2021-02-14 DIAGNOSIS — I10 Essential (primary) hypertension: Secondary | ICD-10-CM

## 2021-02-14 DIAGNOSIS — H43813 Vitreous degeneration, bilateral: Secondary | ICD-10-CM

## 2021-02-20 ENCOUNTER — Other Ambulatory Visit: Payer: Self-pay

## 2021-02-20 ENCOUNTER — Encounter (INDEPENDENT_AMBULATORY_CARE_PROVIDER_SITE_OTHER): Payer: 59 | Admitting: Ophthalmology

## 2021-02-20 DIAGNOSIS — H33301 Unspecified retinal break, right eye: Secondary | ICD-10-CM

## 2021-04-01 ENCOUNTER — Ambulatory Visit: Payer: 59 | Admitting: Cardiology

## 2021-04-04 ENCOUNTER — Encounter (HOSPITAL_COMMUNITY): Payer: 59

## 2021-04-12 ENCOUNTER — Ambulatory Visit: Admit: 2021-04-12 | Payer: 59 | Admitting: Orthopedic Surgery

## 2021-04-12 SURGERY — ARTHROPLASTY, KNEE, TOTAL
Anesthesia: General | Site: Knee | Laterality: Right

## 2021-04-18 ENCOUNTER — Encounter: Payer: 59 | Admitting: Orthopedic Surgery

## 2021-04-19 ENCOUNTER — Encounter: Payer: 59 | Admitting: Orthopedic Surgery

## 2021-05-19 ENCOUNTER — Other Ambulatory Visit: Payer: Self-pay | Admitting: Cardiology

## 2021-06-26 ENCOUNTER — Encounter (INDEPENDENT_AMBULATORY_CARE_PROVIDER_SITE_OTHER): Payer: 59 | Admitting: Ophthalmology

## 2021-06-26 ENCOUNTER — Other Ambulatory Visit: Payer: Self-pay

## 2021-06-26 DIAGNOSIS — H35033 Hypertensive retinopathy, bilateral: Secondary | ICD-10-CM | POA: Diagnosis not present

## 2021-06-26 DIAGNOSIS — H43813 Vitreous degeneration, bilateral: Secondary | ICD-10-CM

## 2021-06-26 DIAGNOSIS — H33301 Unspecified retinal break, right eye: Secondary | ICD-10-CM

## 2021-06-26 DIAGNOSIS — I10 Essential (primary) hypertension: Secondary | ICD-10-CM

## 2021-07-01 NOTE — Progress Notes (Signed)
?Cardiology Office Note:   ? ?Date:  07/02/2021  ? ?ID:  Steven Conley, DOB 12-23-1960, MRN [id number] ? ?PCP:  Lavone Orn, MD  ?Cardiologist:  Fransico Him, MD   ? ?Referring MD: Lavone Orn, MD  ? ?Chief Complaint  ?Patient presents with  ? Atrial Fibrillation  ? Hypertension  ? ? ?History of Present Illness:   ? ?Steven Conley is a 61 y.o. male with a hx of  HTN, PAF/PAT,dyslipidemia, DVT (with Factor V Leiden) and PAF. He is here today for followup and is doing well.  He denies any chest pain or pressure, SOB, DOE, PND, orthopnea, LE edema, dizziness or syncope. He has had a few episodes of PAF. The one episode lasted all day and he took a short acting diltiazem and it resolved.  He also notices occasional skipped heart beats.  He is compliant with his meds and is tolerating meds with no SE.    ? ?Past Medical History:  ?Diagnosis Date  ? Blood dyscrasia   ? Cancer [facility])   ? squamous cell  ? Chronic kidney disease   ? acute renal failure no dialysis due to dehydration  ? Colon polyp   ? DVT (deep venous thrombosis) ([place])   ? Dysrhythmia   ? Ejection fraction   ? .  ? GERD (gastroesophageal reflux disease)   ? Hemorrhoid   ? Homozygous Factor V Leiden mutation ([place])   ? Dr. Gretchen Short  ? Hyperlipidemia   ? Hypertension   ? not currently on BP medication  ? PAT (paroxysmal atrial tachycardia) ([place])   ? PONV (postoperative nausea and vomiting)   ? no problems with Propofol use  ? Sleep apnea 2015  ? uses CPAP intermittently  ? ? ?Past Surgical History:  ?Procedure Laterality Date  ? ANTERIOR CERVICAL DECOMP/DISCECTOMY FUSION N/A 11/16/2012  ? Procedure: ANTERIOR CERVICAL DECOMPRESSION/DISCECTOMY FUSION 2 LEVELS;  Surgeon: Charlie Pitter, MD;  Location: [place] NEURO ORS;  Service: Neurosurgery;  Laterality: N/A;  Cervical four-five, cervical five-six anterior cervical decompression fusion with PEEK and plate ?  ? BICEPS TENDON REPAIR    ? right  ? BRAVO [place] STUDY  03/10/2012  ? Procedure: BRAVO [place];  Surgeon: Arta Silence, MD;  [facility];  Service: Endoscopy;  Laterality: N/A;  ? ESOPHAGOGASTRODUODENOSCOPY (EGD) WITH PROPOFOL  03/10/2012  ? Procedure: ESOPHAGOGASTRODUODENOSCOPY (EGD) WITH PROPOFOL;  Surgeon: Arta Silence, MD;  [facility];  Service: Endoscopy;  Laterality: N/A;  ? EYE SURGERY Bilateral   ? lasik  ? FRACTURE SURGERY    ? left ankle- bursectomy  ? HEMORROIDECTOMY  1995  ? KNEE ARTHROSCOPY    ? right  ? LAPAROSCOPIC APPENDECTOMY N/A 04/25/2014  ? Procedure: APPENDECTOMY LAPAROSCOPIC;  Surgeon: Rolm Bookbinder, MD;  Location: [place];  Service: General;  Laterality: N/A;  ? NECK SURGERY    ? [place]  ? SHOULDER SURGERY    ? right  ? UMBILICAL HERNIA REPAIR N/A 04/25/2014  ? Procedure: HERNIA REPAIR UMBILICAL ADULT (PRIMARY);  Surgeon: Rolm Bookbinder, MD;  Location: [place];  Service: General;  Laterality: N/A;  ? VASECTOMY    ? ? ?Current Medications: ?No outpatient medications have been marked as taking for the 07/02/21 encounter (Office Visit) with Sueanne Margarita, MD.  ?  ? ?Allergies:   Amoxicillin, Dexlansoprazole, Lexapro [escitalopram], Sertraline hcl, and Sulfa drugs cross reactors  ? ?Social History  ? ?Socioeconomic History  ? Marital status: Married  ?  Spouse name: Not on file  ? Number of children: Not on file  ? Years of education: Not on file  ? Highest education level: Not on file  ?Occupational History  ? Not on file  ?Tobacco Use  ? Smoking status: Never  ? Smokeless tobacco: Never  ?Vaping Use  ? Vaping Use: Never used  ?Substance and Sexual Activity  ? Alcohol use: Yes  ?  Comment: socially  ? Drug use: No  ? Sexual activity: Yes  ?Other Topics Concern  ? Not on file  ?Social History Narrative  ? Not on file  ? ?Social Determinants of Health  ? ?Financial Resource Strain: Not on file  ?Food Insecurity: Not on file  ?Transportation Needs: Not on file  ?Physical Activity: Not on file  ?Stress: Not on file  ?Social Connections: Not  on file  ?  ? ?Family History: ?The patient's family history includes Cancer in his brother, maternal aunt, mother, and sister. ? ?ROS:   ?Please see the history of present illness.    ?ROS  ?All other systems reviewed and negative.  ? ?EKGs/Labs/Other Studies Reviewed:   ? ?The following studies were reviewed today: ?none ? ?EKG:  EKG is ordered today and demonstrates NSR with no ST changes ? ?Recent Labs: ?No results found for requested labs within last 8760 hours.  ? ?Recent Lipid Panel ?No results found for: CHOL, TRIG, HDL, CHOLHDL, VLDL, LDLCALC, LDLDIRECT ? ?Physical Exam:   ? ?VS:  Ht 5' 9.5" (1.765 m)   BMI 27.66 kg/m?    ? ?Wt Readings from Last 3 Encounters:  ?01/09/21 190 lb (86.2 kg)  ?03/09/20 201 lb 3.2 oz (91.3 kg)  ?01/27/20 192 lb (87.1 kg)  ?  ? ?GEN: Well nourished, well developed in no acute distress ?HEENT: Normal ?NECK: No JVD; No carotid bruits ?LYMPHATICS: No lymphadenopathy ?CARDIAC:RRR, no murmurs, rubs, gallops ?RESPIRATORY:  Clear to auscultation without rales, wheezing or rhonchi  ?ABDOMEN: Soft, non-tender, non-distended ?MUSCULOSKELETAL:  No edema; No deformity  ?SKIN: Warm and dry ?NEUROLOGIC:  Alert and oriented x 3 ?PSYCHIATRIC:  Normal affect   ? ?ASSESSMENT:   ? ?1. PAF (paroxysmal atrial fibrillation) ([place])   ?2. Primary hypertension   ? ?PLAN:   ? ?In order of problems listed above: ? ?1.  PAF/PAT ?-He is maintaining normal sinus on exam but has had a few episodes of PAF that resolved after taking the short acting CCB ?-He has not had any bleeding problems on warfarin ?-Continue prescription drug management with warfarin  ?-increase Cardizem CD to '300mg'$  daily to suppress PAF and ectopy ?-recommend stopping albuterol (he was taking it at the time of his PAF episode) and change to Xopenex ? ?2.  HTN ?-BP is elevated today but has not taken his meds ?-Continue prescription drug management with Cardizem and lisinopril 10 mg daily with PRN refills ?-I have personally reviewed and  interpreted outside labs performed by patient's PCP which showed Hbg 16.2, SCr 1 and K+ 3.8  ? ? ?Medication Adjustments/Labs and Tests Ordered: ?Current medicines are reviewed at length with the patient today.  Concerns regarding medicines are outlined above.  ?Orders Placed This Encounter  ?Procedures  ? EKG 12-Lead  ? ?No orders of the defined types were placed in this encounter. ? ? ?Signed, ?Fransico Him, MD  ?07/02/2021 8:04 AM    ?[place] ?

## 2021-07-02 ENCOUNTER — Encounter: Payer: Self-pay | Admitting: Cardiology

## 2021-07-02 ENCOUNTER — Ambulatory Visit: Payer: 59 | Admitting: Cardiology

## 2021-07-02 ENCOUNTER — Other Ambulatory Visit: Payer: Self-pay

## 2021-07-02 VITALS — BP 142/92 | HR 67 | Ht 69.5 in | Wt 192.0 lb

## 2021-07-02 DIAGNOSIS — I480 Paroxysmal atrial fibrillation: Secondary | ICD-10-CM

## 2021-07-02 DIAGNOSIS — I10 Essential (primary) hypertension: Secondary | ICD-10-CM

## 2021-07-02 MED ORDER — DILTIAZEM HCL ER COATED BEADS 300 MG PO CP24: 300.0000 mg | ORAL_CAPSULE | Freq: Every day | ORAL | 3 refills | Status: DC

## 2021-07-02 NOTE — Patient Instructions (Signed)
Medication Instructions:  ?Your physician has recommended you make the following change in your medication: ?1) INCREASE Cardizem (diltiazem) to 300 mg daily ? ?*If you need a refill on your cardiac medications before your next appointment, please call your pharmacy* ? ?Follow-Up: ?At [facility], you and your health needs are our priority.  As part of our continuing mission to provide you with exceptional heart care, we have created designated Provider Care Teams.  These Care Teams include your primary Cardiologist (physician) and Advanced Practice Providers (APPs -  Physician Assistants and Nurse Practitioners) who all work together to provide you with the care you need, when you need it. ? ?Your next appointment:   ?1 year(s) ? ?The format for your next appointment:   ?In Person ? ?Provider:   ?Fransico Him, MD   ? ? ?

## 2021-07-02 NOTE — Addendum Note (Signed)
Addended by: Antonieta Iba on: 07/02/2021 08:30 AM ? ? Modules accepted: Orders ? ?

## 2021-09-26 ENCOUNTER — Ambulatory Visit: Payer: 59 | Admitting: Surgical

## 2021-09-26 DIAGNOSIS — M1711 Unilateral primary osteoarthritis, right knee: Secondary | ICD-10-CM | POA: Diagnosis not present

## 2021-09-26 NOTE — Progress Notes (Signed)
Office Visit Note   Patient: Steven Conley           Date of Birth: [birth year]           MRN: [id number] Visit Date: 09/26/2021 Requested by: Lavone Orn, MD [address] [place],  [place] [address] PCP: Lavone Orn, MD  Subjective: Chief Complaint  Patient presents with   Right Knee - Pain    HPI: Steven Conley is a 61 y.o. male who presents to the office complaining of right knee pain.  Patient has history of right knee osteoarthritis.  He was initially scheduled for knee replacement surgery last year but had to cancel as he lost contact with the person who is going to be helping him recover from surgery.  He has now gotten back together with his girlfriend and would like to proceed with knee replacement surgery around the first week of August now that he will have help for recovering after surgery.  He states that the knee is giving out when going up and down stairs now.  He has on and off swelling in the leg due to the knee arthritis and due to his history of multiple DVTs in his right lower extremity.  He has history of multiple DVTs, factor V Leiden, atrial fibrillation.  He has never had a pulmonary embolus.  He takes Coumadin chronically and checks his INR at home.  No diabetes.  No smoking history..                ROS: All systems reviewed are negative as they relate to the chief complaint within the history of present illness.  Patient denies fevers or chills.  Assessment & Plan: Visit Diagnoses:  1. Unilateral primary osteoarthritis, right knee     Plan: Plan is post patient for right total knee arthroplasty as discussed at his last appointment.  Discussed the recovery timeframe as well as the risks and benefits of the procedure in great detail with him today in the office.  Discussed the risks and benefits specifically including but not limited to the risk of nerve/blood vessel damage, knee stiffness, knee instability, need for revision surgery in the  future, prosthetic joint infection, medical complication from surgery such as DVT/PE, limitation of his activity in some respects after surgery to protect the knee prosthesis.  After discussion, patient would like to continue and proceed with knee replacement.  He will need a Xarelto or Lovenox bridge which was discussed with Dr. Laurann Montana his PCP after his last appointment leading up to the last cancel knee replacement surgery.  Plan to reach back out to Dr. Laurann Montana.  Patient would prefer Xarelto over Lovenox.  His cardiologist is Dr Fransico Him.  He will need cardiac risk stratification prior to procedure as well.  Follow-up after knee replacement.  Follow-Up Instructions: No follow-ups on file.   Orders:  No orders of the defined types were placed in this encounter.  No orders of the defined types were placed in this encounter.     Procedures: No procedures performed   Clinical Data: No additional findings.  Objective: Vital Signs: There were no vitals taken for this visit.  Physical Exam:  Constitutional: Patient appears well-developed HEENT:  Head: Normocephalic Eyes:EOM are normal Neck: Normal range of motion Cardiovascular: Normal rate Pulmonary/chest: Effort normal Neurologic: Patient is alert Skin: Skin is warm Psychiatric: Patient has normal mood and affect  Ortho Exam: Ortho exam demonstrates right knee with 2 degrees extension and  120 degrees of knee flexion.  Moderate effusion noted.  2+ DP pulse of the right lower extremity.  Patient is able to perform straight leg raise without extensor lag.  No calf tenderness.  Negative Homans' sign.  No significant swelling of the right calf.  Intact ankle dorsiflexion and plantarflexion.  Medial lateral joint line tenderness noted.  Specialty Comments:  No specialty comments available.  Imaging: No results found.   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 ([place]) 05/16/2014   Ejection fraction    PAF (paroxysmal atrial fibrillation) ([place]) 01/09/2013   Warfarin anticoagulation 01/09/2013   Spondylosis, cervical, with myelopathy 11/16/2012   Factor V Leiden mutation ([place]) 09/07/2012   ARF (acute renal failure) ([place]) 09/07/2012   SOB (shortness of breath) 09/07/2012   Chest discomfort 09/07/2012   HTN (hypertension) 09/07/2012   GERD (gastroesophageal reflux disease) 09/07/2012   HLD (hyperlipidemia) 09/07/2012   History of DVT of lower extremity 09/07/2012   Internal hemorrhoids with complication 09/38/1829   Past Medical History:  Diagnosis Date   Blood dyscrasia    Cancer ([place])    squamous cell   Chronic kidney disease    acute renal failure no dialysis due to dehydration   Colon polyp    DVT (deep venous thrombosis) ([facility])    Dysrhythmia    Ejection fraction    .   GERD (gastroesophageal reflux disease)    Hemorrhoid    Homozygous Factor V Leiden mutation ([place])    Dr. Gretchen Short   Hyperlipidemia    Hypertension    not currently on BP medication   PAT (paroxysmal atrial tachycardia) ([facility])    PONV (postoperative nausea and vomiting)    no problems with Propofol use   Sleep apnea 2015   uses CPAP intermittently    Family History  Problem Relation Age of Onset   Cancer Mother        breast   Cancer Sister        breast   Cancer Brother        stomach   Cancer Maternal Aunt        breast    Past Surgical History:  Procedure Laterality Date   ANTERIOR CERVICAL DECOMP/DISCECTOMY FUSION N/A 11/16/2012   Procedure: ANTERIOR CERVICAL DECOMPRESSION/DISCECTOMY FUSION 2 LEVELS;  Surgeon: Charlie Pitter, MD;  Location: [place] NEURO ORS;  Service: Neurosurgery;  Laterality: N/A;  Cervical four-five, cervical five-six anterior cervical decompression fusion with PEEK and plate    BICEPS TENDON REPAIR     right   BRAVO [place]  STUDY  03/10/2012   Procedure: BRAVO [place];  Surgeon: Arta Silence, MD;  [facility];  Service: Endoscopy;  Laterality: N/A;   ESOPHAGOGASTRODUODENOSCOPY (EGD) WITH PROPOFOL  03/10/2012   Procedure: ESOPHAGOGASTRODUODENOSCOPY (EGD) WITH PROPOFOL;  Surgeon: Arta Silence, MD;  [facility];  Service: Endoscopy;  Laterality: N/A;   EYE SURGERY Bilateral    lasik   FRACTURE SURGERY     left ankle- bursectomy   HEMORROIDECTOMY  1995   KNEE ARTHROSCOPY     right   LAPAROSCOPIC APPENDECTOMY N/A 04/25/2014   Procedure: APPENDECTOMY LAPAROSCOPIC;  Surgeon: Rolm Bookbinder, MD;  Location: [place];  Service: General;  Laterality: N/A;   [place]  right   UMBILICAL HERNIA REPAIR N/A 04/25/2014   Procedure: HERNIA REPAIR UMBILICAL ADULT (PRIMARY);  Surgeon: Rolm Bookbinder, MD;  [facility];  Service: General;  Laterality: N/A;   VASECTOMY     Social History   Occupational History   Not on file  Tobacco Use   Smoking status: Never   Smokeless tobacco: Never  Vaping Use   Vaping Use: Never used  Substance and Sexual Activity   Alcohol use: Yes    Comment: socially   Drug use: No   Sexual activity: Yes

## 2021-09-29 ENCOUNTER — Encounter: Payer: Self-pay | Admitting: Orthopedic Surgery

## 2021-10-25 ENCOUNTER — Other Ambulatory Visit: Payer: Self-pay

## 2021-10-30 ENCOUNTER — Other Ambulatory Visit: Payer: Self-pay

## 2021-10-30 MED ORDER — DILTIAZEM HCL 30 MG PO TABS: 30.0000 mg | ORAL_TABLET | Freq: Four times a day (QID) | ORAL | 8 refills | Status: DC | PRN

## 2021-11-12 ENCOUNTER — Encounter: Payer: Self-pay | Admitting: Neurology

## 2021-11-13 ENCOUNTER — Telehealth: Payer: Self-pay | Admitting: *Deleted

## 2021-11-13 NOTE — Telephone Encounter (Signed)
   Pre-operative Risk Assessment    Patient Name: Steven Conley  DOB: 03-09-1961 MRN: [id number]      Request for Surgical Clearance    Procedure:   RIGHT TOTAL KNEE ARTHROPLASTY  Date of Surgery:  Clearance 12/03/21                                 Surgeon:   Surgeon's Group or Practice Name:  [facility] Phone number:  [phone] Fax number:  [phone]   Type of Clearance Requested:   - Medical  - Pharmacy:  Hold Warfarin (Coumadin) NOT INDICATED   Type of Anesthesia:  General    Additional requests/questions:    Astrid Divine   11/13/2021, 1:52 PM

## 2021-11-14 ENCOUNTER — Telehealth: Payer: Self-pay | Admitting: Orthopedic Surgery

## 2021-11-14 ENCOUNTER — Telehealth: Payer: Self-pay

## 2021-11-14 NOTE — Telephone Encounter (Signed)
Patient with diagnosis of afib, DVT and homozygous factor V Leiden mutation on warfarin for anticoagulation. PCP is managing anticoag, recommend that rec comes from their office. He will likely require Lovenox bridging due to his hx of homozygous factor V Leiden. From an afib perspective, he's low risk and fine to hold warfarin as needed.

## 2021-11-14 NOTE — Telephone Encounter (Signed)
  Patient Consent for Virtual Visit        Steven Conley has provided verbal consent on 11/14/2021 for a virtual visit (video or telephone).   CONSENT FOR VIRTUAL VISIT FOR:  Steven Conley  By participating in this virtual visit I agree to the following:  I hereby voluntarily request, consent and authorize [facility] and its employed or contracted physicians, physician assistants, nurse practitioners or other licensed health care professionals (the Practitioner), to provide me with telemedicine health care services (the "Services") as deemed necessary by the treating Practitioner. I acknowledge and consent to receive the Services by the Practitioner via telemedicine. I understand that the telemedicine visit will involve communicating with the Practitioner through live audiovisual communication technology and the disclosure of certain medical information by electronic transmission. I acknowledge that I have been given the opportunity to request an in-person assessment or other available alternative prior to the telemedicine visit and am voluntarily participating in the telemedicine visit.  I understand that I have the right to withhold or withdraw my consent to the use of telemedicine in the course of my care at any time, without affecting my right to future care or treatment, and that the Practitioner or I may terminate the telemedicine visit at any time. I understand that I have the right to inspect all information obtained and/or recorded in the course of the telemedicine visit and may receive copies of available information for a reasonable fee.  I understand that some of the potential risks of receiving the Services via telemedicine include:  Delay or interruption in medical evaluation due to technological equipment failure or disruption; Information transmitted may not be sufficient (e.g. poor resolution of images) to allow for appropriate medical decision making by the  Practitioner; and/or  In rare instances, security protocols could fail, causing a breach of personal health information.  Furthermore, I acknowledge that it is my responsibility to provide information about my medical history, conditions and care that is complete and accurate to the best of my ability. I acknowledge that Practitioner's advice, recommendations, and/or decision may be based on factors not within their control, such as incomplete or inaccurate data provided by me or distortions of diagnostic images or specimens that may result from electronic transmissions. I understand that the practice of medicine is not an exact science and that Practitioner makes no warranties or guarantees regarding treatment outcomes. I acknowledge that a copy of this consent can be made available to me via my patient portal ([facility]), or I can request a printed copy by calling the office of [facility].    I understand that my insurance will be billed for this visit.   I have read or had this consent read to me. I understand the contents of this consent, which adequately explains the benefits and risks of the Services being provided via telemedicine.  I have been provided ample opportunity to ask questions regarding this consent and the Services and have had my questions answered to my satisfaction. I give my informed consent for the services to be provided through the use of telemedicine in my medical care

## 2021-11-14 NOTE — Telephone Encounter (Signed)
Jasmine from Dr. Wonda Amis office called asking for the surgical clearance records to be sent over to their office. So Dr. Laurann Montana can sign off on them. Fax number [phone]

## 2021-11-14 NOTE — Telephone Encounter (Signed)
Primary Cardiologist:Traci Radford Pax, MD   Preoperative team, please contact this patient and set up a phone call appointment for further preoperative risk assessment. Please obtain consent and complete medication review. Thank you for your help.   Please notify requesting provider that coumadin is managed by primary care and will likely require Lovenox bridging due to history of homozygous factor V Leiden. From the perspective of atrial fibrillation, he is at low risk to hold warfarin as needed.   Emmaline Life, NP-C    11/14/2021, 3:57 PM [place] [address] Office [phone] Fax [phone]

## 2021-11-14 NOTE — Telephone Encounter (Signed)
Spoke with patient who is agreeable to have a tele visit appointment on 7/26 at 9 am. Med rec and consent have been done. Pt thanked me for the call.

## 2021-11-21 ENCOUNTER — Telehealth: Payer: Self-pay | Admitting: Orthopedic Surgery

## 2021-11-21 NOTE — Telephone Encounter (Signed)
FMLA Source forms received. To [employer].

## 2021-11-22 ENCOUNTER — Ambulatory Visit (INDEPENDENT_AMBULATORY_CARE_PROVIDER_SITE_OTHER): Payer: 59 | Admitting: Nurse Practitioner

## 2021-11-22 DIAGNOSIS — Z01810 Encounter for preprocedural cardiovascular examination: Secondary | ICD-10-CM

## 2021-11-22 NOTE — Pre-Procedure Instructions (Signed)
Surgical Instructions    Your procedure is scheduled on Tuesday, August 8th.  Report to [facility] Main Entrance "A" at 05:30 A.M., then check in with the Admitting office.  Call this number if you have problems the morning of surgery:  [phone]   If you have any questions prior to your surgery date call [phone]: Open Monday-Friday 8am-4pm    Remember:  Do not eat after midnight the night before your surgery  You may drink clear liquids until 04:30 AM the morning of your surgery.   Clear liquids allowed are: Water, Non-Citrus Juices (without pulp), Carbonated Beverages, Clear Tea, Black Coffee Only (NO MILK, CREAM OR POWDERED CREAMER of any kind), and Gatorade.    Take these medicines the morning of surgery with A SIP OF WATER  atorvastatin (LIPITOR)  diltiazem (CARDIZEM CD)  omeprazole (PRILOSEC)  valACYclovir (VALTREX)  If needed: albuterol (VENTOLIN HFA)- if needed, bring with you on day of surgery diltiazem (CARDIZEM) Propylene Glycol (SYSTANE COMPLETE)   Last dose of warfarin (COUMADIN) 8/2. Follow your instructions from your PCP regarding Lovenox bridge.  As of today, STOP taking any Aspirin (unless otherwise instructed by your surgeon) Aleve, Naproxen, Ibuprofen, Motrin, Advil, Goody's, BC's, all herbal medications, fish oil, and all vitamins.                     Do NOT Smoke (Tobacco/Vaping) for 24 hours prior to your procedure.  If you use a CPAP at night, you may bring your mask/headgear for your overnight stay.   Contacts, glasses, piercing's, hearing aid's, dentures or partials may not be worn into surgery, please bring cases for these belongings.    For patients admitted to the hospital, discharge time will be determined by your treatment team.   Patients discharged the day of surgery will not be allowed to drive home, and someone needs to stay with them for 24 hours.  SURGICAL WAITING ROOM VISITATION Patients having surgery or a procedure may have no  more than 2 support people in the waiting area - these visitors may rotate.   Children under the age of 47 must have an adult with them who is not the patient. If the patient needs to stay at the hospital during part of their recovery, the visitor guidelines for inpatient rooms apply. Pre-op nurse will coordinate an appropriate time for 1 support person to accompany patient in pre-op.  This support person may not rotate.   Please refer to the [facility] website for the visitor guidelines for Inpatients (after your surgery is over and you are in a regular room).    Special instructions:   [place]- Preparing For Surgery  Before surgery, you can play an important role. Because skin is not sterile, your skin needs to be as free of germs as possible. You can reduce the number of germs on your skin by washing with CHG (chlorahexidine gluconate) Soap before surgery.  CHG is an antiseptic cleaner which kills germs and bonds with the skin to continue killing germs even after washing.    Oral Hygiene is also important to reduce your risk of infection.  Remember - BRUSH YOUR TEETH THE MORNING OF SURGERY WITH YOUR REGULAR TOOTHPASTE  Please do not use if you have an allergy to CHG or antibacterial soaps. If your skin becomes reddened/irritated stop using the CHG.  Do not shave (including legs and underarms) for at least 48 hours prior to first CHG shower. It is OK to shave your face.  Please follow these instructions carefully.   Shower the NIGHT BEFORE SURGERY and the MORNING OF SURGERY  If you chose to wash your hair, wash your hair first as usual with your normal shampoo.  After you shampoo, rinse your hair and body thoroughly to remove the shampoo.  Use CHG Soap as you would any other liquid soap. You can apply CHG directly to the skin and wash gently with a scrungie or a clean washcloth.   Apply the CHG Soap to your body ONLY FROM THE NECK DOWN.  Do not use on open wounds or open sores.  Avoid contact with your eyes, ears, mouth and genitals (private parts). Wash Face and genitals (private parts)  with your normal soap.   Wash thoroughly, paying special attention to the area where your surgery will be performed.  Thoroughly rinse your body with warm water from the neck down.  DO NOT shower/wash with your normal soap after using and rinsing off the CHG Soap.  Pat yourself dry with a CLEAN TOWEL.  Wear CLEAN PAJAMAS to bed the night before surgery  Place CLEAN SHEETS on your bed the night before your surgery  DO NOT SLEEP WITH PETS.   Day of Surgery: Take a shower with CHG soap. Do not wear jewelry or makeup Do not wear lotions, powders, colognes, or deodorant. Men may shave face and neck. Do not bring valuables to the hospital. [facility] is not responsible for any belongings or valuables.  Wear Clean/Comfortable clothing the morning of surgery Remember to brush your teeth WITH YOUR REGULAR TOOTHPASTE.   Please read over the following fact sheets that you were given.    If you received a COVID test during your pre-op visit  it is requested that you wear a mask when out in public, stay away from anyone that may not be feeling well and notify your surgeon if you develop symptoms. If you have been in contact with anyone that has tested positive in the last 10 days please notify you surgeon.

## 2021-11-22 NOTE — Progress Notes (Signed)
Virtual Visit via Telephone Note   Because of Edy Belt Uttech's co-morbid illnesses, he is at least at moderate risk for complications without adequate follow up.  This format is felt to be most appropriate for this patient at this time.  The patient did not have access to video technology/had technical difficulties with video requiring transitioning to audio format only (telephone).  All issues noted in this document were discussed and addressed.  No physical exam could be performed with this format.  Please refer to the patient's chart for his consent to telehealth for [facility].  Evaluation Performed:  Preoperative cardiovascular risk assessment _____________   Date:  11/22/2021   Patient ID:  Nonda Lou, DOB 23-May-1960, MRN [id number] Patient Location:  Home Provider location:   Office  Primary Care Provider:  Lavone Orn, MD Primary Cardiologist:  Fransico Him, MD  Chief Complaint / Patient Profile   61 y.o. y/o male with a h/o paroxysmal atrial fibrillation/ paroxysmal atrial tachycardia, hypertension, hyperlipidemia, DVT, factor V Leiden mutation, and OSA who is pending R total knee arthroplasty on 12/03/2021 with [facility] and presents today for telephonic preoperative cardiovascular risk assessment.  Past Medical History    Past Medical History:  Diagnosis Date   Blood dyscrasia    Cancer ([place])    squamous cell   Chronic kidney disease    acute renal failure no dialysis due to dehydration   Colon polyp    DVT (deep venous thrombosis) (HCC)    Dysrhythmia    Ejection fraction    .   GERD (gastroesophageal reflux disease)    Hemorrhoid    Homozygous Factor V Leiden mutation ([place])    Dr. Gretchen Short   Hyperlipidemia    Hypertension    not currently on BP medication   PAT (paroxysmal atrial tachycardia) (HCC)    PONV (postoperative nausea and vomiting)    no problems with Propofol use   Sleep apnea 2015   uses CPAP  intermittently   Past Surgical History:  Procedure Laterality Date   ANTERIOR CERVICAL DECOMP/DISCECTOMY FUSION N/A 11/16/2012   Procedure: ANTERIOR CERVICAL DECOMPRESSION/DISCECTOMY FUSION 2 LEVELS;  Surgeon: Charlie Pitter, MD;  Location: [place] [facility];  Service: Neurosurgery;  Laterality: N/A;  Cervical four-five, cervical five-six anterior cervical decompression fusion with PEEK and plate    BICEPS TENDON REPAIR     right   BRAVO PH STUDY  03/10/2012   Procedure: BRAVO [place];  Surgeon: Arta Silence, MD;  Location: [facility] ENDOS[facility];  Service: Endoscopy;  Laterality: N/A;   ESOPHAGOGASTRODUODENOSCOPY (EGD) WITH PROPOFOL  03/10/2012   Procedure: ESOPHAGOGASTRODUODENOSCOPY (EGD) WITH PROPOFOL;  Surgeon: Arta Silence, MD;  Location: [facility]OSCOPY;  Service: Endoscopy;  Laterality: N/A;   EYE SURGERY Bilateral    lasik   FRACTURE SURGERY     left ankle- bursectomy   HEMORROIDECTOMY  1995   KNEE ARTHROSCOPY     right   LAPAROSCOPIC APPENDECTOMY N/A 04/25/2014   Procedure: APPENDECTOMY LAPAROSCOPIC;  Surgeon: Rolm Bookbinder, MD;  Location: [place];  Service: General;  Laterality: N/A;   [place]     right   UMBILICAL HERNIA REPAIR N/A 04/25/2014   Procedure: HERNIA REPAIR UMBILICAL ADULT (PRIMARY);  Surgeon: Rolm Bookbinder, MD;  Location: [facility] OR;  Service: General;  Laterality: N/A;   VASECTOMY      Allergies  Allergies  Allergen Reactions   Amoxicillin Other (See Comments)  Pt unsure of reaction    Dexilant [Dexlansoprazole] Other (See Comments)    Stomach issues    Lexapro [Escitalopram] Other (See Comments)    Stomach Issues    Sulfa Drugs Cross Reactors Hives   Zoloft [Sertraline Hcl] Other (See Comments)    Stomach issues     History of Present Illness    RONAV FURNEY is a 61 y.o. male who presents via audio/video conferencing for a telehealth visit today.  Pt was last seen in cardiology clinic on 07/02/2021  by Dr. Radford Pax.  At that time TERESA LEMMERMAN was doing well. The patient is now pending procedure as outlined above. Since his last visit, he has been stable overall from a cardiac standpoint he does note intermittent episodes of breakthrough atrial fibrillation with occasional dizziness/lightheadedness, fatigue.  However, he also thinks that uncontrolled sleep apnea could be contributing to daytime fatigue/sleepiness. Despite this, he remains very active.  He denies chest pain, palpitations, dyspnea, pnd, orthopnea, n, v, dizziness, syncope, edema, weight gain, or early satiety. All other systems reviewed and are otherwise negative except as noted above.   Home Medications    Prior to Admission medications   Medication Sig Start Date End Date Taking? Authorizing Provider  albuterol (VENTOLIN HFA) 108 (90 Base) MCG/ACT inhaler Inhale 2 puffs into the lungs every 6 (six) hours as needed for wheezing or shortness of breath.    [provider]  atorvastatin (LIPITOR) 20 MG tablet Take 20 mg by mouth daily. 12/27/19   [provider]  diltiazem (CARDIZEM CD) 300 MG 24 hr capsule Take 1 capsule (300 mg total) by mouth daily. 07/02/21   Sueanne Margarita, MD  diltiazem (CARDIZEM) 30 MG tablet Take 1 tablet (30 mg total) by mouth 4 (four) times daily as needed (fast heart beating). 10/30/21   Sueanne Margarita, MD  doxylamine, Sleep, (UNISOM) 25 MG tablet Take 25 mg by mouth at bedtime as needed for sleep.    [provider]  lisinopril (PRINIVIL,ZESTRIL) 10 MG tablet Take 10 mg by mouth daily.    [provider]  omeprazole (PRILOSEC) 40 MG capsule Take 40 mg by mouth daily.    [provider]  Propylene Glycol (SYSTANE COMPLETE) 0.6 % SOLN Place 1 drop into both eyes daily as needed (dry eyes).    [provider]  testosterone (ANDROGEL) 50 MG/5GM (1%) GEL Place 5 g onto the skin daily.    [provider]  valACYclovir (VALTREX) 1000 MG tablet Take  1,000 mg by mouth daily. 01/18/20   [provider]  vitamin B-12 (CYANOCOBALAMIN) 1000 MCG tablet Take 1,000 mcg by mouth daily.    [provider]  warfarin (COUMADIN) 5 MG tablet Take 2.5-5 mg by mouth See admin instructions. Per patient taking 5 mg on Monday's and taking 2.5 mg Tues.,Wed., Thurs., Fri., Sat., and Sun.    [provider]    Physical Exam    Vital Signs:  PETAR MUCCI does not have vital signs available for review today.  Given telephonic nature of communication, physical exam is limited. AAOx3. NAD. Normal affect.  Speech and respirations are unlabored.  Accessory Clinical Findings    None  Assessment & Plan    1.  Preoperative Cardiovascular Risk Assessment:  According to the Revised Cardiac Risk Index (RCRI), his Perioperative Risk of Major Cardiac Event is (%): 0.4. His Functional Capacity in METs is: 7.99 according to the Duke Activity Status Index (DASI).Therefore, based on ACC/AHA  guidelines, patient would be at acceptable risk for the planned procedure without further cardiovascular testing.  Patient with diagnosis of afib, DVT and homozygous factor V Leiden mutation on warfarin for anticoagulation. PCP is managing anticoagulation, therefore, we recommend that recommendations on holding anticoagulation prior to surgery comes from their office. He will likely require Lovenox bridging due to his hx of homozygous factor V Leiden. From an afib perspective, he's low risk and fine to hold warfarin as needed.  A copy of this note will be routed to requesting surgeon.  Time:   Today, I have spent 20 minutes with the patient with telehealth technology discussing medical history, symptoms, and management plan.     Lenna Sciara, NP  11/22/2021, 10:44 AM

## 2021-11-25 ENCOUNTER — Other Ambulatory Visit: Payer: Self-pay

## 2021-11-25 ENCOUNTER — Encounter (HOSPITAL_COMMUNITY)
Admission: RE | Admit: 2021-11-25 | Discharge: 2021-11-25 | Disposition: A | Discharge status: Home / Self Care | Payer: 59 | Source: Ambulatory Visit | Attending: Orthopedic Surgery | Admitting: Orthopedic Surgery

## 2021-11-25 ENCOUNTER — Encounter (HOSPITAL_COMMUNITY): Payer: Self-pay

## 2021-11-25 VITALS — BP 131/81 | HR 85 | Temp 98.0°F | Resp 17 | Ht 69.0 in | Wt 193.9 lb

## 2021-11-25 DIAGNOSIS — Z01818 Encounter for other preprocedural examination: Secondary | ICD-10-CM | POA: Diagnosis present

## 2021-11-25 DIAGNOSIS — E785 Hyperlipidemia, unspecified: Secondary | ICD-10-CM | POA: Diagnosis not present

## 2021-11-25 DIAGNOSIS — I480 Paroxysmal atrial fibrillation: Secondary | ICD-10-CM | POA: Insufficient documentation

## 2021-11-25 DIAGNOSIS — Z7901 Long term (current) use of anticoagulants: Secondary | ICD-10-CM | POA: Diagnosis not present

## 2021-11-25 DIAGNOSIS — G4733 Obstructive sleep apnea (adult) (pediatric): Secondary | ICD-10-CM | POA: Diagnosis not present

## 2021-11-25 DIAGNOSIS — I10 Essential (primary) hypertension: Secondary | ICD-10-CM | POA: Insufficient documentation

## 2021-11-25 DIAGNOSIS — D6851 Activated protein C resistance: Secondary | ICD-10-CM | POA: Diagnosis not present

## 2021-11-25 LAB — SURGICAL PCR SCREEN
MRSA, PCR: NEGATIVE
Staphylococcus aureus: POSITIVE — AB

## 2021-11-25 NOTE — Progress Notes (Signed)
PCP - Dr. Lavone Orn Cardiologist - Dr. Fransico Him  PPM/ICD - denies   Chest x-ray - 01/09/13 EKG - 07/02/21 Stress Test - 01/27/20 ECHO - 02/06/20 Cardiac Cath - denies  Sleep Study - 09/27/13, OSA+ CPAP - pt does not tolerate  DM- denies  Blood Thinner Instructions: Instructions in physical chart. Last dose coumadin 8/2. Last dose lovenox AM dose 8/7. Aspirin Instructions: n/a  ERAS Protcol - yes, no drink   COVID TEST- n/a   Anesthesia review: yes, cardiac hx  Patient denies shortness of breath, fever, cough and chest pain at PAT appointment   All instructions explained to the patient, with a verbal understanding of the material. Patient agrees to go over the instructions while at home for a better understanding. The opportunity to ask questions was provided.

## 2021-11-26 NOTE — Progress Notes (Signed)
Anesthesia Chart Review:  Follows with cardiology for hx of paroxysmal atrial fibrillation/ paroxysmal atrial tachycardia on coumadin, hypertension, hyperlipidemia, DVT, factor V Leiden mutation, and OSA (intolerant to CPAP). Last seen by Diona Browner, PA-C 11/22/21 via televisit for preop eval. Per note, " According to the Revised Cardiac Risk Index (RCRI), his Perioperative Risk of Major Cardiac Event is (%): 0.4. His Functional Capacity in METs is: 7.99 according to the Duke Activity Status Index (DASI).Therefore, based on ACC/AHA guidelines, patient would be at acceptable risk for the planned procedure without further cardiovascular testing. Patient with diagnosis of afib, DVT and homozygous factor V Leiden mutation on warfarin for anticoagulation. PCP is managing anticoagulation, therefore, we recommend that recommendations on holding anticoagulation prior to surgery comes from their office. He will likely require Lovenox bridging due to his hx of homozygous factor V Leiden. From an afib perspective, he's low risk and fine to hold warfarin as needed."  Pt is on lovenox bridge at the direction of PCP Dr. Laurann Montana. Reports LD of lovenox will be AM dose 12/02/21.  CBC 11/12/21 in care everywhere reviewed, WNL.  CMP 11/12/21 in care everywhere reviewed, WNL.  EKG 07/02/21: NSR. Rate 67  Event monitor 03/01/20: Sinus bradycardia, normal sinus rhythm and sinus tachycardia. The average heart rate was 73bpm and ranged from 51-139bpm. Nonsustained atrial tachycardia. Rare PVC  TTE 02/06/20:  1. Left ventricular ejection fraction, by estimation, is 60 to 65%. The  left ventricle has normal function. The left ventricle has no regional  wall motion abnormalities. There is mild left ventricular hypertrophy.  Left ventricular diastolic parameters  were normal.   2. Right ventricular systolic function is normal. The right ventricular  size is normal. Tricuspid regurgitation signal is inadequate for assessing   PA pressure.   3. The mitral valve is normal in structure. No evidence of mitral valve  regurgitation.   4. The aortic valve is tricuspid. Aortic valve regurgitation is not  visualized. No aortic stenosis is present.   5. The inferior vena cava is normal in size with greater than 50%  respiratory variability, suggesting right atrial pressure of 3 mmHg.   Nuclear stress 01/27/20: Nuclear stress EF: 61%. There was no ST segment deviation noted during stress. The study is normal. This is a low risk study. The left ventricular ejection fraction is normal (55-65%).   Normal stress nuclear study with no ischemia or infarction; EF 61 with normal wall motion.    [facility] Short Stay Center/Anesthesiology Phone [phone] 11/26/2021 1:51 PM

## 2021-11-26 NOTE — Anesthesia Preprocedure Evaluation (Addendum)
Anesthesia Evaluation  Patient identified by MRN, date of birth, ID band Patient awake    Reviewed: Allergy & Precautions, H&P , NPO status , Patient's Chart, lab work & pertinent test results  History of Anesthesia Complications (+) PONV and history of anesthetic complications  Airway Mallampati: II  TM Distance: >3 FB Neck ROM: Full    Dental no notable dental hx. (+) Teeth Intact, Dental Advisory Given   Pulmonary neg pulmonary ROS, sleep apnea ,    Pulmonary exam normal breath sounds clear to auscultation       Cardiovascular Exercise Tolerance: Good hypertension, Pt. on medications negative cardio ROS Normal cardiovascular exam(-) dysrhythmias  Rhythm:Regular Rate:Normal     Neuro/Psych negative neurological ROS  negative psych ROS   GI/Hepatic negative GI ROS, Neg liver ROS, GERD  Medicated,  Endo/Other  negative endocrine ROS  Renal/GU Renal diseasenegative Renal ROS  negative genitourinary   Musculoskeletal negative musculoskeletal ROS (+) Arthritis , Osteoarthritis,    Abdominal   Peds negative pediatric ROS (+)  Hematology negative hematology ROS (+) Blood dyscrasia, ,   Anesthesia Other Findings   Reproductive/Obstetrics negative OB ROS                           Anesthesia Physical Anesthesia Plan  ASA: 3  Anesthesia Plan: MAC, Regional and Spinal   Post-op Pain Management: Minimal or no pain anticipated, Tylenol PO (pre-op)* and Regional block*   Induction: Intravenous  PONV Risk Score and Plan: 1 and Treatment may vary due to age or medical condition, Propofol infusion, Ondansetron, Dexamethasone and Scopolamine patch - Pre-op  Airway Management Planned: Nasal Cannula and Mask  Additional Equipment: None  Intra-op Plan:   Post-operative Plan:   Informed Consent: I have reviewed the patients History and Physical, chart, labs and discussed the procedure including  the risks, benefits and alternatives for the proposed anesthesia with the patient or authorized representative who has indicated his/her understanding and acceptance.       Plan Discussed with: Anesthesiologist  Anesthesia Plan Comments: (PAT note by Karoline Caldwell, PA-C: Follows with cardiology for hx of paroxysmal atrial fibrillation/ paroxysmal atrial tachycardia on coumadin, hypertension,hyperlipidemia, DVT, factor V Leiden mutation, and OSA (intolerant to CPAP). Last seen by Diona Browner, PA-C 11/22/21 via televisit for preop eval. Per note, "According to the Revised Cardiac Risk Index (RCRI),hisPerioperative Risk of Major Cardiac Event is (%): 0.4.HisFunctional Capacity in METs is: 7.99according to the Duke Activity Status Index (DASI).Therefore, based on ACC/AHA guidelines, patient would be at acceptable risk for the planned procedure without further cardiovascular testing. Patient with diagnosis ofafib, DVT and homozygous factor V Leiden mutationon warfarinfor anticoagulation. PCP is managing anticoagulation,therefore, werecommend that recommendations on holding anticoagulation prior to surgerycomes from their office.He will likely require Lovenox bridging due to his hx of homozygous factor V Leiden. From an afib perspective, he's low risk and fine to hold warfarin as needed."  Pt is on lovenox bridge at the direction of PCP Dr. Laurann Montana. Reports LD of lovenox will be AM dose 12/02/21.  CBC 11/12/21 in care everywhere reviewed, WNL.  CMP 11/12/21 in care everywhere reviewed, WNL.  EKG 07/02/21: NSR. Rate 67  Event monitor 03/01/20: . Sinus bradycardia, normal sinus rhythm and sinus tachycardia. The average heart rate was 73bpm and ranged from 51-139bpm. . Nonsustained atrial tachycardia. . Rare PVC  TTE 02/06/20: 1. Left ventricular ejection fraction, by estimation, is 60 to 65%. The  left ventricle has normal function.  The left ventricle has no regional  wall motion  abnormalities. There is mild left ventricular hypertrophy.  Left ventricular diastolic parameters  were normal.  2. Right ventricular systolic function is normal. The right ventricular  size is normal. Tricuspid regurgitation signal is inadequate for assessing  PA pressure.  3. The mitral valve is normal in structure. No evidence of mitral valve  regurgitation.  4. The aortic valve is tricuspid. Aortic valve regurgitation is not  visualized. No aortic stenosis is present.  5. The inferior vena cava is normal in size with greater than 50%  respiratory variability, suggesting right atrial pressure of 3 mmHg.   Nuclear stress 01/27/20: . Nuclear stress EF: 61%. . There was no ST segment deviation noted during stress. . The study is normal. . This is a low risk study. . The left ventricular ejection fraction is normal (55-65%).  Normal stress nuclear study with no ischemia or infarction; EF 61 with normal wall motion. )       Anesthesia Quick Evaluation

## 2021-11-27 ENCOUNTER — Telehealth: Payer: Self-pay | Admitting: Orthopedic Surgery

## 2021-11-27 NOTE — Telephone Encounter (Signed)
Called and left VM

## 2021-11-27 NOTE — Telephone Encounter (Signed)
Patient called stating that he has a total knee replacement surgery scheduled for 8/8 and he had labs done recently and he saw his results on mychart saying that he has a staph infection and was wondering if that will delay his surgery date. CB # [phone]

## 2021-12-03 ENCOUNTER — Ambulatory Visit (HOSPITAL_COMMUNITY): Payer: 59 | Admitting: Physician Assistant

## 2021-12-03 ENCOUNTER — Observation Stay (HOSPITAL_COMMUNITY)
Admission: RE | Admit: 2021-12-03 | Discharge: 2021-12-05 | Disposition: A | Discharge status: Home Health | Payer: 59 | Attending: Orthopedic Surgery | Admitting: Orthopedic Surgery

## 2021-12-03 ENCOUNTER — Ambulatory Visit (HOSPITAL_BASED_OUTPATIENT_CLINIC_OR_DEPARTMENT_OTHER): Payer: 59 | Admitting: Anesthesiology

## 2021-12-03 ENCOUNTER — Encounter (HOSPITAL_COMMUNITY)
Admission: RE | Disposition: A | Discharge status: Home Health | Payer: Self-pay | Source: Home / Self Care | Attending: Orthopedic Surgery

## 2021-12-03 ENCOUNTER — Other Ambulatory Visit: Payer: Self-pay

## 2021-12-03 ENCOUNTER — Encounter (HOSPITAL_COMMUNITY): Payer: Self-pay | Admitting: Orthopedic Surgery

## 2021-12-03 DIAGNOSIS — G4730 Sleep apnea, unspecified: Secondary | ICD-10-CM

## 2021-12-03 DIAGNOSIS — Z86718 Personal history of other venous thrombosis and embolism: Secondary | ICD-10-CM | POA: Diagnosis not present

## 2021-12-03 DIAGNOSIS — N189 Chronic kidney disease, unspecified: Secondary | ICD-10-CM | POA: Diagnosis not present

## 2021-12-03 DIAGNOSIS — Z85828 Personal history of other malignant neoplasm of skin: Secondary | ICD-10-CM | POA: Insufficient documentation

## 2021-12-03 DIAGNOSIS — Z7901 Long term (current) use of anticoagulants: Secondary | ICD-10-CM | POA: Insufficient documentation

## 2021-12-03 DIAGNOSIS — N289 Disorder of kidney and ureter, unspecified: Secondary | ICD-10-CM | POA: Diagnosis not present

## 2021-12-03 DIAGNOSIS — I129 Hypertensive chronic kidney disease with stage 1 through stage 4 chronic kidney disease, or unspecified chronic kidney disease: Secondary | ICD-10-CM | POA: Insufficient documentation

## 2021-12-03 DIAGNOSIS — M1711 Unilateral primary osteoarthritis, right knee: Secondary | ICD-10-CM | POA: Diagnosis present

## 2021-12-03 DIAGNOSIS — I10 Essential (primary) hypertension: Secondary | ICD-10-CM

## 2021-12-03 DIAGNOSIS — Z96651 Presence of right artificial knee joint: Secondary | ICD-10-CM

## 2021-12-03 HISTORY — PX: TOTAL KNEE ARTHROPLASTY: SHX125

## 2021-12-03 LAB — CBC
HCT: 47.3 % (ref 39.0–52.0)
Hemoglobin: 16.5 g/dL (ref 13.0–17.0)
MCH: 31.8 pg (ref 26.0–34.0)
MCHC: 34.9 g/dL (ref 30.0–36.0)
MCV: 91.1 fL (ref 80.0–100.0)
Platelets: 136 10*3/uL — ABNORMAL LOW (ref 150–400)
RBC: 5.19 MIL/uL (ref 4.22–5.81)
RDW: 13.4 % (ref 11.5–15.5)
WBC: 7.1 10*3/uL (ref 4.0–10.5)
nRBC: 0 % (ref 0.0–0.2)

## 2021-12-03 LAB — BASIC METABOLIC PANEL
Anion gap: 9 (ref 5–15)
BUN: 11 mg/dL (ref 8–23)
CO2: 24 mmol/L (ref 22–32)
Calcium: 9.2 mg/dL (ref 8.9–10.3)
Chloride: 105 mmol/L (ref 98–111)
Creatinine, Ser: 1.1 mg/dL (ref 0.61–1.24)
GFR, Estimated: >60 mL/min (ref >60)
Glucose, Bld: 103 mg/dL — ABNORMAL HIGH (ref 70–99)
Potassium: 3.8 mmol/L (ref 3.5–5.1)
Sodium: 138 mmol/L (ref 135–145)

## 2021-12-03 LAB — PROTIME-INR
INR: 1.2 (ref 0.8–1.2)
Prothrombin Time: 14.7 seconds (ref 11.4–15.2)

## 2021-12-03 LAB — APTT: aPTT: 32 seconds (ref 24–36)

## 2021-12-03 SURGERY — ARTHROPLASTY, KNEE, TOTAL
Anesthesia: Monitor Anesthesia Care | Site: Knee | Laterality: Right

## 2021-12-03 MED ORDER — BUPIVACAINE HCL (PF) 0.25 % IJ SOLN
INTRAMUSCULAR | Status: AC
  Filled 2021-12-03: qty 10

## 2021-12-03 MED ORDER — ENOXAPARIN SODIUM 80 MG/0.8ML IJ SOSY
80.0000 mg | PREFILLED_SYRINGE | Freq: Two times a day (BID) | INTRAMUSCULAR | Status: DC
  Administered 2021-12-03 – 2021-12-05 (×4): 80 mg via SUBCUTANEOUS
  Filled 2021-12-03 (×5): qty 0.8

## 2021-12-03 MED ORDER — ROPIVACAINE HCL 7.5 MG/ML IJ SOLN
INTRAMUSCULAR | Status: DC | PRN
  Administered 2021-12-03: 25 mL via PERINEURAL

## 2021-12-03 MED ORDER — BUPIVACAINE HCL (PF) 0.25 % IJ SOLN
INTRAMUSCULAR | Status: DC | PRN
  Administered 2021-12-03: 10 mL

## 2021-12-03 MED ORDER — VANCOMYCIN HCL 1000 MG IV SOLR
INTRAVENOUS | Status: AC
  Filled 2021-12-03: qty 20

## 2021-12-03 MED ORDER — HYDROMORPHONE HCL 1 MG/ML IJ SOLN
0.5000 mg | INTRAMUSCULAR | Status: DC | PRN
  Administered 2021-12-03 – 2021-12-04 (×6): 0.5 mg via INTRAVENOUS
  Filled 2021-12-03 (×6): qty 0.5

## 2021-12-03 MED ORDER — IRRISEPT - 450ML BOTTLE WITH 0.05% CHG IN STERILE WATER, USP 99.95% OPTIME
TOPICAL | Status: DC | PRN
  Administered 2021-12-03: 450 mL via TOPICAL

## 2021-12-03 MED ORDER — CLONIDINE HCL (ANALGESIA) 100 MCG/ML EP SOLN
EPIDURAL | Status: AC
  Filled 2021-12-03: qty 10

## 2021-12-03 MED ORDER — SODIUM CHLORIDE (PF) 0.9 % IJ SOLN
INTRAMUSCULAR | Status: DC | PRN
  Administered 2021-12-03: 60 mL

## 2021-12-03 MED ORDER — DILTIAZEM HCL 30 MG PO TABS: 30.0000 mg | ORAL_TABLET | Freq: Four times a day (QID) | ORAL | Status: DC | PRN

## 2021-12-03 MED ORDER — SODIUM CHLORIDE 0.9 % IV SOLN: INTRAVENOUS | Status: AC

## 2021-12-03 MED ORDER — DILTIAZEM HCL ER COATED BEADS 300 MG PO CP24
300.0000 mg | ORAL_CAPSULE | Freq: Every day | ORAL | Status: DC
  Administered 2021-12-04 – 2021-12-05 (×2): 300 mg via ORAL
  Filled 2021-12-03 (×2): qty 1

## 2021-12-03 MED ORDER — SODIUM CHLORIDE 0.9 % IV SOLN
2000.0000 mg | Freq: Once | INTRAVENOUS | Status: DC
  Filled 2021-12-03: qty 20

## 2021-12-03 MED ORDER — EPHEDRINE 5 MG/ML INJ
INTRAVENOUS | Status: AC
  Filled 2021-12-03: qty 5

## 2021-12-03 MED ORDER — METOCLOPRAMIDE HCL 5 MG/ML IJ SOLN: 5.0000 mg | Freq: Three times a day (TID) | INTRAMUSCULAR | Status: DC | PRN

## 2021-12-03 MED ORDER — BUPIVACAINE LIPOSOME 1.3 % IJ SUSP
INTRAMUSCULAR | Status: AC
  Filled 2021-12-03: qty 20

## 2021-12-03 MED ORDER — MORPHINE SULFATE (PF) 4 MG/ML IV SOLN
INTRAVENOUS | Status: AC
  Filled 2021-12-03: qty 2

## 2021-12-03 MED ORDER — ONDANSETRON HCL 4 MG/2ML IJ SOLN: 4.0000 mg | Freq: Once | INTRAMUSCULAR | Status: DC | PRN

## 2021-12-03 MED ORDER — ALBUTEROL SULFATE (2.5 MG/3ML) 0.083% IN NEBU: 3.0000 mL | INHALATION_SOLUTION | Freq: Four times a day (QID) | RESPIRATORY_TRACT | Status: DC | PRN

## 2021-12-03 MED ORDER — PHENOL 1.4 % MT LIQD: 1.0000 | OROMUCOSAL | Status: DC | PRN

## 2021-12-03 MED ORDER — CEFAZOLIN SODIUM-DEXTROSE 2-4 GM/100ML-% IV SOLN
INTRAVENOUS | Status: AC
  Filled 2021-12-03: qty 100

## 2021-12-03 MED ORDER — SODIUM CHLORIDE 0.9 % IR SOLN
Status: DC | PRN
  Administered 2021-12-03: 3000 mL

## 2021-12-03 MED ORDER — 0.9 % SODIUM CHLORIDE (POUR BTL) OPTIME
TOPICAL | Status: DC | PRN
  Administered 2021-12-03: 1000 mL

## 2021-12-03 MED ORDER — DOCUSATE SODIUM 100 MG PO CAPS
100.0000 mg | ORAL_CAPSULE | Freq: Two times a day (BID) | ORAL | Status: DC
  Administered 2021-12-03 – 2021-12-05 (×5): 100 mg via ORAL
  Filled 2021-12-03 (×5): qty 1

## 2021-12-03 MED ORDER — CEFAZOLIN SODIUM-DEXTROSE 2-4 GM/100ML-% IV SOLN
2.0000 g | Freq: Once | INTRAVENOUS | Status: AC
  Administered 2021-12-03: 2 g via INTRAVENOUS

## 2021-12-03 MED ORDER — PROPOFOL 500 MG/50ML IV EMUL
INTRAVENOUS | Status: DC | PRN
  Administered 2021-12-03: 75 ug/kg/min via INTRAVENOUS

## 2021-12-03 MED ORDER — OXYCODONE HCL 5 MG PO TABS: 5.0000 mg | ORAL_TABLET | Freq: Once | ORAL | Status: DC | PRN

## 2021-12-03 MED ORDER — LACTATED RINGERS IV SOLN: INTRAVENOUS | Status: DC

## 2021-12-03 MED ORDER — ALBUMIN HUMAN 5 % IV SOLN: INTRAVENOUS | Status: DC | PRN

## 2021-12-03 MED ORDER — ACETAMINOPHEN 325 MG PO TABS: 325.0000 mg | ORAL_TABLET | ORAL | Status: DC | PRN

## 2021-12-03 MED ORDER — ACETAMINOPHEN 500 MG PO TABS
1000.0000 mg | ORAL_TABLET | Freq: Four times a day (QID) | ORAL | Status: AC
  Administered 2021-12-03 – 2021-12-04 (×4): 1000 mg via ORAL
  Filled 2021-12-03 (×4): qty 2

## 2021-12-03 MED ORDER — ONDANSETRON HCL 4 MG/2ML IJ SOLN
INTRAMUSCULAR | Status: AC
  Filled 2021-12-03: qty 2

## 2021-12-03 MED ORDER — BUPIVACAINE IN DEXTROSE 0.75-8.25 % IT SOLN
INTRATHECAL | Status: DC | PRN
  Administered 2021-12-03: 2 mL via INTRATHECAL

## 2021-12-03 MED ORDER — CHLORHEXIDINE GLUCONATE 0.12 % MT SOLN
15.0000 mL | Freq: Once | OROMUCOSAL | Status: AC
  Administered 2021-12-03: 15 mL via OROMUCOSAL
  Filled 2021-12-03: qty 15

## 2021-12-03 MED ORDER — METOCLOPRAMIDE HCL 5 MG PO TABS: 5.0000 mg | ORAL_TABLET | Freq: Three times a day (TID) | ORAL | Status: DC | PRN

## 2021-12-03 MED ORDER — MEPERIDINE HCL 25 MG/ML IJ SOLN: 6.2500 mg | INTRAMUSCULAR | Status: DC | PRN

## 2021-12-03 MED ORDER — MORPHINE SULFATE (PF) 4 MG/ML IV SOLN
INTRAVENOUS | Status: DC | PRN
  Administered 2021-12-03: 13 mL

## 2021-12-03 MED ORDER — ATORVASTATIN CALCIUM 10 MG PO TABS
20.0000 mg | ORAL_TABLET | Freq: Every day | ORAL | Status: DC
  Administered 2021-12-04 – 2021-12-05 (×2): 20 mg via ORAL
  Filled 2021-12-03 (×2): qty 2

## 2021-12-03 MED ORDER — OXYCODONE HCL 5 MG/5ML PO SOLN: 5.0000 mg | Freq: Once | ORAL | Status: DC | PRN

## 2021-12-03 MED ORDER — METHOCARBAMOL 500 MG PO TABS
500.0000 mg | ORAL_TABLET | Freq: Four times a day (QID) | ORAL | Status: DC | PRN
  Administered 2021-12-03 – 2021-12-05 (×6): 500 mg via ORAL
  Filled 2021-12-03 (×6): qty 1

## 2021-12-03 MED ORDER — OXYCODONE HCL 5 MG PO TABS: 5.0000 mg | ORAL_TABLET | ORAL | Status: DC

## 2021-12-03 MED ORDER — ONDANSETRON HCL 4 MG PO TABS
4.0000 mg | ORAL_TABLET | Freq: Four times a day (QID) | ORAL | Status: DC | PRN
  Administered 2021-12-04 – 2021-12-05 (×2): 4 mg via ORAL
  Filled 2021-12-03 (×2): qty 1

## 2021-12-03 MED ORDER — ACETAMINOPHEN 160 MG/5ML PO SOLN: 325.0000 mg | ORAL | Status: DC | PRN

## 2021-12-03 MED ORDER — ACETAMINOPHEN 325 MG PO TABS
325.0000 mg | ORAL_TABLET | Freq: Four times a day (QID) | ORAL | Status: DC | PRN
  Administered 2021-12-04 – 2021-12-05 (×2): 650 mg via ORAL
  Filled 2021-12-03 (×2): qty 2

## 2021-12-03 MED ORDER — MIDAZOLAM HCL 2 MG/2ML IJ SOLN
INTRAMUSCULAR | Status: AC
  Filled 2021-12-03: qty 2

## 2021-12-03 MED ORDER — BUPIVACAINE HCL (PF) 0.25 % IJ SOLN
INTRAMUSCULAR | Status: AC
  Filled 2021-12-03: qty 30

## 2021-12-03 MED ORDER — ORAL CARE MOUTH RINSE: 15.0000 mL | Freq: Once | OROMUCOSAL | Status: AC

## 2021-12-03 MED ORDER — PANTOPRAZOLE SODIUM 40 MG PO TBEC
40.0000 mg | DELAYED_RELEASE_TABLET | Freq: Every day | ORAL | Status: DC
  Administered 2021-12-04 – 2021-12-05 (×2): 40 mg via ORAL
  Filled 2021-12-03 (×2): qty 1

## 2021-12-03 MED ORDER — LISINOPRIL 10 MG PO TABS
10.0000 mg | ORAL_TABLET | Freq: Every day | ORAL | Status: DC
  Administered 2021-12-04 – 2021-12-05 (×2): 10 mg via ORAL
  Filled 2021-12-03 (×2): qty 1

## 2021-12-03 MED ORDER — ONDANSETRON HCL 4 MG/2ML IJ SOLN
4.0000 mg | Freq: Four times a day (QID) | INTRAMUSCULAR | Status: DC | PRN
  Administered 2021-12-03 – 2021-12-04 (×4): 4 mg via INTRAVENOUS
  Filled 2021-12-03 (×4): qty 2

## 2021-12-03 MED ORDER — OXYCODONE HCL 5 MG PO TABS
5.0000 mg | ORAL_TABLET | ORAL | Status: DC | PRN
  Administered 2021-12-03 (×2): 10 mg via ORAL
  Filled 2021-12-03 (×2): qty 2

## 2021-12-03 MED ORDER — OXYCODONE HCL 5 MG PO TABS
5.0000 mg | ORAL_TABLET | ORAL | Status: DC
  Administered 2021-12-03 – 2021-12-05 (×11): 5 mg via ORAL
  Filled 2021-12-03 (×11): qty 1

## 2021-12-03 MED ORDER — FENTANYL CITRATE (PF) 100 MCG/2ML IJ SOLN: 25.0000 ug | INTRAMUSCULAR | Status: DC | PRN

## 2021-12-03 MED ORDER — FENTANYL CITRATE (PF) 250 MCG/5ML IJ SOLN
INTRAMUSCULAR | Status: DC | PRN
  Administered 2021-12-03 (×2): 50 ug via INTRAVENOUS

## 2021-12-03 MED ORDER — TRANEXAMIC ACID 1000 MG/10ML IV SOLN
INTRAVENOUS | Status: DC | PRN
  Administered 2021-12-03: 2000 mg via TOPICAL

## 2021-12-03 MED ORDER — METHOCARBAMOL 1000 MG/10ML IJ SOLN: 500.0000 mg | Freq: Four times a day (QID) | INTRAVENOUS | Status: DC | PRN

## 2021-12-03 MED ORDER — MIDAZOLAM HCL 2 MG/2ML IJ SOLN
INTRAMUSCULAR | Status: DC | PRN
  Administered 2021-12-03: 2 mg via INTRAVENOUS

## 2021-12-03 MED ORDER — PROPOFOL 1000 MG/100ML IV EMUL
INTRAVENOUS | Status: AC
  Filled 2021-12-03: qty 100

## 2021-12-03 MED ORDER — PROPOFOL 10 MG/ML IV BOLUS
INTRAVENOUS | Status: DC | PRN
  Administered 2021-12-03: 40 mg via INTRAVENOUS
  Administered 2021-12-03: 50 mg via INTRAVENOUS
  Administered 2021-12-03: 80 mg via INTRAVENOUS

## 2021-12-03 MED ORDER — FENTANYL CITRATE (PF) 250 MCG/5ML IJ SOLN
INTRAMUSCULAR | Status: AC
  Filled 2021-12-03: qty 5

## 2021-12-03 MED ORDER — MENTHOL 3 MG MT LOZG: 1.0000 | LOZENGE | OROMUCOSAL | Status: DC | PRN

## 2021-12-03 MED ORDER — EPHEDRINE SULFATE-NACL 50-0.9 MG/10ML-% IV SOSY
PREFILLED_SYRINGE | INTRAVENOUS | Status: DC | PRN
  Administered 2021-12-03 (×7): 5 mg via INTRAVENOUS

## 2021-12-03 MED ORDER — VANCOMYCIN HCL 1000 MG IV SOLR
INTRAVENOUS | Status: DC | PRN
  Administered 2021-12-03: 1000 mg via TOPICAL

## 2021-12-03 MED ORDER — CEFAZOLIN SODIUM-DEXTROSE 2-4 GM/100ML-% IV SOLN
2.0000 g | Freq: Three times a day (TID) | INTRAVENOUS | Status: AC
  Administered 2021-12-03 (×2): 2 g via INTRAVENOUS
  Filled 2021-12-03 (×2): qty 100

## 2021-12-03 MED ORDER — ONDANSETRON HCL 4 MG/2ML IJ SOLN
INTRAMUSCULAR | Status: DC | PRN
  Administered 2021-12-03: 4 mg via INTRAVENOUS

## 2021-12-03 MED ORDER — DEXAMETHASONE SODIUM PHOSPHATE 10 MG/ML IJ SOLN
INTRAMUSCULAR | Status: DC | PRN
  Administered 2021-12-03: 10 mg

## 2021-12-03 SURGICAL SUPPLY — 88 items
BAG COUNTER SPONGE SURGICOUNT (BAG) ×2 IMPLANT
BAG DECANTER FOR FLEXI CONT (MISCELLANEOUS) ×2 IMPLANT
BAG SPNG CNTER NS LX DISP (BAG) ×1
BANDAGE ESMARK 6X9 LF (GAUZE/BANDAGES/DRESSINGS) ×1 IMPLANT
BLADE SAG 18X100X1.27 (BLADE) ×2 IMPLANT
BLADE SAGITTAL (BLADE) ×2
BLADE SAW THK.89X75X18XSGTL (BLADE) ×1 IMPLANT
BNDG CMPR 9X6 STRL LF SNTH (GAUZE/BANDAGES/DRESSINGS) ×1
BNDG CMPR MED 10X6 ELC LF (GAUZE/BANDAGES/DRESSINGS) ×1
BNDG CMPR MED 15X6 ELC VLCR LF (GAUZE/BANDAGES/DRESSINGS) ×1
BNDG COHESIVE 6X5 TAN STRL LF (GAUZE/BANDAGES/DRESSINGS) ×2 IMPLANT
BNDG ELASTIC 6X10 VLCR STRL LF (GAUZE/BANDAGES/DRESSINGS) ×1 IMPLANT
BNDG ELASTIC 6X15 VLCR STRL LF (GAUZE/BANDAGES/DRESSINGS) ×2 IMPLANT
BNDG ESMARK 6X9 LF (GAUZE/BANDAGES/DRESSINGS) ×2
BOWL SMART MIX CTS (DISPOSABLE) IMPLANT
BSPLAT TIB 5 KN TRITANIUM (Knees) ×1 IMPLANT
CNTNR URN SCR LID CUP LEK RST (MISCELLANEOUS) ×1 IMPLANT
CONT SPEC 4OZ STRL OR WHT (MISCELLANEOUS) ×2
COOLER ICEMAN CLASSIC (MISCELLANEOUS) ×1 IMPLANT
COVER SURGICAL LIGHT HANDLE (MISCELLANEOUS) ×2 IMPLANT
CUFF TOURN SGL QUICK 34 (TOURNIQUET CUFF) ×2
CUFF TOURN SGL QUICK 42 (TOURNIQUET CUFF) IMPLANT
CUFF TRNQT CYL 34X4.125X (TOURNIQUET CUFF) ×1 IMPLANT
DRAPE INCISE IOBAN 66X45 STRL (DRAPES) IMPLANT
DRAPE ORTHO SPLIT 77X108 STRL (DRAPES) ×6
DRAPE SURG ORHT 6 SPLT 77X108 (DRAPES) ×3 IMPLANT
DRAPE U-SHAPE 47X51 STRL (DRAPES) ×2 IMPLANT
DRSG AQUACEL AG ADV 3.5X14 (GAUZE/BANDAGES/DRESSINGS) ×1 IMPLANT
DURAPREP 26ML APPLICATOR (WOUND CARE) ×4 IMPLANT
ELECT CAUTERY BLADE 6.4 (BLADE) ×2 IMPLANT
ELECT REM PT RETURN 9FT ADLT (ELECTROSURGICAL) ×2
ELECTRODE REM PT RTRN 9FT ADLT (ELECTROSURGICAL) ×1 IMPLANT
GAUZE SPONGE 4X4 12PLY STRL (GAUZE/BANDAGES/DRESSINGS) ×2 IMPLANT
GLOVE BIOGEL PI IND STRL 7.0 (GLOVE) ×1 IMPLANT
GLOVE BIOGEL PI IND STRL 8 (GLOVE) ×1 IMPLANT
GLOVE BIOGEL PI INDICATOR 7.0 (GLOVE) ×2
GLOVE BIOGEL PI INDICATOR 8 (GLOVE) ×2
GLOVE ECLIPSE 7.0 STRL STRAW (GLOVE) ×2 IMPLANT
GLOVE ECLIPSE 8.0 STRL XLNG CF (GLOVE) ×2 IMPLANT
GLOVE SURG ENC MOIS LTX SZ6.5 (GLOVE) ×6 IMPLANT
GOWN STRL REUS W/ TWL LRG LVL3 (GOWN DISPOSABLE) ×3 IMPLANT
GOWN STRL REUS W/TWL LRG LVL3 (GOWN DISPOSABLE) ×6
HANDPIECE INTERPULSE COAX TIP (DISPOSABLE) ×2
HOOD PEEL AWAY FLYTE STAYCOOL (MISCELLANEOUS) ×7 IMPLANT
IMMOBILIZER KNEE 20 (SOFTGOODS)
IMMOBILIZER KNEE 20 THIGH 36 (SOFTGOODS) IMPLANT
IMMOBILIZER KNEE 22 UNIV (SOFTGOODS) ×1 IMPLANT
IMMOBILIZER KNEE 24 THIGH 36 (MISCELLANEOUS) IMPLANT
IMMOBILIZER KNEE 24 UNIV (MISCELLANEOUS)
INSERT TRIATH CS X3 11 (Insert) ×1 IMPLANT
JET LAVAGE IRRISEPT WOUND (IRRIGATION / IRRIGATOR) ×2
KIT BASIN OR (CUSTOM PROCEDURE TRAY) ×2 IMPLANT
KIT TURNOVER KIT B (KITS) ×2 IMPLANT
KNEE FEMORAL COMP RT RETAIN (Knees) ×1 IMPLANT
KNEE PATELLA ASYMMETRIC 10X35 (Knees) ×1 IMPLANT
KNEE TIBIAL COMPONENT SZ5 (Knees) ×1 IMPLANT
LAVAGE JET IRRISEPT WOUND (IRRIGATION / IRRIGATOR) IMPLANT
MANIFOLD NEPTUNE II (INSTRUMENTS) ×2 IMPLANT
NDL SPNL 18GX3.5 QUINCKE PK (NEEDLE) ×1 IMPLANT
NEEDLE 22X1 1/2 (OR ONLY) (NEEDLE) ×4 IMPLANT
NEEDLE SPNL 18GX3.5 QUINCKE PK (NEEDLE) ×2 IMPLANT
NS IRRIG 1000ML POUR BTL (IV SOLUTION) ×4 IMPLANT
PACK TOTAL JOINT (CUSTOM PROCEDURE TRAY) ×2 IMPLANT
PAD ARMBOARD 7.5X6 YLW CONV (MISCELLANEOUS) ×4 IMPLANT
PAD CAST 4YDX4 CTTN HI CHSV (CAST SUPPLIES) ×1 IMPLANT
PAD COLD SHLDR WRAP-ON (PAD) ×1 IMPLANT
PADDING CAST COTTON 4X4 STRL (CAST SUPPLIES)
PADDING CAST COTTON 6X4 STRL (CAST SUPPLIES) ×1 IMPLANT
PIN FLUTED HEDLESS FIX 3.5X1/8 (PIN) ×1 IMPLANT
SET HNDPC FAN SPRY TIP SCT (DISPOSABLE) ×1 IMPLANT
SPIKE FLUID TRANSFER (MISCELLANEOUS) ×2 IMPLANT
SPONGE T-LAP 18X18 ~~LOC~~+RFID (SPONGE) ×1 IMPLANT
STRIP CLOSURE SKIN 1/2X4 (GAUZE/BANDAGES/DRESSINGS) ×3 IMPLANT
SUCTION FRAZIER HANDLE 10FR (MISCELLANEOUS) ×2
SUCTION TUBE FRAZIER 10FR DISP (MISCELLANEOUS) ×1 IMPLANT
SUT MNCRL AB 3-0 PS2 18 (SUTURE) ×3 IMPLANT
SUT VIC AB 0 CT1 27 (SUTURE) ×10
SUT VIC AB 0 CT1 27XBRD ANBCTR (SUTURE) ×3 IMPLANT
SUT VIC AB 1 CT1 36 (SUTURE) ×13 IMPLANT
SUT VIC AB 2-0 CT1 27 (SUTURE) ×10
SUT VIC AB 2-0 CT1 TAPERPNT 27 (SUTURE) ×4 IMPLANT
SYR 30ML LL (SYRINGE) ×6 IMPLANT
SYR TB 1ML LUER SLIP (SYRINGE) ×2 IMPLANT
TOWEL GREEN STERILE (TOWEL DISPOSABLE) ×4 IMPLANT
TOWEL GREEN STERILE FF (TOWEL DISPOSABLE) ×4 IMPLANT
TRAY CATH 16FR W/PLASTIC CATH (SET/KITS/TRAYS/PACK) IMPLANT
WATER STERILE IRR 1000ML POUR (IV SOLUTION) IMPLANT
YANKAUER SUCT BULB TIP NO VENT (SUCTIONS) ×2 IMPLANT

## 2021-12-03 NOTE — Anesthesia Procedure Notes (Signed)
Procedure Name: MAC Date/Time: 12/03/2021 7:30 AM  Performed by: Michele Rockers, CRNAPre-anesthesia Checklist: Patient identified, Emergency Drugs available, Suction available, Timeout performed and Patient being monitored Patient Re-evaluated:Patient Re-evaluated prior to induction Oxygen Delivery Method: Simple face mask

## 2021-12-03 NOTE — Brief Op Note (Signed)
   12/03/2021  10:26 AM  PATIENT:  Nonda Lou  61 y.o. male  PRE-OPERATIVE DIAGNOSIS:  right knee osteoarthritis  POST-OPERATIVE DIAGNOSIS:  right knee osteoarthritis  PROCEDURE:  Procedure(s): RIGHT TOTAL KNEE ARTHROPLASTY  SURGEON:  Surgeon(s): Meredith Pel, MD  ASSISTANT: Annie Main  ANESTHESIA:   Spinal  EBL: 125 ml    Total I/O In: 4239 [I.V.:1100; IV Piggyback:350] Out: 625 [Urine:500; Blood:125]  BLOOD ADMINISTERED: none  DRAINS: None  LOCAL MEDICATIONS USED: Marcaine morphine clonidine Exparel vancomycin powder  SPECIMEN:  No Specimen  COUNTS:  YES  TOURNIQUET:   Total Tourniquet Time Documented: Thigh (Right) - 75 minutes Total: Thigh (Right) - 75 minutes   DICTATION: .Other Dictation: Dictation Number [id number]  PLAN OF CARE: Admit for overnight observation  PATIENT DISPOSITION:  PACU - hemodynamically stable

## 2021-12-03 NOTE — Anesthesia Postprocedure Evaluation (Signed)
Anesthesia Post Note  Patient: Steven Conley  Procedure(s) Performed: RIGHT TOTAL KNEE ARTHROPLASTY (Right: Knee)     Patient location during evaluation: PACU Anesthesia Type: Regional, Spinal and MAC Level of consciousness: oriented and awake and alert Pain management: pain level controlled Vital Signs Assessment: post-procedure vital signs reviewed and stable Respiratory status: spontaneous breathing, respiratory function stable and patient connected to nasal cannula oxygen Cardiovascular status: blood pressure returned to baseline and stable Postop Assessment: no headache, no backache and no apparent nausea or vomiting Anesthetic complications: no   No notable events documented.  Last Vitals:  Vitals:   12/03/21 1100 12/03/21 1117  BP: 135/80 (!) 140/85  Pulse: (!) 58 (!) 55  Resp: 13 16  Temp: (!) 36.3 C 36.4 C  SpO2: 96% 97%    Last Pain:  Vitals:   12/03/21 1117  TempSrc:   PainSc: 3                  Latrina Guttman

## 2021-12-03 NOTE — Op Note (Signed)
Steven, Conley MEDICAL RECORD NO: [id number] ACCOUNT NO: [id number] DATE OF BIRTH: 1961/01/19 FACILITY: [facility] LOCATION: [facility] PHYSICIAN: Yetta Barre. Marlou Sa, MD  Operative Report   DATE OF PROCEDURE: 12/03/2021  PREOPERATIVE DIAGNOSIS:  Right knee arthritis.  POSTOPERATIVE DIAGNOSIS:  Right knee arthritis.  PROCEDURE:  Right total knee replacement using press-fit components St[employer]ker Triathlon size 4 femur, 5 tibia, 11 mm deep dish polyethylene insert, 35 mm 3-peg press-fit patella.  SURGEON:  Yetta Barre. Marlou Sa, MD  ASSISTANT:  Annie Main.  INDICATIONS:  The patient is a 61 year old patient with end-stage right knee arthritis, who presents for knee replacement after explanation of risks and benefits.  DESCRIPTION OF PROCEDURE:  The patient was brought to the operating room where spinal anesthetic was induced.  Preoperative antibiotics administered.  Timeout was called.  Right leg was prescrubbed with alcohol and Betadine, allowed to air dry.  Prepped  with DuraPrep solution and draped in a sterile manner.  Ioban used to cover the operative field.  The leg was elevated and exsanguinated with the Esmarch wrap.  Tourniquet was inflated.  Total tourniquet time 75 minutes at 300 mmHg.  After calling the timeout anterior approach to the knee was made.  Skin and subcutaneous tissue were sharply divided.  IrriSept solution utilized.  Median parapatellar arthrotomy was made and marked with #1 Vicryl suture.  IrriSept solution utilized also  in the joint.  Patella was everted.  Fat pad partially excised.  Medial soft tissue dissection performed proportional to the patient's mild to moderate preoperative varus deformity.  Lateral patellofemoral ligament was released.  Soft tissue removed  from the anterior distal femur.  Osteophytes were removed.  ACL released, but it was diminutive at best.  Anterior horn lateral meniscus released.  Hohmann retractor placed.  Posterior retractor placed.  This  was done with avoiding injury to  neurovascular structures.  Next, the tibial cut was made using intramedullary alignment with a slight posterior tilt applied.  This was perpendicular to the mechanical axis. Cut was made with the collaterals and posterior neurovascular structures  protected. 9 mm cut made off the least affected lateral tibial plateau.  Bone quality was excellent.  Intramedullary alignment was then used to make a cut off the femur, cut in 5 degrees of valgus.  The 9 mm spacer block fit well within the extension  gap.  The femur was then sized to a size 4.  Anterior, posterior and chamfer cuts were made in 3 degrees of external rotation, which gave [place] symmetric flexion and extension gap.  A tibial tray was then placed in appropriate rotation along the axis of the  medial third of the tibial tubercle, tapped into position.  With the femoral trial in place, we placed an 11 mm spacer, which gave full extension, full flexion, and very good stability to varus and valgus stress at 0, 30 and 90 degrees, Patella was then  cut down from 27 to 16 mm and a patellar trial was placed.  Patella had excellent tracking and the same stability parameters were maintained.  Trial components were removed.  Tibia was keel punched prior to removing the tibial tray.  Bone quality was  excellent.  Thorough irrigation was performed with 3 liters of pulsatile irrigating solution.  Next, the capsular portion of the knee was injected with the solution of Marcaine, saline and Exparel.  Next tranexamic acid sponge and IrriSept were allowed  to sit in the knee for 3 minutes.  These were removed.  IrriSept  solution, then used to irrigate out the canals and vancomycin powder placed in the tibial canal.  Tibia was then placed with excellent press fit obtained followed by the femur, the spacer  and the patella.  Very good alignment and implant press fit was achieved in the patient's good quality bone.  Tourniquet released at  this time.  Bleeding points encountered controlled using electrocautery, 3 liters of pouring irrigation was then  utilized.  Next, over a bolster the arthrotomy was closed using #1 Vicryl suture.  Prior to closure, IrriSept solution utilized to irrigate the joint and this was followed by vancomycin powder placement.  Then, the knee joint was anesthetized using a  combination of Marcaine, morphine and clonidine.  Skin was closed using 0 Vicryl suture, 2-0 Vicryl suture, and 3-0 Monocryl with Steri-Strips and Aquacel dressing applied.  Ace wrap followed by Gar Gibbon and knee immobilizer placed.  The patient tolerated  the procedure well without immediate complication and transferred to the recovery room in stable condition.  Luke's assistance was required at all times for retraction, opening, closing, mobilization of tissue.  His assistance was a medical necessity for  drilling and all aspects of the case.   PUS D: 12/03/2021 10:32:38 am T: 12/03/2021 12:56:00 pm  JOB: [id number]/ [id number]

## 2021-12-03 NOTE — Evaluation (Addendum)
Physical Therapy Evaluation Patient Details Name: Steven Conley MRN: [id number] DOB: 1960/10/22 Today's Date: 12/03/2021  History of Present Illness  Pt is a 61 y/o male s/p R TKA. PMH includes HTN, sleep apnea, CKD, s/p ACDF, and DVT.  Clinical Impression  Pt admitted secondary to problem above with deficits below. Pt tolerated mobility well and required min guard for ambulation using RW. Reviewed knee precautions and HEP. Anticipate pt will progress well. Will continue to follow acutely.        Recommendations for follow up therapy are one component of a multi-disciplinary discharge planning process, led by the attending physician.  Recommendations may be updated based on patient status, additional functional criteria and insurance authorization.  Follow Up Recommendations Follow physician's recommendations for discharge plan and follow up therapies      Assistance Recommended at Discharge Intermittent Supervision/Assistance  Patient can return home with the following  Assistance with cooking/housework;Assist for transportation    Equipment Recommendations Rolling walker (2 wheels)  Recommendations for Other Services       Functional Status Assessment Patient has had a recent decline in their functional status and demonstrates the ability to make significant improvements in function in a reasonable and predictable amount of time.     Precautions / Restrictions Precautions Precautions: Knee Precaution Booklet Issued: No Precaution Comments: Verbally reviewed knee precautions. Required Braces or Orthoses: Knee Immobilizer - Right Restrictions Weight Bearing Restrictions: Yes RLE Weight Bearing: Weight bearing as tolerated      Mobility  Bed Mobility               General bed mobility comments: Up with RN in bathroom upon entry    Transfers Overall transfer level: Needs assistance Equipment used: Rolling walker (2 wheels) Transfers: Sit to/from Stand Sit to  Stand: Min guard           General transfer comment: Min guard for safety to return to sitting in recliner. Cues for hand placement.    Ambulation/Gait Ambulation/Gait assistance: Min guard Gait Distance (Feet): 110 Feet Assistive device: Rolling walker (2 wheels) Gait Pattern/deviations: Step-to pattern, Decreased step length - left, Decreased step length - right, Decreased weight shift to right Gait velocity: Decreased     General Gait Details: Slow, antalgic gait. Min guard for safety. Cues for sequencing.  Stairs            Wheelchair Mobility    Modified Rankin (Stroke Patients Only)       Balance Overall balance assessment: Needs assistance Sitting-balance support: No upper extremity supported, Feet supported Sitting balance-Leahy Scale: Fair     Standing balance support: Bilateral upper extremity supported Standing balance-Leahy Scale: Poor Standing balance comment: Reliant on BUE support                             Pertinent Vitals/Pain Pain Assessment Pain Assessment: Faces Faces Pain Scale: Hurts little more Pain Location: R knee Pain Descriptors / Indicators: Grimacing, Guarding Pain Intervention(s): Limited activity within patient's tolerance, Monitored during session, Repositioned    Home Living Family/patient expects to be discharged to:: Private residence Living Arrangements: Alone Available Help at Discharge: Available 24 hours/day;Family Type of Home: House Home Access: Stairs to enter Entrance Stairs-Rails: None Entrance Stairs-Number of Steps: 1, 2 Alternate Level Stairs-Number of Steps: flight Home Layout: Two level Home Equipment: Rollator (4 wheels);BSC/3in1      Prior Function Prior Level of Function : Independent/Modified Independent  Hand Dominance        Extremity/Trunk Assessment   Upper Extremity Assessment Upper Extremity Assessment: Overall WFL for tasks assessed    Lower  Extremity Assessment Lower Extremity Assessment: RLE deficits/detail RLE Deficits / Details: Deficits consistent with post op pain and weakness.    Cervical / Trunk Assessment Cervical / Trunk Assessment: Normal  Communication   Communication: No difficulties  Cognition Arousal/Alertness: Awake/alert Behavior During Therapy: WFL for tasks assessed/performed Overall Cognitive Status: Within Functional Limits for tasks assessed                                          General Comments      Exercises Total Joint Exercises Ankle Circles/Pumps: AROM, Both, 10 reps, Seated Quad Sets: AROM, Right, 10 reps, Seated Heel Slides:  (verbally reviewed)   Assessment/Plan    PT Assessment Patient needs continued PT services  PT Problem List Decreased strength;Decreased range of motion;Decreased activity tolerance;Decreased balance;Decreased mobility;Decreased knowledge of use of DME;Decreased knowledge of precautions;Pain       PT Treatment Interventions Gait training;DME instruction;Stair training;Functional mobility training;Therapeutic activities;Therapeutic exercise;Balance training;Patient/family education    PT Goals (Current goals can be found in the Care Plan section)  Acute Rehab PT Goals Patient Stated Goal: to go home PT Goal Formulation: With patient Time For Goal Achievement: 12/17/21 Potential to Achieve Goals: Good    Frequency 7X/week     Co-evaluation               AM-PAC PT "6 Clicks" Mobility  Outcome Measure Help needed turning from your back to your side while in a flat bed without using bedrails?: None Help needed moving from lying on your back to sitting on the side of a flat bed without using bedrails?: A Little Help needed moving to and from a bed to a chair (including a wheelchair)?: A Little Help needed standing up from a chair using your arms (e.g., wheelchair or bedside chair)?: A Little Help needed to walk in hospital room?: A  Little Help needed climbing 3-5 steps with a railing? : A Little 6 Click Score: 19    End of Session Equipment Utilized During Treatment: Gait belt Activity Tolerance: Patient tolerated treatment well Patient left: in chair;with call bell/phone within reach;with family/visitor present Nurse Communication: Mobility status PT Visit Diagnosis: Other abnormalities of gait and mobility (R26.89);Difficulty in walking, not elsewhere classified (R26.2);Pain Pain - Right/Left: Right Pain - part of body: Knee    Time: 9629-5284 PT Time Calculation (min) (ACUTE ONLY): 21 min   Charges:   PT Evaluation $PT Eval Low Complexity: 1 Low          Reuel Derby, PT, DPT  Acute Rehabilitation Services  Office: [phone]   Rudean Hitt 12/03/2021, 4:08 PM

## 2021-12-03 NOTE — Anesthesia Procedure Notes (Signed)
Spinal  Patient location during procedure: OR Start time: 12/03/2021 7:37 AM End time: 12/03/2021 7:38 AM Reason for block: surgical anesthesia Staffing Performed by: Janeece Riggers, MD Authorized by: Janeece Riggers, MD   Preanesthetic Checklist Completed: patient identified, IV checked, site marked, risks and benefits discussed, surgical consent, monitors and equipment checked, pre-op evaluation and timeout performed Spinal Block Patient position: sitting Prep: DuraPrep Patient monitoring: heart rate, cardiac monitor, continuous pulse ox and blood pressure Approach: midline Location: L3-4 Injection technique: single-shot Needle Needle type: Sprotte  Needle gauge: 24 G Needle length: 9 cm Assessment Sensory level: T4 Events: CSF return

## 2021-12-03 NOTE — Progress Notes (Signed)
Patient doing reasonably well following knee replacement on the right Right foot perfused sensate and mobile Patient can do straight leg raise Maybe getting a little nauseated from pain medicine  Plan at this time is changed to scheduled pain medicine instead of as needed Needs social work consult for home health care to be done in [place] where he will be recovering with his partner Needs pulse oximetry tonight.  Has a history of A-fib but rate is controlled  Plan to start Lovenox tonight instead of Xarelto.  He will resume Lovenox and Coumadin tomorrow

## 2021-12-03 NOTE — Transfer of Care (Signed)
Immediate Anesthesia Transfer of Care Note  Patient: Steven Conley  Procedure(s) Performed: RIGHT TOTAL KNEE ARTHROPLASTY (Right: Knee)  Patient Location: PACU  Anesthesia Type:MAC combined with regional for post-op pain  Level of Consciousness: awake, alert , patient cooperative and responds to stimulation  Airway & Oxygen Therapy: Patient Spontanous Breathing  Post-op Assessment: Report given to RN and Post -op Vital signs reviewed and stable  Post vital signs: Reviewed and stable  Last Vitals:  Vitals Value Taken Time  BP 109/78 12/03/21 1020  Temp 36.1 C 12/03/21 1020  Pulse 56 12/03/21 1025  Resp 14 12/03/21 1025  SpO2 95 % 12/03/21 1025  Vitals shown include unvalidated device data.  Last Pain:  Vitals:   12/03/21 0629  TempSrc:   PainSc: 5          Complications: No notable events documented.

## 2021-12-03 NOTE — Anesthesia Procedure Notes (Addendum)
  Anesthesia Regional Block: Adductor canal block   Pre-Anesthetic Checklist: , timeout performed,  Correct Patient, Correct Site, Correct Laterality,  Correct Procedure, Correct Position, site marked,  Risks and benefits discussed,  Surgical consent,  Pre-op evaluation,  At surgeon's request and post-op pain management  Laterality: Right  Prep: chloraprep       Needles:  Injection technique: Single-shot  Needle Type: Echogenic Stimulator Needle     Needle Length: 5cm  Needle Gauge: 22     Additional Needles:   Procedures:,,,, ultrasound used (permanent image in chart),,    Narrative:  Start time: 12/03/2021 7:10 AM End time: 12/03/2021 7:15 AM Injection made incrementally with aspirations every 5 mL.  Performed by: Personally  Anesthesiologist: Janeece Riggers, MD  Additional Notes: Functioning IV was confirmed and monitors were applied.  A 66m 22ga Arrow echogenic stimulator needle was used. Sterile prep and drape,hand hygiene and sterile gloves were used. Ultrasound guidance: relevant anatomy identified, needle position confirmed, local anesthetic spread visualized around nerve(s)., vascular puncture avoided.  Image printed for medical record. Negative aspiration and negative test dose prior to incremental administration of local anesthetic. The patient tolerated the procedure well.

## 2021-12-03 NOTE — Progress Notes (Signed)
Pt started on pulse ox overnight @ 2100, FI02 21%.

## 2021-12-03 NOTE — H&P (Addendum)
TOTAL KNEE ADMISSION H&P  Patient is being admitted for right total knee arthroplasty.  Subjective:  Chief Complaint:right knee pain.  HPI: Steven Conley, 61 y.o. male, has a history of pain and functional disability in the right knee due to arthritis and has failed non-surgical conservative treatments for greater than 12 weeks to includeNSAID's and/or analgesics, corticosteriod injections, viscosupplementation injections, and activity modification.  Onset of symptoms was gradual, starting >10 years ago with gradually worsening course since that time. The patient noted prior procedures on the knee to include  arthroscopy and menisectomy on the right knee(s).  Patient currently rates pain in the right knee(s) at 8 out of 10 with activity. Patient has night pain, worsening of pain with activity and weight bearing, pain that interferes with activities of daily living, pain with passive range of motion, crepitus, and joint swelling.  Patient has evidence of subchondral sclerosis and joint space narrowing by imaging studies. This patient has had  a history of DVT on the right side.  He does have factor V Leiden mutation.  He stopped his Coumadin several days ago in accordance with preoperative medical management recommendations.  Last dose of Lovenox was yesterday morning. . There is no active infection.  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 ([place]) 05/16/2014   Ejection fraction    PAF (paroxysmal atrial fibrillation) ([place]) 01/09/2013   Warfarin anticoagulation 01/09/2013   Spondylosis, cervical, with myelopathy 11/16/2012   Factor V Leiden mutation ([place]) 09/07/2012   ARF (acute renal failure) ([place]) 09/07/2012   SOB (shortness of breath) 09/07/2012   Chest discomfort 09/07/2012   HTN (hypertension) 09/07/2012   GERD (gastroesophageal reflux  disease) 09/07/2012   HLD (hyperlipidemia) 09/07/2012   History of DVT of lower extremity 09/07/2012   Internal hemorrhoids with complication 38/75/6433   Past Medical History:  Diagnosis Date   Blood dyscrasia    Cancer ([place])    squamous cell   Chronic kidney disease    acute renal failure no dialysis due to dehydration   Colon polyp    DVT (deep venous thrombosis) ([facility])    Dysrhythmia    Ejection fraction    .   GERD (gastroesophageal reflux disease)    Hemorrhoid    Homozygous Factor V Leiden mutation ([place])    Dr. Gretchen Short   Hyperlipidemia    Hypertension    not currently on BP medication   PAT (paroxysmal atrial tachycardia) ([facility])    PONV (postoperative nausea and vomiting)    no problems with Propofol use   Sleep apnea 2015   uses CPAP intermittently    Past Surgical History:  Procedure Laterality Date   ANTERIOR CERVICAL DECOMP/DISCECTOMY FUSION N/A 11/16/2012   Procedure: ANTERIOR CERVICAL DECOMPRESSION/DISCECTOMY FUSION 2 LEVELS;  Surgeon: Charlie Pitter, MD;  Location: [facility];  Service: Neurosurgery;  Laterality: N/A;  Cervical four-five, cervical five-six anterior cervical decompression fusion with PEEK and plate    BICEPS TENDON REPAIR     right   BRAVO PH STUDY  03/10/2012   Procedure: BRAVO [place];  Surgeon: Arta Silence, MD;  Location: [facility];  Service: Endoscopy;  Laterality: N/A;   ESOPHAGOGASTRODUODENOSCOPY (EGD) WITH PROPOFOL  03/10/2012   Procedure: ESOPHAGOGASTRODUODENOSCOPY (EGD) WITH PROPOFOL;  Surgeon: Arta Silence, MD;  Location: [facility];  Service: Endoscopy;  Laterality: N/A;   EYE SURGERY  Bilateral    lasik   FRACTURE SURGERY     left ankle- bursectomy   HEMORROIDECTOMY  1995   KNEE ARTHROSCOPY     right   LAPAROSCOPIC APPENDECTOMY N/A 04/25/2014   Procedure: APPENDECTOMY LAPAROSCOPIC;  Surgeon: Rolm Bookbinder, MD;  Location: [facility];  Service: General;  Laterality: N/A;   [place]     right   UMBILICAL HERNIA REPAIR N/A 04/25/2014   Procedure: HERNIA REPAIR UMBILICAL ADULT (PRIMARY);  Surgeon: Rolm Bookbinder, MD;  [facility];  Service: General;  Laterality: N/A;   VASECTOMY      Current Facility-Administered Medications  Medication Dose Route Frequency Provider Last Rate Last Admin   ceFAZolin (ANCEF) 2-4 GM/100ML-% IVPB            ceFAZolin (ANCEF) IVPB 2g/100 mL premix  2 g Intravenous Once Meredith Pel, MD       lactated ringers infusion   Intravenous Continuous Stoltzfus, March Rummage, DO       Allergies  Allergen Reactions   Amoxicillin Other (See Comments)    Pt unsure of reaction    Dexilant [Dexlansoprazole] Other (See Comments)    Stomach issues    Lexapro [Escitalopram] Other (See Comments)    Stomach Issues    Sulfa Drugs Cross Reactors Hives   Zoloft [Sertraline Hcl] Other (See Comments)    Stomach issues     Social History   Tobacco Use   Smoking status: Never   Smokeless tobacco: Never  Substance Use Topics   Alcohol use: Yes    Alcohol/week: 4.0 - 8.0 standard drinks of alcohol    Types: 4 - 8 Cans of beer per week    Comment: 1-2 beers 4 nights/week    Family History  Problem Relation Age of Onset   Cancer Mother        breast   Cancer Sister        breast   Cancer Brother        stomach   Cancer Maternal Aunt        breast     Review of Systems  Musculoskeletal:  Positive for arthralgias.  All other systems reviewed and are negative.   Objective:  Physical Exam Vitals reviewed.  HENT:     Head: Normocephalic.     Nose: Nose normal.     Mouth/Throat:     Mouth: Mucous membranes are moist.  Eyes:     Pupils: Pupils are equal, round, and reactive to light.  Cardiovascular:     Rate and Rhythm: Normal rate.     Pulses: Normal pulses.  Pulmonary:     Effort: Pulmonary effort is normal.  Abdominal:     General: Abdomen is flat.  Musculoskeletal:     Cervical back:  Normal range of motion.  Skin:    General: Skin is warm.     Capillary Refill: Capillary refill takes less than 2 seconds.  Neurological:     General: No focal deficit present.     Mental Status: He is alert.  Psychiatric:        Mood and Affect: Mood normal.   Ortho exam demonstrates right knee with 2 degrees extension and 120 degrees of knee flexion.  Moderate effusion noted.  2+ DP pulse of the right lower extremity.  Patient is able to perform straight leg raise without extensor lag.  No calf tenderness.  Negative Bevelyn Buckles'  sign.  No significant swelling of the right calf.  Intact ankle dorsiflexion and plantarflexion.  Medial lateral joint line tenderness noted.  Vital signs in last 24 hours: Temp:  [97.7 F (36.5 C)] 97.7 F (36.5 C) (08/08 0600) Pulse Rate:  [65] 65 (08/08 0600) Resp:  [17] 17 (08/08 0600) BP: (153)/(89) 153/89 (08/08 0600) SpO2:  [96 %] 96 % (08/08 0600) Weight:  [85.3 kg] 85.3 kg (08/08 0600)  Labs:   Estimated body mass index is 27.76 kg/m as calculated from the following:   Height as of this encounter: '5\' 9"'$  (1.753 m).   Weight as of this encounter: 85.3 kg.   Imaging Review Plain radiographs demonstrate severe degenerative joint disease of the right knee(s). The overall alignment ismild varus. The bone quality appears to be good for age and reported activity level.      Assessment/Plan:  End stage arthritis, right knee   The patient history, physical examination, clinical judgment of the provider and imaging studies are consistent with end stage degenerative joint disease of the right knee(s) and total knee arthroplasty is deemed medically necessary. The treatment options including medical management, injection therapy arthroscopy and arthroplasty were discussed at length. The risks and benefits of total knee arthroplasty were presented and reviewed. The risks due to aseptic loosening, infection, stiffness, patella tracking problems, thromboembolic  complications and other imponderables were discussed. The patient acknowledged the explanation, agreed to proceed with the plan and consent was signed. Patient is being admitted for inpatient treatment for surgery, pain control, PT, OT, prophylactic antibiotics, VTE prophylaxis, progressive ambulation and ADL's and discharge planning. The patient is planning to be discharged home with home health services plan to consult with pharmacy about appropriateness of IV and or topical TXA.  We will restart DVT prophylaxis tonight.  Patient understands the increased risk he faces with his history of DVT in that right lower extremity.     Patient's anticipated LOS is less than 2 midnights, meeting these requirements: - Younger than 52 - Lives within 1 hour of care - Has a competent adult at home to recover with post-op recover - NO history of  - Chronic pain requiring opiods  - Diabetes  - Coronary Artery Disease  - Heart failure  - Heart attack  - Stroke  - DVT/VTE  - Cardiac arrhythmia  - Respiratory Failure/COPD  - Renal failure  - Anemia  - Advanced Liver disease

## 2021-12-03 NOTE — Progress Notes (Signed)
[place] [facility] for Anticoagulation Indication: VTE prophylaxis  Allergies  Allergen Reactions   Amoxicillin Other (See Comments)    Pt unsure of reaction    Dexilant [Dexlansoprazole] Other (See Comments)    Stomach issues    Lexapro [Escitalopram] Other (See Comments)    Stomach Issues    Sulfa Drugs Cross Reactors Hives   Zoloft [Sertraline Hcl] Other (See Comments)    Stomach issues     Patient Measurements: Height: '5\' 9"'$  (175.3 cm) Weight: 85.3 kg (188 lb) IBW/kg (Calculated) : 70.7  Vital Signs: Temp: 97.6 F (36.4 C) (08/08 1117) Temp Source: Oral (08/08 0600) BP: 140/85 (08/08 1117) Pulse Rate: 55 (08/08 1117)  Labs: Recent Labs    12/03/21 0636  HGB 16.5  HCT 47.3  PLT 136*  APTT 32  LABPROT 14.7  INR 1.2  CREATININE 1.10    Estimated Creatinine Clearance: 74.4 mL/min (by C-G formula based on SCr of 1.1 mg/dL).   Medical History: Past Medical History:  Diagnosis Date   Blood dyscrasia    Cancer ([facility])    squamous cell   Chronic kidney disease    acute renal failure no dialysis due to dehydration   Colon polyp    DVT (deep venous thrombosis) (HCC)    Dysrhythmia    Ejection fraction    .   GERD (gastroesophageal reflux disease)    Hemorrhoid    Homozygous Factor V Leiden mutation ([place])    Dr. Lenna Sciara. [place],PCP-follows   Hyperlipidemia    Hypertension    not currently on BP medication   PAT (paroxysmal atrial tachycardia) (HCC)    PONV (postoperative nausea and vomiting)    no problems with Propofol use   Sleep apnea 2015   uses CPAP intermittently    Assessment: 61 year old patient with medical history significant for factor V Leiden mutation, hx DVT, and atrial fibrillation on warfarin PTA admitted for total R knee replacement. Warfarin held PTA since 11/28/2021 and bridged outpatient with enoxaparin '80mg'$  SQ BID since 11/29/2021.   Per discussion with Dr. Marlou Sa and Dr. Lorin Mercy, will continue to hold warfarin  this evening and resume enoxaparin in the interim. CBC stable, platelets 136.  Goal of Therapy:  Monitor platelets by anticoagulation protocol: Yes   Plan:  Enoxaparin '80mg'$  SQ twice daily starting this evening F/u tomorrow for anticoagulation plan  Continue to monitor H&H and platelets    Thank you for allowing pharmacy to be a part of this patient's care.  Ardyth Harps, PharmD Clinical Pharmacist

## 2021-12-03 NOTE — Progress Notes (Signed)
Orthopedic Tech Progress Note Patient Details:  Steven Conley 1961-03-20 [id number]  Ortho Devices Type of Ortho Device: Bone foam zero knee Ortho Device/Splint Location: RLE Ortho Device/Splint Interventions: Ordered   Post Interventions Patient Tolerated: Well Instructions Provided: Care of device  Janit Pagan 12/03/2021, 11:44 AM

## 2021-12-04 DIAGNOSIS — M1711 Unilateral primary osteoarthritis, right knee: Secondary | ICD-10-CM | POA: Diagnosis not present

## 2021-12-04 LAB — CBC
HCT: 44.5 % (ref 39.0–52.0)
Hemoglobin: 15.3 g/dL (ref 13.0–17.0)
MCH: 31.4 pg (ref 26.0–34.0)
MCHC: 34.4 g/dL (ref 30.0–36.0)
MCV: 91.2 fL (ref 80.0–100.0)
Platelets: 121 10*3/uL — ABNORMAL LOW (ref 150–400)
RBC: 4.88 MIL/uL (ref 4.22–5.81)
RDW: 13.2 % (ref 11.5–15.5)
WBC: 11 10*3/uL — ABNORMAL HIGH (ref 4.0–10.5)
nRBC: 0 % (ref 0.0–0.2)

## 2021-12-04 MED ORDER — WARFARIN SODIUM 5 MG PO TABS
5.0000 mg | ORAL_TABLET | Freq: Once | ORAL | Status: AC
  Administered 2021-12-04: 5 mg via ORAL
  Filled 2021-12-04: qty 1

## 2021-12-04 MED ORDER — WARFARIN - PHYSICIAN DOSING INPATIENT: Freq: Every day | Status: DC

## 2021-12-04 NOTE — Plan of Care (Signed)
  Problem: Clinical Measurements: Goal: Ability to maintain clinical measurements within normal limits will improve Outcome: Progressing Goal: Will remain free from infection Outcome: Progressing Goal: Diagnostic test results will improve Outcome: Progressing Goal: Respiratory complications will improve Outcome: Progressing Goal: Cardiovascular complication will be avoided Outcome: Progressing   Problem: Elimination: Goal: Will not experience complications related to bowel motility Outcome: Progressing Goal: Will not experience complications related to urinary retention Outcome: Progressing   Problem: Activity: Goal: Risk for activity intolerance will decrease Outcome: Progressing   Problem: Pain Managment: Goal: General experience of comfort will improve Outcome: Progressing   Problem: Safety: Goal: Ability to remain free from injury will improve Outcome: Progressing   Problem: Skin Integrity: Goal: Risk for impaired skin integrity will decrease Outcome: Progressing

## 2021-12-04 NOTE — TOC Initial Note (Addendum)
Transition of Care [facility]) - Initial/Assessment Note    Patient Details  Name: Steven Conley MRN: [id number] Date of Birth: 1960-06-15  [facility] CM/SW Contact:    Pollie Friar, RN Phone Number: 12/04/2021, 3:05 PM  Clinical Narrative:                 Patient will be discharging to [facility] [place] when medically ready. He will have needed support in [place].  Walker for home ordered through [place] and will be delivered to the room.  Pt states they have CPM machine for home already.  Pt needs home PT once discharged. CM has asked [facility] and they have declined. CM has asked [facility] and they can accept but would not see until Tues/ Wednesday of next week. CM has asked [facility] [phone]) and [facility] and am awaiting a decision.  TOC following.  1552: CM heard back from [facility] that they can accept for [facility] PT. CM will update the patient and place information on the AVS.  Expected Discharge Plan: [place] Barriers to Discharge: Continued Medical Work up   Patient Goals and CMS Choice   CMS Medicare.gov Compare Post Acute Care list provided to:: Patient Choice offered to / list presented to : Patient  Expected Discharge Plan and Services Expected Discharge Plan: [place]   Discharge Planning Services: CM Consult Post Acute Care Choice: Home Health, Durable Medical Equipment Living arrangements for the past 2 months: Single Family Home                 DME Arranged: Walker rolling DME Agency: [employer] Date DME Agency Contacted: 12/04/21   Representative spoke with at DME Agency: [place]: PT          Prior Living Arrangements/Services Living arrangements for the past 2 months: [place]   Patient language and need for interpreter reviewed:: Yes Do you feel safe going back to the place where you live?: Yes      Need for Family Participation in Patient Care: Yes (Comment) Care giver  support system in place?: Yes (comment)   Criminal Activity/Legal Involvement Pertinent to Current Situation/Hospitalization: No - Comment as needed  Activities of Daily Living Home Assistive Devices/Equipment: Eyeglasses, Blood pressure cuff ADL Screening (condition at time of admission) Patient's cognitive ability adequate to safely complete daily activities?: Yes Is the patient deaf or have difficulty hearing?: No Does the patient have difficulty seeing, even when wearing glasses/contacts?: Yes Does the patient have difficulty concentrating, remembering, or making decisions?: No Patient able to express need for assistance with ADLs?: Yes Does the patient have difficulty dressing or bathing?: No Independently performs ADLs?: Yes (appropriate for developmental age) Does the patient have difficulty walking or climbing stairs?: Yes Weakness of Legs: Right Weakness of Arms/Hands: None  Permission Sought/Granted                  Emotional Assessment Appearance:: Appears stated age Attitude/Demeanor/Rapport: Engaged Affect (typically observed): Accepting Orientation: : Oriented to Self, Oriented to Place, Oriented to  Time, Oriented to Situation   Psych Involvement: No (comment)  Admission diagnosis:  S/P total knee arthroplasty, right [Z96.651] Patient Active Problem List   Diagnosis Date Noted   S/P total knee arthroplasty, right 12/03/2021   PAT (paroxysmal atrial tachycardia) (HCC)    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 ([place]) 05/16/2014   Ejection fraction  PAF (paroxysmal atrial fibrillation) ([place]) 01/09/2013   Warfarin anticoagulation 01/09/2013   Spondylosis, cervical, with myelopathy 11/16/2012   Factor V Leiden mutation ([place]) 09/07/2012   ARF (acute renal failure) ([place]) 09/07/2012   SOB (shortness of breath) 09/07/2012   Chest discomfort 09/07/2012   HTN (hypertension) 09/07/2012   GERD (gastroesophageal  reflux disease) 09/07/2012   HLD (hyperlipidemia) 09/07/2012   History of DVT of lower extremity 09/07/2012   Internal hemorrhoids with complication 98/01/2547   PCP:  Lavone Orn, MD Pharmacy:   [facility], [place] [address] Phone: [phone] Fax: [phone]     Social Determinants of Health (SDOH) Interventions    Readmission Risk Interventions     No data to display

## 2021-12-04 NOTE — Progress Notes (Signed)
  Subjective: Steven Conley is a 61 y.o. male s/p right TKA.  They are POD 1.  Pt's pain is controlled.  Pt denies numbness/tingling/weakness.  Pt has ambulated with some difficulty.  Perform well with physical therapy.  No complaint of chest pain, shortness of breath, headache, calf pain, fevers, chills.  Just feels groggy.  Objective: Vital signs in last 24 hours: Temp:  [97 F (36.1 C)-98.5 F (36.9 C)] 98 F (36.7 C) (08/09 0808) Pulse Rate:  [50-79] 77 (08/09 0808) Resp:  [13-18] 18 (08/09 0808) BP: (109-171)/(75-95) 164/95 (08/09 0808) SpO2:  [93 %-97 %] 96 % (08/09 0808)  Intake/Output from previous day: 08/08 0701 - 08/09 0700 In: 1906.3 [P.O.:240; I.V.:1216.3; IV Piggyback:450] Out: 1638 [Urine:2250; Blood:125] Intake/Output this shift: No intake/output data recorded.  Exam:  No gross blood or drainage overlying the dressing 2+ DP pulse Sensation intact distally in the right foot Able to dorsiflex and plantarflex the right foot No calf tenderness bilaterally.  Negative Homans' sign bilaterally.  Able to perform straight leg raise with right leg weakly.  Labs: Recent Labs    12/03/21 0636 12/04/21 0151  HGB 16.5 15.3   Recent Labs    12/03/21 0636 12/04/21 0151  WBC 7.1 11.0*  RBC 5.19 4.88  HCT 47.3 44.5  PLT 136* 121*   Recent Labs    12/03/21 0636  NA 138  K 3.8  CL 105  CO2 24  BUN 11  CREATININE 1.10  GLUCOSE 103*  CALCIUM 9.2   Recent Labs    12/03/21 0636  INR 1.2    Assessment/Plan: Pt is POD 1 s/p right TKA.    -Plan to discharge to home today or tomorrow pending patient's pain and PT eval  -He is planning on going to [facility] with his partner in order to rehab there.  Plan to try and arrange, physical therapy for his address in [place].  If this is not possible, could consider outpatient therapy.  Plan to continue with Lovenox and Coumadin regimen as directed by Dr. Laurann Montana; Kela Millin has instructions from Dr. Laurann Montana on  his phone.  -WBAT with a walker    Donella Stade 12/04/2021, 8:38 AM

## 2021-12-04 NOTE — Progress Notes (Signed)
Physical Therapy Treatment Patient Details Name: Steven Conley MRN: [id number] DOB: 20-Apr-1961 Today's Date: 12/04/2021   History of Present Illness Pt is a 61 y/o male s/p R TKA. PMH includes HTN, sleep apnea, CKD, s/p ACDF, and DVT.    PT Comments    Pt with excellent progress. Pt able to increase gait distance to 320 feet between two trials. Pt also able to safely and effectively ascend and descend stairs specific to his home environment. Pt's HEP reviewed. Pt ready for discharge from physical therapy standpoint. However, pt will be seen tomorrow AM if not yet discharged. Gait belt provided for home use.    Recommendations for follow up therapy are one component of a multi-disciplinary discharge planning process, led by the attending physician.  Recommendations may be updated based on patient status, additional functional criteria and insurance authorization.  Follow Up Recommendations  Follow physician's recommendations for discharge plan and follow up therapies     Assistance Recommended at Discharge Intermittent Supervision/Assistance  Patient can return home with the following Assistance with cooking/housework;Assist for transportation;Help with stairs or ramp for entrance;A little help with walking and/or transfers;A little help with bathing/dressing/bathroom   Equipment Recommendations  Rolling walker (2 wheels)    Recommendations for Other Services       Precautions / Restrictions Precautions Precautions: Fall (monitor dizziness) Precaution Booklet Issued: No Precaution Comments: Verbally reviewed knee precautions. Required Braces or Orthoses: Knee Immobilizer - Right Knee Immobilizer - Right: On when out of bed or walking Restrictions Weight Bearing Restrictions: Yes RLE Weight Bearing: Weight bearing as tolerated     Mobility  Bed Mobility Overal bed mobility: Needs Assistance Bed Mobility: Supine to Sit     Supine to sit: Supervision, HOB elevated      General bed mobility comments: increased time required    Transfers Overall transfer level: Needs assistance Equipment used: Rolling walker (2 wheels) Transfers: Sit to/from Stand Sit to Stand: Min guard           General transfer comment: Cues for hand placement and technique. Several sit <> stands performed this encounter both from bed and chair.    Ambulation/Gait Ambulation/Gait assistance: Min guard Gait Distance (Feet): 320 Feet Assistive device: Rolling walker (2 wheels) Gait Pattern/deviations: Step-to pattern, Step-through pattern, Decreased stride length, Decreased weight shift to right, Antalgic Gait velocity: decreased Gait velocity interpretation: 1.31 - 2.62 ft/sec, indicative of limited community ambulator   General Gait Details: Pt ambulated distances of 220 feet and 100 feet with stair training between trials. Pt able to progress from step to pattern to step through reciprocal pattern. Pt with fairly good endurance. No LOB occurred. Chair followed pt via pt's spouse and used during stair demonstration.   Stairs Stairs: Yes Stairs assistance: Min guard Stair Management: One rail Right, Step to pattern, Forwards, With cane, With walker, No rails Number of Stairs: 13 General stair comments: Pt ascended and descended 12 stairs respectively with single point cane and rail on R (ascending; on L when descending). Pt then performed platform step with RW. Pt required cues and demonstration for sequencing, safety, and technique. Spouse present throughout.   Wheelchair Mobility    Modified Rankin (Stroke Patients Only)       Balance                                            Cognition  Arousal/Alertness: Awake/alert Behavior During Therapy: WFL for tasks assessed/performed Overall Cognitive Status: Within Functional Limits for tasks assessed                                          Exercises Total Joint Exercises Quad  Sets: Right, Supine (few reps for review) Heel Slides: Right, Supine (few reps for review) Straight Leg Raises: Right, Supine (single rep (weakness))    General Comments General comments (skin integrity, edema, etc.): HR and SpO2 stable on RA; pt reported dizziness which was fairly stable and monitored throughout      Pertinent Vitals/Pain Pain Assessment Pain Assessment: 0-10 Pain Score: 6  Pain Location: R knee Pain Descriptors / Indicators: Grimacing, Guarding Pain Intervention(s): Premedicated before session, Monitored during session, Limited activity within patient's tolerance    Home Living                          Prior Function            PT Goals (current goals can now be found in the care plan section) Acute Rehab PT Goals Patient Stated Goal: home PT Goal Formulation: With patient Time For Goal Achievement: 12/17/21 Potential to Achieve Goals: Good Progress towards PT goals: Progressing toward goals    Frequency    7X/week      PT Plan Current plan remains appropriate    Co-evaluation              AM-PAC PT "6 Clicks" Mobility   Outcome Measure  Help needed turning from your back to your side while in a flat bed without using bedrails?: A Little Help needed moving from lying on your back to sitting on the side of a flat bed without using bedrails?: A Little Help needed moving to and from a bed to a chair (including a wheelchair)?: A Little Help needed standing up from a chair using your arms (e.g., wheelchair or bedside chair)?: A Little Help needed to walk in hospital room?: A Little Help needed climbing 3-5 steps with a railing? : A Little 6 Click Score: 18    End of Session Equipment Utilized During Treatment: Gait belt;Right knee immobilizer Activity Tolerance: Patient tolerated treatment well;Patient limited by pain Patient left: in chair;with call bell/phone within reach;with family/visitor present Nurse Communication:  Mobility status PT Visit Diagnosis: Other abnormalities of gait and mobility (R26.89);Difficulty in walking, not elsewhere classified (R26.2);Pain Pain - Right/Left: Right Pain - part of body: Knee     Time: 6734-1937 PT Time Calculation (min) (ACUTE ONLY): 47 min  Charges:  $Gait Training: 23-37 mins $Therapeutic Exercise: 8-22 mins $Therapeutic Activity: 8-22 mins                     Donna Bernard, PT    [facility] 12/04/2021, 2:08 PM

## 2021-12-04 NOTE — Progress Notes (Signed)
Physical Therapy Treatment Patient Details Name: Steven Conley MRN: [id number] DOB: [birth year] Today's Date: 12/04/2021   History of Present Illness Pt is a 61 y/o male s/p R TKA. PMH includes HTN, sleep apnea, CKD, s/p ACDF, and DVT.    PT Comments    Pt limited this AM by fatigue and nausea. He required supervision bed mobility, min guard assist transfers, and min guard assist amb 100' with RW. Pt with c/o nausea, dizziness, and fatigue. Chair follow for safety with gait. Returned to room in recliner. Pt performed LE exercises in recliner. HEP handouts provided. Pt will need to complete stair training prior to d/c home.   Recommendations for follow up therapy are one component of a multi-disciplinary discharge planning process, led by the attending physician.  Recommendations may be updated based on patient status, additional functional criteria and insurance authorization.  Follow Up Recommendations  Follow physician's recommendations for discharge plan and follow up therapies     Assistance Recommended at Discharge Intermittent Supervision/Assistance  Patient can return home with the following Assistance with cooking/housework;Assist for transportation   Equipment Recommendations  Rolling walker (2 wheels)    Recommendations for Other Services       Precautions / Restrictions Precautions Precautions: Fall Precaution Comments: Verbally reviewed knee precautions. Required Braces or Orthoses: Knee Immobilizer - Right Knee Immobilizer - Right: On when out of bed or walking Restrictions RLE Weight Bearing: Weight bearing as tolerated     Mobility  Bed Mobility Overal bed mobility: Needs Assistance Bed Mobility: Supine to Sit     Supine to sit: Supervision, HOB elevated     General bed mobility comments: increased time, +rail    Transfers Overall transfer level: Needs assistance Equipment used: Rolling walker (2 wheels) Transfers: Sit to/from Stand Sit to Stand:  Min guard, From elevated surface           General transfer comment: cues for hand placement    Ambulation/Gait Ambulation/Gait assistance: Min guard, +2 safety/equipment Gait Distance (Feet): 100 Feet Assistive device: Rolling walker (2 wheels) Gait Pattern/deviations: Step-to pattern, Step-through pattern, Decreased stride length, Decreased weight shift to right Gait velocity: decreased Gait velocity interpretation: <1.31 ft/sec, indicative of household ambulator   General Gait Details: slow, antalgic gait. Cues for sequencing. Chair follow for safety.   Stairs             Wheelchair Mobility    Modified Rankin (Stroke Patients Only)       Balance Overall balance assessment: Needs assistance Sitting-balance support: No upper extremity supported, Feet supported Sitting balance-Leahy Scale: Good     Standing balance support: Bilateral upper extremity supported, During functional activity, Reliant on assistive device for balance Standing balance-Leahy Scale: Poor                              Cognition Arousal/Alertness: Awake/alert Behavior During Therapy: WFL for tasks assessed/performed Overall Cognitive Status: Within Functional Limits for tasks assessed                                          Exercises Total Joint Exercises Ankle Circles/Pumps: AROM, Both, 10 reps Quad Sets: AROM, Right, 10 reps Short Arc Quad: AROM, Right, 10 reps Heel Slides: AAROM, Right, 10 reps Hip ABduction/ADduction: AAROM, Right, 10 reps Straight Leg Raises: AAROM, Right, 10 reps  General Comments General comments (skin integrity, edema, etc.): Pt reports he was in a fib earlier today and unsure if he has converted back to NSR. Fatigued quickly.      Pertinent Vitals/Pain Pain Assessment Pain Assessment: 0-10 Pain Score: 4  Pain Location: R knee Pain Descriptors / Indicators: Grimacing, Guarding Pain Intervention(s): Limited activity  within patient's tolerance, Monitored during session, Repositioned, Ice applied    Home Living                          Prior Function            PT Goals (current goals can now be found in the care plan section) Acute Rehab PT Goals Patient Stated Goal: home Progress towards PT goals: Progressing toward goals    Frequency    7X/week      PT Plan Current plan remains appropriate    Co-evaluation              AM-PAC PT "6 Clicks" Mobility   Outcome Measure  Help needed turning from your back to your side while in a flat bed without using bedrails?: None Help needed moving from lying on your back to sitting on the side of a flat bed without using bedrails?: A Little Help needed moving to and from a bed to a chair (including a wheelchair)?: A Little Help needed standing up from a chair using your arms (e.g., wheelchair or bedside chair)?: A Little Help needed to walk in hospital room?: A Little Help needed climbing 3-5 steps with a railing? : A Little 6 Click Score: 19    End of Session Equipment Utilized During Treatment: Gait belt Activity Tolerance: Other (comment) (nausea and dizziness) Patient left: in chair;with call bell/phone within reach;with family/visitor present Nurse Communication: Mobility status PT Visit Diagnosis: Other abnormalities of gait and mobility (R26.89);Difficulty in walking, not elsewhere classified (R26.2);Pain Pain - Right/Left: Right Pain - part of body: Knee     Time: 6381-7711 PT Time Calculation (min) (ACUTE ONLY): 36 min  Charges:  $Gait Training: 8-22 mins $Therapeutic Exercise: 8-22 mins                     Lorrin Goodell, PT  Office # [phone] Pager [phone]    Lorriane Shire 12/04/2021, 12:23 PM

## 2021-12-05 ENCOUNTER — Encounter (HOSPITAL_COMMUNITY): Payer: Self-pay | Admitting: Orthopedic Surgery

## 2021-12-05 DIAGNOSIS — M1711 Unilateral primary osteoarthritis, right knee: Secondary | ICD-10-CM | POA: Diagnosis not present

## 2021-12-05 LAB — CBC
HCT: 41.6 % (ref 39.0–52.0)
Hemoglobin: 14.5 g/dL (ref 13.0–17.0)
MCH: 31.7 pg (ref 26.0–34.0)
MCHC: 34.9 g/dL (ref 30.0–36.0)
MCV: 90.8 fL (ref 80.0–100.0)
Platelets: 115 10*3/uL — ABNORMAL LOW (ref 150–400)
RBC: 4.58 MIL/uL (ref 4.22–5.81)
RDW: 13.3 % (ref 11.5–15.5)
WBC: 14 10*3/uL — ABNORMAL HIGH (ref 4.0–10.5)
nRBC: 0 % (ref 0.0–0.2)

## 2021-12-05 MED ORDER — METHOCARBAMOL 500 MG PO TABS
500.0000 mg | ORAL_TABLET | Freq: Three times a day (TID) | ORAL | 0 refills | Status: DC | PRN
Start: 2021-12-05 — End: 2022-11-12

## 2021-12-05 MED ORDER — DOCUSATE SODIUM 100 MG PO CAPS: 100.0000 mg | ORAL_CAPSULE | Freq: Two times a day (BID) | ORAL | 0 refills | Status: DC

## 2021-12-05 MED ORDER — ONDANSETRON HCL 4 MG PO TABS: 4.0000 mg | ORAL_TABLET | Freq: Three times a day (TID) | ORAL | 0 refills | Status: DC | PRN

## 2021-12-05 MED ORDER — ACETAMINOPHEN 325 MG PO TABS: 650.0000 mg | ORAL_TABLET | Freq: Four times a day (QID) | ORAL | 0 refills | Status: AC | PRN

## 2021-12-05 MED ORDER — OXYCODONE HCL 5 MG PO TABS: 5.0000 mg | ORAL_TABLET | ORAL | 0 refills | Status: DC | PRN

## 2021-12-05 NOTE — Progress Notes (Signed)
Physical Therapy Treatment Patient Details Name: Steven Conley MRN: [id number] DOB: 05-15-1960 Today's Date: 12/05/2021   History of Present Illness Pt is a 61 y/o male s/p R TKA. PMH includes HTN, sleep apnea, CKD, s/p ACDF, and DVT.    PT Comments    Pt seen for continued mobility progression and education prior to anticipated d/c. The pt was able to make good progress with speed and fluidity of gait within session, continues to rely on BUE support to tolerate wt bearing in RLE. No overt LOB or instability noted, even with no UE support in stance. Pt and family educated on stair training, mobility progression, exercises, and recommendations for fall risk reduction. No further questions at this time. Will continue to follow acutely to maintain progress and mobility, is safe to return home when medically cleared for d/c.   Gait Speed: 0.55ms using RW. (Gait speed <0.644m indicates increased risk of falls and dependence in ADLs)   Recommendations for follow up therapy are one component of a multi-disciplinary discharge planning process, led by the attending physician.  Recommendations may be updated based on patient status, additional functional criteria and insurance authorization.  Follow Up Recommendations  Follow physician's recommendations for discharge plan and follow up therapies     Assistance Recommended at Discharge Intermittent Supervision/Assistance  Patient can return home with the following Assistance with cooking/housework;Assist for transportation;Help with stairs or ramp for entrance;A little help with walking and/or transfers;A little help with bathing/dressing/bathroom   Equipment Recommendations  Rolling walker (2 wheels)    Recommendations for Other Services       Precautions / Restrictions Precautions Precautions: Fall Precaution Booklet Issued: No Precaution Comments: Verbally reviewed knee precautions. Required Braces or Orthoses: Knee Immobilizer -  Right Knee Immobilizer - Right: On when out of bed or walking Restrictions Weight Bearing Restrictions: Yes RLE Weight Bearing: Weight bearing as tolerated     Mobility  Bed Mobility Overal bed mobility: Needs Assistance Bed Mobility: Supine to Sit     Supine to sit: Supervision, HOB elevated     General bed mobility comments: increased time required    Transfers Overall transfer level: Needs assistance Equipment used: Rolling walker (2 wheels) Transfers: Sit to/from Stand Sit to Stand: Min guard           General transfer comment: single instance of minA, minG from various low surfaces. increased time to rise.    Ambulation/Gait Ambulation/Gait assistance: Min guard, Supervision Gait Distance (Feet): 250 Feet (with seated rest at halfway) Assistive device: Rolling walker (2 wheels) Gait Pattern/deviations: Step-to pattern, Step-through pattern, Decreased stride length, Decreased weight shift to right, Antalgic Gait velocity: 0.31 m/s Gait velocity interpretation: <1.31 ft/sec, indicative of household ambulator   General Gait Details: pt ambulating ~125 ft then seated rest then 125 ft back to room. improved fluidity and speed with continued gait. progressed to step-through   Stairs         General stair comments: discussed stair technique    Balance Overall balance assessment: Mild deficits observed, not formally tested         Standing balance support: Bilateral upper extremity supported, No upper extremity supported, During functional activity Standing balance-Leahy Scale: Fair Standing balance comment: static stand without UE support and no LOB, BUE support with gait for pain management                            Cognition Arousal/Alertness: Awake/alert Behavior During Therapy:  WFL for tasks assessed/performed Overall Cognitive Status: Within Functional Limits for tasks assessed                                           Exercises Total Joint Exercises Ankle Circles/Pumps: AROM, Both, 10 reps Quad Sets: Right, Supine (few reps for review) Heel Slides: Right, Supine (few reps for review) Hip ABduction/ADduction: AAROM, Right, 5 reps, Seated    General Comments General comments (skin integrity, edema, etc.): VSS on RA      Pertinent Vitals/Pain Pain Assessment Pain Assessment: 0-10 Pain Score: 7  Pain Location: R knee Pain Descriptors / Indicators: Grimacing, Guarding Pain Intervention(s): Monitored during session, Limited activity within patient's tolerance, Repositioned, Ice applied     PT Goals (current goals can now be found in the care plan section) Acute Rehab PT Goals Patient Stated Goal: home PT Goal Formulation: With patient Time For Goal Achievement: 12/17/21 Potential to Achieve Goals: Good Progress towards PT goals: Progressing toward goals    Frequency    7X/week      PT Plan Current plan remains appropriate       AM-PAC PT "6 Clicks" Mobility   Outcome Measure  Help needed turning from your back to your side while in a flat bed without using bedrails?: None Help needed moving from lying on your back to sitting on the side of a flat bed without using bedrails?: A Little Help needed moving to and from a bed to a chair (including a wheelchair)?: A Little Help needed standing up from a chair using your arms (e.g., wheelchair or bedside chair)?: A Little Help needed to walk in hospital room?: A Little Help needed climbing 3-5 steps with a railing? : A Little 6 Click Score: 19    End of Session Equipment Utilized During Treatment: Gait belt;Right knee immobilizer Activity Tolerance: Patient tolerated treatment well;Patient limited by pain Patient left: in chair;with call bell/phone within reach;with family/visitor present Nurse Communication: Mobility status PT Visit Diagnosis: Other abnormalities of gait and mobility (R26.89);Difficulty in walking, not elsewhere  classified (R26.2);Pain Pain - Right/Left: Right Pain - part of body: Knee     Time: 0821-0916 PT Time Calculation (min) (ACUTE ONLY): 55 min  Charges:  $Gait Training: 23-37 mins $Therapeutic Exercise: 8-22 mins $Self Care/Home Management: 8-22                     West Carbo, PT, DPT   Acute Rehabilitation Department   Sandra Cockayne 12/05/2021, 9:24 AM

## 2021-12-05 NOTE — Progress Notes (Signed)
  Subjective: Steven Conley is a 61 y.o. male s/p right TKA.  They are POD 2.  Pt's pain is controlled.  Pt denies numbness/tingling/weakness.  Pt has ambulated with some difficulty; was able to walk about 250 feet this morning with PT and did very well with stair training yesterday.  No complaint of chest pain, shortness of breath, headache, calf pain, fevers, chills.  Does note some spasm and mild pain in the anterior quad region.  Objective: Vital signs in last 24 hours: Temp:  [98.5 F (36.9 C)] 98.5 F (36.9 C) (08/09 2223) Pulse Rate:  [68-93] 68 (08/10 0616) Resp:  [16-18] 16 (08/10 0616) BP: (129-164)/(88-99) 129/88 (08/10 0616) SpO2:  [95 %-97 %] 96 % (08/10 0616)  Intake/Output from previous day: 08/09 0701 - 08/10 0700 In: 960 [P.O.:960] Out: 1550 [Urine:1550] Intake/Output this shift: Total I/O In: -  Out: 1800 [Urine:1800]  Exam:  No gross blood or drainage overlying the dressing 2+ DP pulse Sensation intact distally in the right foot Able to dorsiflex and plantarflex the right foot No calf tenderness bilaterally.  Negative Homans' sign bilaterally.  Able to perform straight leg raise with right leg very weakly.  Labs: Recent Labs    12/03/21 0636 12/04/21 0151 12/05/21 0213  HGB 16.5 15.3 14.5   Recent Labs    12/04/21 0151 12/05/21 0213  WBC 11.0* 14.0*  RBC 4.88 4.58  HCT 44.5 41.6  PLT 121* 115*   Recent Labs    12/03/21 0636  NA 138  K 3.8  CL 105  CO2 24  BUN 11  CREATININE 1.10  GLUCOSE 103*  CALCIUM 9.2   Recent Labs    12/03/21 0636  INR 1.2    Assessment/Plan: Pt is POD 1 s/p right TKA.    -Plan to discharge to home today   -He will continue Lovenox until tomorrow per Dr. Delene Ruffini instructions and take 2.5 mg of Coumadin today when he gets home around 4:00.  He will check his INR tomorrow and call Dr. Delene Ruffini office to discuss further steps in regards to his anticoagulation.  -WBAT with a walker    Steven Conley L  Steven Conley 12/05/2021, 12:05 PM

## 2021-12-06 ENCOUNTER — Encounter: Payer: Self-pay | Admitting: Orthopedic Surgery

## 2021-12-08 DIAGNOSIS — M1711 Unilateral primary osteoarthritis, right knee: Secondary | ICD-10-CM

## 2021-12-09 ENCOUNTER — Encounter: Payer: Self-pay | Admitting: Orthopedic Surgery

## 2021-12-09 ENCOUNTER — Ambulatory Visit (INDEPENDENT_AMBULATORY_CARE_PROVIDER_SITE_OTHER): Payer: 59 | Admitting: Orthopedic Surgery

## 2021-12-09 ENCOUNTER — Ambulatory Visit (HOSPITAL_COMMUNITY)
Admission: RE | Admit: 2021-12-09 | Discharge: 2021-12-09 | Disposition: A | Discharge status: Home / Self Care | Payer: 59 | Source: Ambulatory Visit | Attending: Orthopedic Surgery | Admitting: Orthopedic Surgery

## 2021-12-09 ENCOUNTER — Ambulatory Visit: Payer: Self-pay

## 2021-12-09 ENCOUNTER — Telehealth: Payer: Self-pay

## 2021-12-09 DIAGNOSIS — Z96651 Presence of right artificial knee joint: Secondary | ICD-10-CM

## 2021-12-09 DIAGNOSIS — M1711 Unilateral primary osteoarthritis, right knee: Secondary | ICD-10-CM

## 2021-12-09 NOTE — Telephone Encounter (Signed)
FYI-  Per Apolonio Schneiders with [facility], patient is Negative for acute DVT, right leg.  Please advise.  Thank you.

## 2021-12-09 NOTE — Progress Notes (Addendum)
Post-Op Visit Note   Patient: Steven Conley           Date of Birth: 1960/12/02           MRN: [id number] Visit Date: 12/09/2021 PCP: Lavone Orn, MD   Assessment & Plan:  Chief Complaint:  Chief Complaint  Patient presents with   Right Knee - Routine Post Op    Right total knee arthroplasty 12/03/2021   Visit Diagnoses:  1. Unilateral primary osteoarthritis, right knee     Plan: Steven Conley is a 61 year old patient is 6 days out right total knee replacement.  He is on Lovenox and Coumadin.  Having some swelling in his leg.  On examination he has range of motion about 5-60.  Pretty reasonable patella mobility.  Effusion is present.  Has some Swelling.  Plan at this time is ultrasound rule out DVT.  Hold off on therapy until the Coumadin is therapeutic at 2-3.  Follow-up next week for clinical recheck.  At the time of this dictation the ultrasound did come back and it was positive for chronic DVT in the right lower extremity.  Recommendation at this time is to resume the twice a day Lovenox until he is therapeutic with his Coumadin.  He will need to double check that with Dr. Laurann Montana as well.  I did call and talk with Kela Millin.  He is going to talk to Dr. Laurann Montana and for some reason he cannot reach him my recommendation is to resume the Lovenox until his INR is above the 2.0 level.  Prescription also given today for more test strips so he can check that at home.  Follow-Up Instructions: No follow-ups on file.   Orders:  No orders of the defined types were placed in this encounter.  No orders of the defined types were placed in this encounter.   Imaging: No results found.  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) 01/09/2013   Warfarin anticoagulation 01/09/2013   Spondylosis, cervical, with myelopathy 11/16/2012   Factor V Leiden mutation ([place]) 09/07/2012   ARF (acute renal failure) ([place]) 09/07/2012   SOB (shortness of breath) 09/07/2012   Chest discomfort 09/07/2012   HTN (hypertension) 09/07/2012   GERD (gastroesophageal reflux disease) 09/07/2012   HLD (hyperlipidemia) 09/07/2012   History of DVT of lower extremity 09/07/2012   Internal hemorrhoids with complication 39/06/90   Past Medical History:  Diagnosis Date   Blood dyscrasia    Cancer ([place])    squamous cell   Chronic kidney disease    acute renal failure no dialysis due to dehydration   Colon polyp    DVT (deep venous thrombosis) ([place])    Dysrhythmia    Ejection fraction    .   GERD (gastroesophageal reflux disease)    Hemorrhoid    Homozygous Factor V Leiden mutation ([place])    Dr. Gretchen Short   Hyperlipidemia    Hypertension    not currently on BP medication   PAT (paroxysmal atrial tachycardia) (HCC)    PONV (postoperative nausea and vomiting)    no problems with Propofol use   Sleep apnea 2015   uses CPAP intermittently  Family History  Problem Relation Age of Onset   Cancer Mother        breast   Cancer Sister        breast   Cancer Brother        stomach   Cancer Maternal Aunt        breast    Past Surgical History:  Procedure Laterality Date   ANTERIOR CERVICAL DECOMP/DISCECTOMY FUSION N/A 11/16/2012   Procedure: ANTERIOR CERVICAL DECOMPRESSION/DISCECTOMY FUSION 2 LEVELS;  Surgeon: Charlie Pitter, MD;  Location: [facility];  Service: Neurosurgery;  Laterality: N/A;  Cervical four-five, cervical five-six anterior cervical decompression fusion with PEEK and plate    BICEPS TENDON REPAIR     right   BRAVO PH STUDY  03/10/2012   Procedure: BRAVO [place];  Surgeon: Arta Silence, MD;  Location: [facility];  Service: Endoscopy;   Laterality: N/A;   ESOPHAGOGASTRODUODENOSCOPY (EGD) WITH PROPOFOL  03/10/2012   Procedure: ESOPHAGOGASTRODUODENOSCOPY (EGD) WITH PROPOFOL;  Surgeon: Arta Silence, MD;  Location: [facility];  Service: Endoscopy;  Laterality: N/A;   EYE SURGERY Bilateral    lasik   FRACTURE SURGERY     left ankle- bursectomy   HEMORROIDECTOMY  1995   KNEE ARTHROSCOPY     right   LAPAROSCOPIC APPENDECTOMY N/A 04/25/2014   Procedure: APPENDECTOMY LAPAROSCOPIC;  Surgeon: Rolm Bookbinder, MD;  Location: [facility];  Service: General;  Laterality: N/A;   [place]     right   TOTAL KNEE ARTHROPLASTY Right 12/03/2021   Procedure: RIGHT TOTAL KNEE ARTHROPLASTY;  Surgeon: Meredith Pel, MD;  Location: [facility];  Service: Orthopedics;  Laterality: Right;   UMBILICAL HERNIA REPAIR N/A 04/25/2014   Procedure: HERNIA REPAIR UMBILICAL ADULT (PRIMARY);  Surgeon: Rolm Bookbinder, MD;  [facility];  Service: General;  Laterality: N/A;   VASECTOMY     Social History   Occupational History   Not on file  Tobacco Use   Smoking status: Never   Smokeless tobacco: Never  Vaping Use   Vaping Use: Never used  Substance and Sexual Activity   Alcohol use: Yes    Alcohol/week: 4.0 - 8.0 standard drinks of alcohol    Types: 4 - 8 Cans of beer per week    Comment: 1-2 beers 4 nights/week   Drug use: No   Sexual activity: Yes

## 2021-12-09 NOTE — Progress Notes (Signed)
Lower extremity venous right study completed.  Preliminary results relayed to April for Marlou Sa, MD.  See CV Proc for preliminary results report.   Darlin Coco, RDMS, RVT

## 2021-12-18 ENCOUNTER — Encounter: Payer: Self-pay | Admitting: Orthopedic Surgery

## 2021-12-18 ENCOUNTER — Ambulatory Visit (INDEPENDENT_AMBULATORY_CARE_PROVIDER_SITE_OTHER): Payer: 59 | Admitting: Orthopedic Surgery

## 2021-12-18 ENCOUNTER — Ambulatory Visit (INDEPENDENT_AMBULATORY_CARE_PROVIDER_SITE_OTHER): Payer: 59

## 2021-12-18 DIAGNOSIS — Z96651 Presence of right artificial knee joint: Secondary | ICD-10-CM

## 2021-12-18 MED ORDER — OXYCODONE HCL 5 MG PO TABS: 5.0000 mg | ORAL_TABLET | ORAL | 0 refills | Status: DC | PRN

## 2021-12-18 MED ORDER — METHOCARBAMOL 500 MG PO TABS: 500.0000 mg | ORAL_TABLET | Freq: Three times a day (TID) | ORAL | 0 refills | Status: DC | PRN

## 2021-12-18 NOTE — Progress Notes (Signed)
Post-Op Visit Note   Patient: Steven Conley           Date of Birth: June 19, 1960           MRN: [id number] Visit Date: 12/18/2021 PCP: Steven Orn, MD   Assessment & Plan:  Chief Complaint:  Chief Complaint  Patient presents with   Right Knee - Routine Post Op    12/03/21 right TKA   Visit Diagnoses:  1. S/P total knee arthroplasty, right     Plan: Steven Conley is a 61 year old patient is now 2 weeks out right total knee replacement.  Has a history of factor V Leiden.  Currently therapeutic on Coumadin.  Having difficulty sleeping.  On examination he comes to within 5 degrees of full extension and can sit with the knee flexed to 90.  Incision intact.  No calf tenderness today.  Plan at this time is refill oxycodone that he is taking at nighttime only.  Refill Robaxin.  Prescription for physical therapy given for Steven Conley.  Outpatient PT to start next week.  Follow-up in 4 weeks for clinical recheck on range of motion.  Follow-Up Instructions: No follow-ups on file.   Orders:  Orders Placed This Encounter  Procedures   XR Knee 1-2 Views Right   No orders of the defined types were placed in this encounter.   Imaging: No results found.  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 ([place]) 05/16/2014   Ejection fraction    PAF (paroxysmal atrial fibrillation) ([place]) 01/09/2013   Warfarin anticoagulation 01/09/2013   Spondylosis, cervical, with myelopathy 11/16/2012   Factor V Leiden mutation ([place]) 09/07/2012   ARF (acute renal failure) ([place]) 09/07/2012   SOB (shortness of breath) 09/07/2012   Chest discomfort 09/07/2012   HTN (hypertension) 09/07/2012   GERD (gastroesophageal reflux disease) 09/07/2012   HLD (hyperlipidemia) 09/07/2012    History of DVT of lower extremity 09/07/2012   Internal hemorrhoids with complication 07/37/1062   Past Medical History:  Diagnosis Date   Blood dyscrasia    Cancer ([place])    squamous cell   Chronic kidney disease    acute renal failure no dialysis due to dehydration   Colon polyp    DVT (deep venous thrombosis) ([place])    Dysrhythmia    Ejection fraction    .   GERD (gastroesophageal reflux disease)    Hemorrhoid    Homozygous Factor V Leiden mutation ([place])    Steven Conley,PCP-follows   Hyperlipidemia    Hypertension    not currently on BP medication   PAT (paroxysmal atrial tachycardia) (HCC)    PONV (postoperative nausea and vomiting)    no problems with Propofol use   Sleep apnea 2015   uses CPAP intermittently    Family History  Problem Relation Age of Onset   Cancer Mother        breast   Cancer Sister        breast   Cancer Brother        stomach   Cancer Maternal Aunt        breast    Past Surgical History:  Procedure Laterality Date   ANTERIOR CERVICAL DECOMP/DISCECTOMY FUSION N/A 11/16/2012   Procedure: ANTERIOR CERVICAL DECOMPRESSION/DISCECTOMY FUSION 2 LEVELS;  Surgeon: Charlie Pitter, MD;  Location: [facility];  Service: Neurosurgery;  Laterality: N/A;  Cervical four-five, cervical five-six anterior cervical decompression fusion with PEEK and plate    BICEPS TENDON REPAIR     right   BRAVO PH STUDY  03/10/2012   Procedure: BRAVO [place];  Surgeon: Arta Silence, MD;  Location: [facility];  Service: Endoscopy;  Laterality: N/A;   ESOPHAGOGASTRODUODENOSCOPY (EGD) WITH PROPOFOL  03/10/2012   Procedure: ESOPHAGOGASTRODUODENOSCOPY (EGD) WITH PROPOFOL;  Surgeon: Arta Silence, MD;  Location: [facility];  Service: Endoscopy;  Laterality: N/A;   EYE SURGERY Bilateral    lasik   FRACTURE SURGERY     left ankle- bursectomy   HEMORROIDECTOMY  1995   KNEE ARTHROSCOPY     right   LAPAROSCOPIC APPENDECTOMY N/A 04/25/2014   Procedure: APPENDECTOMY  LAPAROSCOPIC;  Surgeon: Rolm Bookbinder, MD;  Location: [facility];  Service: General;  Laterality: N/A;   [place]     right   TOTAL KNEE ARTHROPLASTY Right 12/03/2021   Procedure: RIGHT TOTAL KNEE ARTHROPLASTY;  Surgeon: Meredith Pel, MD;  Location: [facility];  Service: Orthopedics;  Laterality: Right;   UMBILICAL HERNIA REPAIR N/A 04/25/2014   Procedure: HERNIA REPAIR UMBILICAL ADULT (PRIMARY);  Surgeon: Rolm Bookbinder, MD;  Location: [facility];  Service: General;  Laterality: N/A;   VASECTOMY     Social History   Occupational History   Not on file  Tobacco Use   Smoking status: Never   Smokeless tobacco: Never  Vaping Use   Vaping Use: Never used  Substance and Sexual Activity   Alcohol use: Yes    Alcohol/week: 4.0 - 8.0 standard drinks of alcohol    Types: 4 - 8 Cans of beer per week    Comment: 1-2 beers 4 nights/week   Drug use: No   Sexual activity: Yes

## 2021-12-19 NOTE — Discharge Summary (Signed)
Physician Discharge Summary      Patient ID: Steven Conley MRN: [id number] DOB/AGE: Dec 24, 1960 61 y.o.  Admit date: 12/03/2021 Discharge date: 12/05/2021  Admission Diagnoses:  Principal Problem:   S/P total knee arthroplasty, right Active Problems:   Arthritis of right knee   Discharge Diagnoses:  Same  Surgeries: Procedure(s): RIGHT TOTAL KNEE ARTHROPLASTY on 12/03/2021   Consultants:   Discharged Condition: Stable  Hospital Course: Steven Conley is an 61 y.o. male who was admitted 12/03/2021 with a chief complaint of right knee pain, and found to have a diagnosis of right knee osteoarthritis.  They were brought to the operating room on 12/03/2021 and underwent the above named procedures.  Pt awoke from anesthesia without complication and was transferred to the floor. On POD1, patient's pain was overall controlled and he is able to ambulate well with physical therapy, walking 300 feet and ascending 13 stairs.  His Coumadin was restarted and he had Lovenox bridge to prevent DVT/PE with his history of factor V Leiden and previous DVT.  No red flag symptoms throughout his stay.  He was discharged home on POD 2..  Pt will f/u with Dr. Marlou Sa in clinic in ~2 weeks.   Antibiotics given:  Anti-infectives (From admission, onward)    Start     Dose/Rate Route Frequency Ordered Stop   12/03/21 1400  ceFAZolin (ANCEF) IVPB 2g/100 mL premix        2 g 200 mL/hr over 30 Minutes Intravenous Every 8 hours 12/03/21 1133 12/03/21 2155   12/03/21 0817  vancomycin (VANCOCIN) powder  Status:  Discontinued          As needed 12/03/21 0818 12/03/21 1018   12/03/21 0615  ceFAZolin (ANCEF) IVPB 2g/100 mL premix        2 g 200 mL/hr over 30 Minutes Intravenous  Once 12/03/21 0608 12/03/21 0807   12/03/21 0615  ceFAZolin (ANCEF) 2-4 GM/100ML-% IVPB       Note to Pharmacy: Nyoka Cowden D: cabinet override      12/03/21 0615 12/03/21 0737     .  Recent vital signs:  Vitals:   12/04/21 2223  12/05/21 0616  BP: (!) 158/99 129/88  Pulse: 76 68  Resp: 18 16  Temp: 98.5 F (36.9 C)   SpO2: 97% 96%    Recent laboratory studies:  Results for orders placed or performed during the hospital encounter of 12/03/21  PT-INR Day of Surgery per protocol  Result Value Ref Range   Prothrombin Time 14.7 11.4 - 15.2 seconds   INR 1.2 0.8 - 1.2  PTT Day of Surgery per protocol  Result Value Ref Range   aPTT 32 24 - 36 seconds  CBC  Result Value Ref Range   WBC 7.1 4.0 - 10.5 K/uL   RBC 5.19 4.22 - 5.81 MIL/uL   Hemoglobin 16.5 13.0 - 17.0 g/dL   HCT 47.3 39.0 - 52.0 %   MCV 91.1 80.0 - 100.0 fL   MCH 31.8 26.0 - 34.0 pg   MCHC 34.9 30.0 - 36.0 g/dL   RDW 13.4 11.5 - 15.5 %   Platelets 136 (L) 150 - 400 K/uL   nRBC 0.0 0.0 - 0.2 %  Basic metabolic panel  Result Value Ref Range   Sodium 138 135 - 145 mmol/L   Potassium 3.8 3.5 - 5.1 mmol/L   Chloride 105 98 - 111 mmol/L   CO2 24 22 - 32 mmol/L   Glucose, Bld 103 (H) 70 -  as: Oxy IR/ROXICODONE Take 1-2 tablets (5-10 mg total) by mouth every 4 (four) hours as needed for severe pain.   Systane  Complete 0.6 % Soln Generic drug: Propylene Glycol Place 1 drop into both eyes daily as needed (dry eyes).   testosterone 50 MG/5GM (1%) Gel Commonly known as: ANDROGEL Place 5 g onto the skin daily.   valACYclovir 1000 MG tablet Commonly known as: VALTREX Take 1,000 mg by mouth daily.   warfarin 5 MG tablet Commonly known as: COUMADIN Take 2.5-5 mg by mouth See admin instructions. Per patient taking 5 mg on Monday's and taking 2.5 mg Tues.,Wed., Thurs., Fri., Sat., and Sun.        Diagnostic Studies: XR Knee 1-2 Views Right  Result Date: 12/18/2021 AP lateral merchant radiographs right knee reviewed.  Total knee prosthesis in good position alignment with no complicating features.  Patella height normal relative to distal femur.  VAS [place] LOWER EXTREMITY VENOUS (DVT)  Result Date: 12/09/2021  Lower Venous DVT Study Patient Name:  Steven Conley [facility]  Date of Exam:   12/09/2021 Medical Rec #: [id number]           Accession #:    [id number] Date of Birth: Sep 15, 1960            Patient Gender: M Patient Age:   14 years Exam Location:  [facility] Procedure:      VAS [place] LOWER EXTREMITY VENOUS (DVT) Referring Phys: Marcene Duos --------------------------------------------------------------------------------  Indications: Swelling, ecchymosis, pain s/p right total knee arthroplasty.  Risk Factors: Factor V. Anticoagulation: Coumadin. Bridged with Lovenox for surgery. Limitations: Subcutaneous edema overlying vessels; tissue properties. Comparison Study: No recent prior studies. Remote history of extensive DVT.                   03-09-2014 right lower extremity venous study showed evidence                   of chronic DVT involving the femoral and popliteal veins. Performing Technologist: Darlin Coco RDMS, RVT  Examination Guidelines: A complete evaluation includes B-mode imaging, spectral Doppler, color Doppler, and power Doppler as needed of all accessible portions of each vessel. Bilateral  testing is considered an integral part of a complete examination. Limited examinations for reoccurring indications may be performed as noted. The reflux portion of the exam is performed with the patient in reverse Trendelenburg.  +---------+---------------+---------+-----------+----------+-------------------+ RIGHT    CompressibilityPhasicitySpontaneityPropertiesThrombus Aging      +---------+---------------+---------+-----------+----------+-------------------+ CFV      Full           Yes      Yes                                      +---------+---------------+---------+-----------+----------+-------------------+ SFJ      Full                                                             +---------+---------------+---------+-----------+----------+-------------------+ FV Prox  Partial        Yes      Yes                  Chronic             +---------+---------------+---------+-----------+----------+-------------------+  Physician Discharge Summary      Patient ID: Steven Conley MRN: [id number] DOB/AGE: Dec 24, 1960 61 y.o.  Admit date: 12/03/2021 Discharge date: 12/05/2021  Admission Diagnoses:  Principal Problem:   S/P total knee arthroplasty, right Active Problems:   Arthritis of right knee   Discharge Diagnoses:  Same  Surgeries: Procedure(s): RIGHT TOTAL KNEE ARTHROPLASTY on 12/03/2021   Consultants:   Discharged Condition: Stable  Hospital Course: Steven Conley is an 61 y.o. male who was admitted 12/03/2021 with a chief complaint of right knee pain, and found to have a diagnosis of right knee osteoarthritis.  They were brought to the operating room on 12/03/2021 and underwent the above named procedures.  Pt awoke from anesthesia without complication and was transferred to the floor. On POD1, patient's pain was overall controlled and he is able to ambulate well with physical therapy, walking 300 feet and ascending 13 stairs.  His Coumadin was restarted and he had Lovenox bridge to prevent DVT/PE with his history of factor V Leiden and previous DVT.  No red flag symptoms throughout his stay.  He was discharged home on POD 2..  Pt will f/u with Dr. Marlou Sa in clinic in ~2 weeks.   Antibiotics given:  Anti-infectives (From admission, onward)    Start     Dose/Rate Route Frequency Ordered Stop   12/03/21 1400  ceFAZolin (ANCEF) IVPB 2g/100 mL premix        2 g 200 mL/hr over 30 Minutes Intravenous Every 8 hours 12/03/21 1133 12/03/21 2155   12/03/21 0817  vancomycin (VANCOCIN) powder  Status:  Discontinued          As needed 12/03/21 0818 12/03/21 1018   12/03/21 0615  ceFAZolin (ANCEF) IVPB 2g/100 mL premix        2 g 200 mL/hr over 30 Minutes Intravenous  Once 12/03/21 0608 12/03/21 0807   12/03/21 0615  ceFAZolin (ANCEF) 2-4 GM/100ML-% IVPB       Note to Pharmacy: Nyoka Cowden D: cabinet override      12/03/21 0615 12/03/21 0737     .  Recent vital signs:  Vitals:   12/04/21 2223  12/05/21 0616  BP: (!) 158/99 129/88  Pulse: 76 68  Resp: 18 16  Temp: 98.5 F (36.9 C)   SpO2: 97% 96%    Recent laboratory studies:  Results for orders placed or performed during the hospital encounter of 12/03/21  PT-INR Day of Surgery per protocol  Result Value Ref Range   Prothrombin Time 14.7 11.4 - 15.2 seconds   INR 1.2 0.8 - 1.2  PTT Day of Surgery per protocol  Result Value Ref Range   aPTT 32 24 - 36 seconds  CBC  Result Value Ref Range   WBC 7.1 4.0 - 10.5 K/uL   RBC 5.19 4.22 - 5.81 MIL/uL   Hemoglobin 16.5 13.0 - 17.0 g/dL   HCT 47.3 39.0 - 52.0 %   MCV 91.1 80.0 - 100.0 fL   MCH 31.8 26.0 - 34.0 pg   MCHC 34.9 30.0 - 36.0 g/dL   RDW 13.4 11.5 - 15.5 %   Platelets 136 (L) 150 - 400 K/uL   nRBC 0.0 0.0 - 0.2 %  Basic metabolic panel  Result Value Ref Range   Sodium 138 135 - 145 mmol/L   Potassium 3.8 3.5 - 5.1 mmol/L   Chloride 105 98 - 111 mmol/L   CO2 24 22 - 32 mmol/L   Glucose, Bld 103 (H) 70 -  Physician Discharge Summary      Patient ID: Steven Conley MRN: [id number] DOB/AGE: Dec 24, 1960 61 y.o.  Admit date: 12/03/2021 Discharge date: 12/05/2021  Admission Diagnoses:  Principal Problem:   S/P total knee arthroplasty, right Active Problems:   Arthritis of right knee   Discharge Diagnoses:  Same  Surgeries: Procedure(s): RIGHT TOTAL KNEE ARTHROPLASTY on 12/03/2021   Consultants:   Discharged Condition: Stable  Hospital Course: Steven Conley is an 61 y.o. male who was admitted 12/03/2021 with a chief complaint of right knee pain, and found to have a diagnosis of right knee osteoarthritis.  They were brought to the operating room on 12/03/2021 and underwent the above named procedures.  Pt awoke from anesthesia without complication and was transferred to the floor. On POD1, patient's pain was overall controlled and he is able to ambulate well with physical therapy, walking 300 feet and ascending 13 stairs.  His Coumadin was restarted and he had Lovenox bridge to prevent DVT/PE with his history of factor V Leiden and previous DVT.  No red flag symptoms throughout his stay.  He was discharged home on POD 2..  Pt will f/u with Dr. Marlou Sa in clinic in ~2 weeks.   Antibiotics given:  Anti-infectives (From admission, onward)    Start     Dose/Rate Route Frequency Ordered Stop   12/03/21 1400  ceFAZolin (ANCEF) IVPB 2g/100 mL premix        2 g 200 mL/hr over 30 Minutes Intravenous Every 8 hours 12/03/21 1133 12/03/21 2155   12/03/21 0817  vancomycin (VANCOCIN) powder  Status:  Discontinued          As needed 12/03/21 0818 12/03/21 1018   12/03/21 0615  ceFAZolin (ANCEF) IVPB 2g/100 mL premix        2 g 200 mL/hr over 30 Minutes Intravenous  Once 12/03/21 0608 12/03/21 0807   12/03/21 0615  ceFAZolin (ANCEF) 2-4 GM/100ML-% IVPB       Note to Pharmacy: Nyoka Cowden D: cabinet override      12/03/21 0615 12/03/21 0737     .  Recent vital signs:  Vitals:   12/04/21 2223  12/05/21 0616  BP: (!) 158/99 129/88  Pulse: 76 68  Resp: 18 16  Temp: 98.5 F (36.9 C)   SpO2: 97% 96%    Recent laboratory studies:  Results for orders placed or performed during the hospital encounter of 12/03/21  PT-INR Day of Surgery per protocol  Result Value Ref Range   Prothrombin Time 14.7 11.4 - 15.2 seconds   INR 1.2 0.8 - 1.2  PTT Day of Surgery per protocol  Result Value Ref Range   aPTT 32 24 - 36 seconds  CBC  Result Value Ref Range   WBC 7.1 4.0 - 10.5 K/uL   RBC 5.19 4.22 - 5.81 MIL/uL   Hemoglobin 16.5 13.0 - 17.0 g/dL   HCT 47.3 39.0 - 52.0 %   MCV 91.1 80.0 - 100.0 fL   MCH 31.8 26.0 - 34.0 pg   MCHC 34.9 30.0 - 36.0 g/dL   RDW 13.4 11.5 - 15.5 %   Platelets 136 (L) 150 - 400 K/uL   nRBC 0.0 0.0 - 0.2 %  Basic metabolic panel  Result Value Ref Range   Sodium 138 135 - 145 mmol/L   Potassium 3.8 3.5 - 5.1 mmol/L   Chloride 105 98 - 111 mmol/L   CO2 24 22 - 32 mmol/L   Glucose, Bld 103 (H) 70 -  as: Oxy IR/ROXICODONE Take 1-2 tablets (5-10 mg total) by mouth every 4 (four) hours as needed for severe pain.   Systane  Complete 0.6 % Soln Generic drug: Propylene Glycol Place 1 drop into both eyes daily as needed (dry eyes).   testosterone 50 MG/5GM (1%) Gel Commonly known as: ANDROGEL Place 5 g onto the skin daily.   valACYclovir 1000 MG tablet Commonly known as: VALTREX Take 1,000 mg by mouth daily.   warfarin 5 MG tablet Commonly known as: COUMADIN Take 2.5-5 mg by mouth See admin instructions. Per patient taking 5 mg on Monday's and taking 2.5 mg Tues.,Wed., Thurs., Fri., Sat., and Sun.        Diagnostic Studies: XR Knee 1-2 Views Right  Result Date: 12/18/2021 AP lateral merchant radiographs right knee reviewed.  Total knee prosthesis in good position alignment with no complicating features.  Patella height normal relative to distal femur.  VAS [place] LOWER EXTREMITY VENOUS (DVT)  Result Date: 12/09/2021  Lower Venous DVT Study Patient Name:  Steven Conley [facility]  Date of Exam:   12/09/2021 Medical Rec #: [id number]           Accession #:    [id number] Date of Birth: Sep 15, 1960            Patient Gender: M Patient Age:   14 years Exam Location:  [facility] Procedure:      VAS [place] LOWER EXTREMITY VENOUS (DVT) Referring Phys: Marcene Duos --------------------------------------------------------------------------------  Indications: Swelling, ecchymosis, pain s/p right total knee arthroplasty.  Risk Factors: Factor V. Anticoagulation: Coumadin. Bridged with Lovenox for surgery. Limitations: Subcutaneous edema overlying vessels; tissue properties. Comparison Study: No recent prior studies. Remote history of extensive DVT.                   03-09-2014 right lower extremity venous study showed evidence                   of chronic DVT involving the femoral and popliteal veins. Performing Technologist: Darlin Coco RDMS, RVT  Examination Guidelines: A complete evaluation includes B-mode imaging, spectral Doppler, color Doppler, and power Doppler as needed of all accessible portions of each vessel. Bilateral  testing is considered an integral part of a complete examination. Limited examinations for reoccurring indications may be performed as noted. The reflux portion of the exam is performed with the patient in reverse Trendelenburg.  +---------+---------------+---------+-----------+----------+-------------------+ RIGHT    CompressibilityPhasicitySpontaneityPropertiesThrombus Aging      +---------+---------------+---------+-----------+----------+-------------------+ CFV      Full           Yes      Yes                                      +---------+---------------+---------+-----------+----------+-------------------+ SFJ      Full                                                             +---------+---------------+---------+-----------+----------+-------------------+ FV Prox  Partial        Yes      Yes                  Chronic             +---------+---------------+---------+-----------+----------+-------------------+  as: Oxy IR/ROXICODONE Take 1-2 tablets (5-10 mg total) by mouth every 4 (four) hours as needed for severe pain.   Systane  Complete 0.6 % Soln Generic drug: Propylene Glycol Place 1 drop into both eyes daily as needed (dry eyes).   testosterone 50 MG/5GM (1%) Gel Commonly known as: ANDROGEL Place 5 g onto the skin daily.   valACYclovir 1000 MG tablet Commonly known as: VALTREX Take 1,000 mg by mouth daily.   warfarin 5 MG tablet Commonly known as: COUMADIN Take 2.5-5 mg by mouth See admin instructions. Per patient taking 5 mg on Monday's and taking 2.5 mg Tues.,Wed., Thurs., Fri., Sat., and Sun.        Diagnostic Studies: XR Knee 1-2 Views Right  Result Date: 12/18/2021 AP lateral merchant radiographs right knee reviewed.  Total knee prosthesis in good position alignment with no complicating features.  Patella height normal relative to distal femur.  VAS [place] LOWER EXTREMITY VENOUS (DVT)  Result Date: 12/09/2021  Lower Venous DVT Study Patient Name:  Steven Conley [facility]  Date of Exam:   12/09/2021 Medical Rec #: [id number]           Accession #:    [id number] Date of Birth: Sep 15, 1960            Patient Gender: M Patient Age:   14 years Exam Location:  [facility] Procedure:      VAS [place] LOWER EXTREMITY VENOUS (DVT) Referring Phys: Marcene Duos --------------------------------------------------------------------------------  Indications: Swelling, ecchymosis, pain s/p right total knee arthroplasty.  Risk Factors: Factor V. Anticoagulation: Coumadin. Bridged with Lovenox for surgery. Limitations: Subcutaneous edema overlying vessels; tissue properties. Comparison Study: No recent prior studies. Remote history of extensive DVT.                   03-09-2014 right lower extremity venous study showed evidence                   of chronic DVT involving the femoral and popliteal veins. Performing Technologist: Darlin Coco RDMS, RVT  Examination Guidelines: A complete evaluation includes B-mode imaging, spectral Doppler, color Doppler, and power Doppler as needed of all accessible portions of each vessel. Bilateral  testing is considered an integral part of a complete examination. Limited examinations for reoccurring indications may be performed as noted. The reflux portion of the exam is performed with the patient in reverse Trendelenburg.  +---------+---------------+---------+-----------+----------+-------------------+ RIGHT    CompressibilityPhasicitySpontaneityPropertiesThrombus Aging      +---------+---------------+---------+-----------+----------+-------------------+ CFV      Full           Yes      Yes                                      +---------+---------------+---------+-----------+----------+-------------------+ SFJ      Full                                                             +---------+---------------+---------+-----------+----------+-------------------+ FV Prox  Partial        Yes      Yes                  Chronic             +---------+---------------+---------+-----------+----------+-------------------+  as: Oxy IR/ROXICODONE Take 1-2 tablets (5-10 mg total) by mouth every 4 (four) hours as needed for severe pain.   Systane  Complete 0.6 % Soln Generic drug: Propylene Glycol Place 1 drop into both eyes daily as needed (dry eyes).   testosterone 50 MG/5GM (1%) Gel Commonly known as: ANDROGEL Place 5 g onto the skin daily.   valACYclovir 1000 MG tablet Commonly known as: VALTREX Take 1,000 mg by mouth daily.   warfarin 5 MG tablet Commonly known as: COUMADIN Take 2.5-5 mg by mouth See admin instructions. Per patient taking 5 mg on Monday's and taking 2.5 mg Tues.,Wed., Thurs., Fri., Sat., and Sun.        Diagnostic Studies: XR Knee 1-2 Views Right  Result Date: 12/18/2021 AP lateral merchant radiographs right knee reviewed.  Total knee prosthesis in good position alignment with no complicating features.  Patella height normal relative to distal femur.  VAS [place] LOWER EXTREMITY VENOUS (DVT)  Result Date: 12/09/2021  Lower Venous DVT Study Patient Name:  Steven Conley [facility]  Date of Exam:   12/09/2021 Medical Rec #: [id number]           Accession #:    [id number] Date of Birth: Sep 15, 1960            Patient Gender: M Patient Age:   14 years Exam Location:  [facility] Procedure:      VAS [place] LOWER EXTREMITY VENOUS (DVT) Referring Phys: Marcene Duos --------------------------------------------------------------------------------  Indications: Swelling, ecchymosis, pain s/p right total knee arthroplasty.  Risk Factors: Factor V. Anticoagulation: Coumadin. Bridged with Lovenox for surgery. Limitations: Subcutaneous edema overlying vessels; tissue properties. Comparison Study: No recent prior studies. Remote history of extensive DVT.                   03-09-2014 right lower extremity venous study showed evidence                   of chronic DVT involving the femoral and popliteal veins. Performing Technologist: Darlin Coco RDMS, RVT  Examination Guidelines: A complete evaluation includes B-mode imaging, spectral Doppler, color Doppler, and power Doppler as needed of all accessible portions of each vessel. Bilateral  testing is considered an integral part of a complete examination. Limited examinations for reoccurring indications may be performed as noted. The reflux portion of the exam is performed with the patient in reverse Trendelenburg.  +---------+---------------+---------+-----------+----------+-------------------+ RIGHT    CompressibilityPhasicitySpontaneityPropertiesThrombus Aging      +---------+---------------+---------+-----------+----------+-------------------+ CFV      Full           Yes      Yes                                      +---------+---------------+---------+-----------+----------+-------------------+ SFJ      Full                                                             +---------+---------------+---------+-----------+----------+-------------------+ FV Prox  Partial        Yes      Yes                  Chronic             +---------+---------------+---------+-----------+----------+-------------------+  as: Oxy IR/ROXICODONE Take 1-2 tablets (5-10 mg total) by mouth every 4 (four) hours as needed for severe pain.   Systane  Complete 0.6 % Soln Generic drug: Propylene Glycol Place 1 drop into both eyes daily as needed (dry eyes).   testosterone 50 MG/5GM (1%) Gel Commonly known as: ANDROGEL Place 5 g onto the skin daily.   valACYclovir 1000 MG tablet Commonly known as: VALTREX Take 1,000 mg by mouth daily.   warfarin 5 MG tablet Commonly known as: COUMADIN Take 2.5-5 mg by mouth See admin instructions. Per patient taking 5 mg on Monday's and taking 2.5 mg Tues.,Wed., Thurs., Fri., Sat., and Sun.        Diagnostic Studies: XR Knee 1-2 Views Right  Result Date: 12/18/2021 AP lateral merchant radiographs right knee reviewed.  Total knee prosthesis in good position alignment with no complicating features.  Patella height normal relative to distal femur.  VAS [place] LOWER EXTREMITY VENOUS (DVT)  Result Date: 12/09/2021  Lower Venous DVT Study Patient Name:  Steven Conley [facility]  Date of Exam:   12/09/2021 Medical Rec #: [id number]           Accession #:    [id number] Date of Birth: Sep 15, 1960            Patient Gender: M Patient Age:   14 years Exam Location:  [facility] Procedure:      VAS [place] LOWER EXTREMITY VENOUS (DVT) Referring Phys: Marcene Duos --------------------------------------------------------------------------------  Indications: Swelling, ecchymosis, pain s/p right total knee arthroplasty.  Risk Factors: Factor V. Anticoagulation: Coumadin. Bridged with Lovenox for surgery. Limitations: Subcutaneous edema overlying vessels; tissue properties. Comparison Study: No recent prior studies. Remote history of extensive DVT.                   03-09-2014 right lower extremity venous study showed evidence                   of chronic DVT involving the femoral and popliteal veins. Performing Technologist: Darlin Coco RDMS, RVT  Examination Guidelines: A complete evaluation includes B-mode imaging, spectral Doppler, color Doppler, and power Doppler as needed of all accessible portions of each vessel. Bilateral  testing is considered an integral part of a complete examination. Limited examinations for reoccurring indications may be performed as noted. The reflux portion of the exam is performed with the patient in reverse Trendelenburg.  +---------+---------------+---------+-----------+----------+-------------------+ RIGHT    CompressibilityPhasicitySpontaneityPropertiesThrombus Aging      +---------+---------------+---------+-----------+----------+-------------------+ CFV      Full           Yes      Yes                                      +---------+---------------+---------+-----------+----------+-------------------+ SFJ      Full                                                             +---------+---------------+---------+-----------+----------+-------------------+ FV Prox  Partial        Yes      Yes                  Chronic             +---------+---------------+---------+-----------+----------+-------------------+  as: Oxy IR/ROXICODONE Take 1-2 tablets (5-10 mg total) by mouth every 4 (four) hours as needed for severe pain.   Systane  Complete 0.6 % Soln Generic drug: Propylene Glycol Place 1 drop into both eyes daily as needed (dry eyes).   testosterone 50 MG/5GM (1%) Gel Commonly known as: ANDROGEL Place 5 g onto the skin daily.   valACYclovir 1000 MG tablet Commonly known as: VALTREX Take 1,000 mg by mouth daily.   warfarin 5 MG tablet Commonly known as: COUMADIN Take 2.5-5 mg by mouth See admin instructions. Per patient taking 5 mg on Monday's and taking 2.5 mg Tues.,Wed., Thurs., Fri., Sat., and Sun.        Diagnostic Studies: XR Knee 1-2 Views Right  Result Date: 12/18/2021 AP lateral merchant radiographs right knee reviewed.  Total knee prosthesis in good position alignment with no complicating features.  Patella height normal relative to distal femur.  VAS [place] LOWER EXTREMITY VENOUS (DVT)  Result Date: 12/09/2021  Lower Venous DVT Study Patient Name:  Steven Conley [facility]  Date of Exam:   12/09/2021 Medical Rec #: [id number]           Accession #:    [id number] Date of Birth: Sep 15, 1960            Patient Gender: M Patient Age:   14 years Exam Location:  [facility] Procedure:      VAS [place] LOWER EXTREMITY VENOUS (DVT) Referring Phys: Marcene Duos --------------------------------------------------------------------------------  Indications: Swelling, ecchymosis, pain s/p right total knee arthroplasty.  Risk Factors: Factor V. Anticoagulation: Coumadin. Bridged with Lovenox for surgery. Limitations: Subcutaneous edema overlying vessels; tissue properties. Comparison Study: No recent prior studies. Remote history of extensive DVT.                   03-09-2014 right lower extremity venous study showed evidence                   of chronic DVT involving the femoral and popliteal veins. Performing Technologist: Darlin Coco RDMS, RVT  Examination Guidelines: A complete evaluation includes B-mode imaging, spectral Doppler, color Doppler, and power Doppler as needed of all accessible portions of each vessel. Bilateral  testing is considered an integral part of a complete examination. Limited examinations for reoccurring indications may be performed as noted. The reflux portion of the exam is performed with the patient in reverse Trendelenburg.  +---------+---------------+---------+-----------+----------+-------------------+ RIGHT    CompressibilityPhasicitySpontaneityPropertiesThrombus Aging      +---------+---------------+---------+-----------+----------+-------------------+ CFV      Full           Yes      Yes                                      +---------+---------------+---------+-----------+----------+-------------------+ SFJ      Full                                                             +---------+---------------+---------+-----------+----------+-------------------+ FV Prox  Partial        Yes      Yes                  Chronic             +---------+---------------+---------+-----------+----------+-------------------+

## 2021-12-26 ENCOUNTER — Other Ambulatory Visit: Payer: Self-pay | Admitting: Orthopedic Surgery

## 2021-12-26 ENCOUNTER — Telehealth: Payer: Self-pay | Admitting: Orthopedic Surgery

## 2021-12-26 ENCOUNTER — Ambulatory Visit: Payer: 59 | Admitting: Neurology

## 2021-12-26 MED ORDER — OXYCODONE HCL 5 MG PO TABS
5.0000 mg | ORAL_TABLET | Freq: Three times a day (TID) | ORAL | 0 refills | Status: DC | PRN
Start: 2021-12-26 — End: 2022-01-03

## 2021-12-26 NOTE — Telephone Encounter (Signed)
Pt wife called asking for an update. Pt is out. He only has 2 tablets left   Cb [phone]

## 2021-12-26 NOTE — Telephone Encounter (Signed)
Tried calling to advise D rDean and Luke in surgery but they have been notified of request. No answer and unable to LM as VM is full.

## 2021-12-26 NOTE — Telephone Encounter (Signed)
Tried calling to advise per Runell Gess message below. No answer and unable to LM as VM is full.

## 2021-12-26 NOTE — Telephone Encounter (Signed)
Spoke with patient he advised needing Rx refilled Oxycodone 5 mg. Patient said he will be out of his pain medication at 2:00 pm today.     Patient asked that the Rx be sent to [facility] at [address]     The number to contact patient is [phone]

## 2021-12-26 NOTE — Telephone Encounter (Signed)
I sent in a refill.  Recommend he give [place] 24 hour notice next time. Decreased frquency to taking q8h prn

## 2021-12-27 NOTE — Progress Notes (Unsigned)
Assessment/Plan:   ***  Subjective:   Steven Conley was seen today in the movement disorders clinic for neurologic consultation at the request of Lavone Orn, MD.  The consultation is for the evaluation of tremor in bilateral UE and L foot and to r/o PD.  Outside records that were made available to me were reviewed.  Tremor: {yes no:314532}   How long has it been going on? ***  At rest or with activation?  ***  When is it noted the most?  ***  Fam hx of tremor?  {yes QM:578469}  Located where?  ***  Affected by caffeine:  {yes no:314532}  Affected by alcohol:  {yes no:314532}  Affected by stress:  {yes no:314532}  Affected by fatigue:  {yes no:314532}  Spills soup if on spoon:  {yes no:314532}  Spills glass of liquid if full:  {yes no:314532}  Affects ADL's (tying shoes, brushing teeth, etc):  {yes no:314532}  Tremor inducing meds:  {yes no:314532}  Other Specific Symptoms:  Voice: *** Sleep: ***  Vivid Dreams:  {yes no:314532}  Acting out dreams:  {yes no:314532} Wet Pillows: {yes no:314532} Postural symptoms:  {yes no:314532}  Falls?  {yes no:314532} Bradykinesia symptoms: {parkinson brady:18041} Loss of smell:  {yes no:314532} Loss of taste:  {yes no:314532} Urinary Incontinence:  {yes no:314532} Difficulty Swallowing:  {yes no:314532} Handwriting, micrographia: {yes no:314532} Trouble with ADL's:  {yes no:314532}  Trouble buttoning clothing: {yes no:314532} Depression:  {yes no:314532} Memory changes:  {yes no:314532} Hallucinations:  {yes no:314532}  visual distortions: {yes no:314532} N/V:  {yes no:314532} Lightheaded:  {yes no:314532}  Syncope: {yes no:314532} Diplopia:  {yes no:314532} Dyskinesia:  {yes no:314532}  Neuroimaging of the brain has *** previously been performed.  It *** available for my review today.  PREVIOUS MEDICATIONS: {Parkinson's RX:18200}  ALLERGIES:   Allergies  Allergen Reactions   Amoxicillin Other (See Comments)    Pt  unsure of reaction    Dexilant [Dexlansoprazole] Other (See Comments)    Stomach issues    Lexapro [Escitalopram] Other (See Comments)    Stomach Issues    Sulfa Drugs Cross Reactors Hives   Zoloft [Sertraline Hcl] Other (See Comments)    Stomach issues     CURRENT MEDICATIONS:  Current Outpatient Medications  Medication Instructions   acetaminophen (TYLENOL) 650 mg, Oral, Every 6 hours PRN   albuterol (VENTOLIN HFA) 108 (90 Base) MCG/ACT inhaler 2 puffs, Inhalation, Every 6 hours PRN   atorvastatin (LIPITOR) 20 mg, Oral, Daily   cyanocobalamin (VITAMIN B12) 1,000 mcg, Oral, Daily   diltiazem (CARDIZEM CD) 300 mg, Oral, Daily   diltiazem (CARDIZEM) 30 mg, Oral, 4 times daily PRN   docusate sodium (COLACE) 100 mg, Oral, 2 times daily   doxylamine (Sleep) (UNISOM) 25 mg, Oral, At bedtime PRN   lisinopril (ZESTRIL) 10 mg, Oral, Daily   methocarbamol (ROBAXIN) 500 mg, Oral, Every 8 hours PRN   methocarbamol (ROBAXIN) 500 mg, Oral, Every 8 hours PRN   omeprazole (PRILOSEC) 40 mg, Daily   ondansetron (ZOFRAN) 4 mg, Oral, Every 8 hours PRN   oxyCODONE (ROXICODONE) 5 mg, Oral, Every 8 hours PRN   Propylene Glycol (SYSTANE COMPLETE) 0.6 % SOLN 1 drop, Both Eyes, Daily PRN   testosterone (ANDROGEL) 5 g, Transdermal, Daily   valACYclovir (VALTREX) 1,000 mg, Oral, Daily   warfarin (COUMADIN) 2.5-5 mg, Oral, See admin instructions, Per patient taking 5 mg on Monday's and taking 2.5 mg Tues.,Wed., Thurs., Fri., Sat., and Sun.     Objective:  PHYSICAL EXAMINATION:    VITALS:  There were no vitals filed for this visit.  GEN:  The patient appears stated age and is in NAD. HEENT:  Normocephalic, atraumatic.  The mucous membranes are moist. The superficial temporal arteries are without ropiness or tenderness. CV:  RRR Lungs:  CTAB Neck/HEME:  There are no carotid bruits bilaterally.  Neurological examination:  Orientation: The patient is alert and oriented x3.  Cranial nerves: There is  good facial symmetry.  Extraocular muscles are intact. The visual fields are full to confrontational testing. The speech is fluent and clear. Soft palate rises symmetrically and there is no tongue deviation. Hearing is intact to conversational tone. Sensation: Sensation is intact to light touch throughout (facial, trunk, extremities). Vibration is intact at the bilateral big toe. There is no extinction with double simultaneous stimulation.  Motor: Strength is 5/5 in the bilateral upper and lower extremities.   Shoulder shrug is equal and symmetric.  There is no pronator drift. Deep tendon reflexes: Deep tendon reflexes are 2/4 at the bilateral biceps, triceps, brachioradialis, patella and achilles. Plantar responses are downgoing bilaterally.  Movement examination: Tone: There is ***tone in the bilateral upper extremities.  The tone in the lower extremities is ***.  Abnormal movements: *** Coordination:  There is *** decremation with RAM's, *** Gait and Station: The patient has *** difficulty arising out of a deep-seated chair without the use of the hands. The patient's stride length is ***.  The patient has a *** pull test.     I have reviewed and interpreted the following labs independently   Chemistry      Component Value Date/Time   NA 138 12/03/2021 0636   NA 140 01/24/2020 1551   K 3.8 12/03/2021 0636   CL 105 12/03/2021 0636   CO2 24 12/03/2021 0636   BUN 11 12/03/2021 0636   BUN 16 01/24/2020 1551   CREATININE 1.10 12/03/2021 0636      Component Value Date/Time   CALCIUM 9.2 12/03/2021 0636   ALKPHOS 56 09/07/2012 1711   AST 38 (H) 09/07/2012 1711   ALT 93 (H) 09/07/2012 1711   BILITOT 0.6 09/07/2012 1711      Lab Results  Component Value Date   TSH 1.400 01/24/2020   Lab Results  Component Value Date   WBC 14.0 (H) 12/05/2021   HGB 14.5 12/05/2021   HCT 41.6 12/05/2021   MCV 90.8 12/05/2021   PLT 115 (L) 12/05/2021      Total time spent on today's visit was  ***greater than 60 minutes, including both face-to-face time and nonface-to-face time.  Time included that spent on review of records (prior notes available to me/labs/imaging if pertinent), discussing treatment and goals, answering patient's questions and coordinating care.  Cc:  Lavone Orn, MD

## 2021-12-31 ENCOUNTER — Ambulatory Visit: Payer: 59 | Admitting: Neurology

## 2021-12-31 ENCOUNTER — Encounter: Payer: Self-pay | Admitting: Neurology

## 2021-12-31 VITALS — BP 139/92 | HR 68 | Ht 69.0 in | Wt 185.0 lb

## 2021-12-31 DIAGNOSIS — R251 Tremor, unspecified: Secondary | ICD-10-CM | POA: Diagnosis not present

## 2021-12-31 DIAGNOSIS — G4752 REM sleep behavior disorder: Secondary | ICD-10-CM | POA: Diagnosis not present

## 2021-12-31 DIAGNOSIS — G4733 Obstructive sleep apnea (adult) (pediatric): Secondary | ICD-10-CM | POA: Diagnosis not present

## 2021-12-31 NOTE — Patient Instructions (Signed)
We discussed DaT scan.  We discussed that this is not a diagnostic scan, but will just give [place] some information on dopamine levels in the brain.  Here is some information which may be helpful to you.  Before the Exam  Please tell the nurse, nuclear imaging technician or nuclear medicine physician if you are pregnant, nursing or have reduced liver function. Please also inform us if you have an allergy or sensitivity to iodine.  The test may be completed with those who are allergic to iodine, but may require pre-medication with other medications to help avoid reactions. If you need to cancel the examination, please give [place] at least 24 hours notice.  Before your scan, stop taking these medicines for the length of time shown: Name of Drug Stop Taking  Amoxapine 4 days before  Benztropine  Cogentin 3 days before  Bupropion (Aplenzin, Budeprion, Voxra, Wellbutrin, Zyban) 48 hours before  Buspirone 15 hours before  Citalopram 24 hours before  Cocaine 6 hours before  Escitalopram 24 hours before  Methamphetamine 24 hours before  Methylphenidate (Concerta, Metadate, Methylin, Ritalin) 20 hours before  Paroxetine 24 hours before  Selegilene 48 hours before  Sertraline 3 days before    On the Day of the Exam Drink plenty of fluids and go to the bathroom frequently (and for two days after your exam) Wear loose comfortable clothing, since you will need to lie still for a period of time. Please bring a list of all medications that you are taking; name and dosage. We want to make your waiting time as pleasant as possible. Consider bringing your favorite magazine, book or music player to help you pass the time.  You do not need to stay at the imaging facility the entire time, between the initial injection and the scan itself.   Please leave your jewelry and valuables at home.  During the Exam The DaTscan once started takes approximately 30-45 minutes. However, following injection of the DaT agent  approximately 3-6 hours are required before the agent has achieved appropriate concentration in the brain.  We will inject the DaTscan through an intravenous (IV) line into your arm in the AM, usually around 8-9am, and then you will come back usually in the mid afternoon for the scan. Before the exam, you will receive a drug to allow you to protect the thyroid. For the imaging test, you will be asked to lie on a table and an imaging technologist will position your head in a headrest. A strip of tape or a flexible restraint may be placed around your head to help you to not move your head during the scan. A camera will be positioned above you and you must remain very still for about 30 minute while images are taken. The scanner will be very close to your head, but will not touch your head.

## 2022-01-03 ENCOUNTER — Other Ambulatory Visit: Payer: Self-pay | Admitting: Surgical

## 2022-01-03 MED ORDER — OXYCODONE HCL 5 MG PO TABS: 5.0000 mg | ORAL_TABLET | Freq: Three times a day (TID) | ORAL | 0 refills | Status: DC | PRN

## 2022-01-15 ENCOUNTER — Ambulatory Visit (INDEPENDENT_AMBULATORY_CARE_PROVIDER_SITE_OTHER): Payer: 59 | Admitting: Orthopedic Surgery

## 2022-01-15 DIAGNOSIS — Z96651 Presence of right artificial knee joint: Secondary | ICD-10-CM

## 2022-01-15 MED ORDER — HYDROCODONE-ACETAMINOPHEN 5-325 MG PO TABS: ORAL_TABLET | ORAL | 0 refills | Status: DC

## 2022-01-16 ENCOUNTER — Other Ambulatory Visit: Payer: Self-pay | Admitting: Orthopedic Surgery

## 2022-01-16 ENCOUNTER — Telehealth: Payer: Self-pay | Admitting: Orthopedic Surgery

## 2022-01-16 MED ORDER — HYDROCODONE-ACETAMINOPHEN 5-325 MG PO TABS: ORAL_TABLET | ORAL | 0 refills | Status: DC

## 2022-01-16 NOTE — Telephone Encounter (Signed)
Resent to [place]

## 2022-01-16 NOTE — Telephone Encounter (Signed)
Pt need scrpit from yesterday sent to [facility] [address]

## 2022-01-18 ENCOUNTER — Encounter: Payer: Self-pay | Admitting: Orthopedic Surgery

## 2022-01-18 NOTE — Progress Notes (Signed)
Post-Op Visit Note   Patient: Steven Conley           Date of Birth: 05/20/1960           MRN: [id number] Visit Date: 01/15/2022 PCP: Steven Orn, MD   Assessment & Plan:  Chief Complaint:  Chief Complaint  Patient presents with   Right Knee - Routine Post Op    12/03/21 right TKA   Visit Diagnoses: No diagnosis found.  Plan: Steven Conley is a 61 year old patient who is now about 6 weeks out right total knee replacement.  Having some stiffness and soreness.  INR 2.92 days ago.  Ambulating with a cane.  Taking Tylenol and oxycodone.  Doing home exercise program as well as outpatient therapy 2 times a week.  On examination he has range of motion 0-1 10 cold.  Mild right leg swelling which is significantly unchanged from prior visit.  He does have compression hose.  Negative Homans and no calf tenderness today.  Overall he has excellent patellar mobility as well.  Plan to switch him from oxycodone to Norco.  Start opioid taper.  6-week return for clinical recheck.  Follow-Up Instructions: No follow-ups on file.   Orders:  No orders of the defined types were placed in this encounter.  Meds ordered this encounter  Medications   DISCONTD: HYDROcodone-acetaminophen (NORCO/VICODIN) 5-325 MG tablet    Sig: 1 po q 12 hrs prn pain    Dispense:  35 tablet    Refill:  0    Imaging: No results found.  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   Ejection fraction    PAF (paroxysmal atrial fibrillation) ([place]) 01/09/2013   Warfarin anticoagulation 01/09/2013   Spondylosis, cervical, with myelopathy 11/16/2012   Factor V Leiden mutation ([place]) 09/07/2012   ARF (acute renal failure) ([place]) 09/07/2012   SOB (shortness of breath)  09/07/2012   Chest discomfort 09/07/2012   HTN (hypertension) 09/07/2012   GERD (gastroesophageal reflux disease) 09/07/2012   HLD (hyperlipidemia) 09/07/2012   History of DVT of lower extremity 09/07/2012   Internal hemorrhoids with complication 40/98/1191   Past Medical History:  Diagnosis Date   Blood dyscrasia    Cancer ([place])    squamous cell   Chronic kidney disease    acute renal failure no dialysis due to dehydration   Colon polyp    DVT (deep venous thrombosis) ([place])    Dysrhythmia    Ejection fraction    .   GERD (gastroesophageal reflux disease)    Hemorrhoid    Homozygous Factor V Leiden mutation ([place])    Dr. Lenna Sciara. [place],PCP-follows   Hyperlipidemia    Hypertension    not currently on BP medication   PAT (paroxysmal atrial tachycardia) (HCC)    PONV (postoperative nausea and vomiting)    no problems with Propofol use   Sleep apnea 2015   uses CPAP intermittently    Family History  Problem Relation Age of Onset   Cancer Mother 66       breast   Cancer Sister        breast   Cerebral aneurysm Sister    Cancer Brother        stomach   Cancer Maternal Aunt  breast    Past Surgical History:  Procedure Laterality Date   ANTERIOR CERVICAL DECOMP/DISCECTOMY FUSION N/A 11/16/2012   Procedure: ANTERIOR CERVICAL DECOMPRESSION/DISCECTOMY FUSION 2 LEVELS;  Surgeon: Charlie Pitter, MD;  Location: [facility];  Service: Neurosurgery;  Laterality: N/A;  Cervical four-five, cervical five-six anterior cervical decompression fusion with PEEK and plate    BICEPS TENDON REPAIR     right   BRAVO PH STUDY  03/10/2012   Procedure: BRAVO [place];  Surgeon: Arta Silence, MD;  Location: [facility];  Service: Endoscopy;  Laterality: N/A;   ESOPHAGOGASTRODUODENOSCOPY (EGD) WITH PROPOFOL  03/10/2012   Procedure: ESOPHAGOGASTRODUODENOSCOPY (EGD) WITH PROPOFOL;  Surgeon: Arta Silence, MD;  Location: [facility];  Service: Endoscopy;  Laterality: N/A;   EYE SURGERY  Bilateral    lasik   FRACTURE SURGERY     left ankle- bursectomy   HEMORROIDECTOMY  1995   KNEE ARTHROSCOPY     right   LAPAROSCOPIC APPENDECTOMY N/A 04/25/2014   Procedure: APPENDECTOMY LAPAROSCOPIC;  Surgeon: Rolm Bookbinder, MD;  Location: [place];  Service: General;  Laterality: N/A;   [place]     right   TOTAL KNEE ARTHROPLASTY Right 12/03/2021   Procedure: RIGHT TOTAL KNEE ARTHROPLASTY;  Surgeon: Meredith Pel, MD;  Location: [place];  Service: Orthopedics;  Laterality: Right;   UMBILICAL HERNIA REPAIR N/A 04/25/2014   Procedure: HERNIA REPAIR UMBILICAL ADULT (PRIMARY);  Surgeon: Rolm Bookbinder, MD;  Location: [facility];  Service: General;  Laterality: N/A;   VASECTOMY     Social History   Occupational History   Not on file  Tobacco Use   Smoking status: Never   Smokeless tobacco: Never  Vaping Use   Vaping Use: Never used  Substance and Sexual Activity   Alcohol use: Yes    Alcohol/week: 4.0 - 8.0 standard drinks of alcohol    Types: 4 - 8 Cans of beer per week    Comment: heavy on weekend   Drug use: No   Sexual activity: Yes

## 2022-01-30 ENCOUNTER — Ambulatory Visit: Payer: 59 | Admitting: Neurology

## 2022-02-26 ENCOUNTER — Ambulatory Visit (INDEPENDENT_AMBULATORY_CARE_PROVIDER_SITE_OTHER): Payer: 59 | Admitting: Orthopedic Surgery

## 2022-02-26 DIAGNOSIS — Z96651 Presence of right artificial knee joint: Secondary | ICD-10-CM

## 2022-02-27 ENCOUNTER — Encounter: Payer: Self-pay | Admitting: Orthopedic Surgery

## 2022-02-27 NOTE — Progress Notes (Signed)
Post-Op Visit Note   Patient: Steven Conley           Date of Birth: 1960/11/05           MRN: [id number] Visit Date: 02/26/2022 PCP: Lavone Orn, MD   Assessment & Plan:  Chief Complaint:  Chief Complaint  Patient presents with   Right Knee - Routine Post Op    12/03/21 right TKA   Visit Diagnoses:  1. S/P total knee arthroplasty, right     Plan: Patient is now 3 months out right total knee replacement.  Doing therapy 1 time every 2 weeks.  Doing stationary bike as well as home exercise program.  On exam he has range of motion 0-1 20.  Quad strength improving.  Does have chronic swelling in the right lower extremity consistent with his known diagnosis of chronic thromboembolic disease.  He would like to have evaluation by vascular surgeon about procedural options for this problem.  I am not sure there are any.  Would not want him undergoing any elective procedures on that right leg for at least 3 more months.  Follow-up with me in 3 months for final check.  Follow-Up Instructions: No follow-ups on file.   Orders:  Orders Placed This Encounter  Procedures   Ambulatory referral to Vascular Surgery   No orders of the defined types were placed in this encounter.   Imaging: No results found.  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   Ejection fraction    PAF (paroxysmal atrial fibrillation) ([place]) 01/09/2013   Warfarin anticoagulation 01/09/2013   Spondylosis, cervical, with myelopathy 11/16/2012   Factor V Leiden mutation ([place]) 09/07/2012   ARF (acute renal failure) ([place]) 09/07/2012   SOB (shortness of breath) 09/07/2012   Chest discomfort 09/07/2012   HTN (hypertension) 09/07/2012   GERD (gastroesophageal reflux  disease) 09/07/2012   HLD (hyperlipidemia) 09/07/2012   History of DVT of lower extremity 09/07/2012   Internal hemorrhoids with complication 66/59/9357   Past Medical History:  Diagnosis Date   Blood dyscrasia    Cancer ([place])    squamous cell   Chronic kidney disease    acute renal failure no dialysis due to dehydration   Colon polyp    DVT (deep venous thrombosis) ([place])    Dysrhythmia    Ejection fraction    .   GERD (gastroesophageal reflux disease)    Hemorrhoid    Homozygous Factor V Leiden mutation ([place])    Dr. Lenna Sciara. [place],PCP-follows   Hyperlipidemia    Hypertension    not currently on BP medication   PAT (paroxysmal atrial tachycardia) (HCC)    PONV (postoperative nausea and vomiting)    no problems with Propofol use   Sleep apnea 2015   uses CPAP intermittently    Family History  Problem Relation Age of Onset   Cancer Mother 40       breast   Cancer Sister        breast   Cerebral aneurysm Sister    Cancer Brother        stomach   Cancer Maternal Aunt        breast    Past Surgical History:  Procedure Laterality Date   ANTERIOR CERVICAL  DECOMP/DISCECTOMY FUSION N/A 11/16/2012   Procedure: ANTERIOR CERVICAL DECOMPRESSION/DISCECTOMY FUSION 2 LEVELS;  Surgeon: Charlie Pitter, MD;  Location: [facility];  Service: Neurosurgery;  Laterality: N/A;  Cervical four-five, cervical five-six anterior cervical decompression fusion with PEEK and plate    BICEPS TENDON REPAIR     right   BRAVO PH STUDY  03/10/2012   Procedure: BRAVO [place];  Surgeon: Arta Silence, MD;  [facility];  Service: Endoscopy;  Laterality: N/A;   ESOPHAGOGASTRODUODENOSCOPY (EGD) WITH PROPOFOL  03/10/2012   Procedure: ESOPHAGOGASTRODUODENOSCOPY (EGD) WITH PROPOFOL;  Surgeon: Arta Silence, MD;  [facility];  Service: Endoscopy;  Laterality: N/A;   EYE SURGERY Bilateral    lasik   FRACTURE SURGERY     left ankle- bursectomy   HEMORROIDECTOMY  1995   KNEE ARTHROSCOPY      right   LAPAROSCOPIC APPENDECTOMY N/A 04/25/2014   Procedure: APPENDECTOMY LAPAROSCOPIC;  Surgeon: Rolm Bookbinder, MD;  Location: [place];  Service: General;  Laterality: N/A;   [place]     right   TOTAL KNEE ARTHROPLASTY Right 12/03/2021   Procedure: RIGHT TOTAL KNEE ARTHROPLASTY;  Surgeon: Meredith Pel, MD;  Location: [place] [facility];  Service: Orthopedics;  Laterality: Right;   UMBILICAL HERNIA REPAIR N/A 04/25/2014   Procedure: HERNIA REPAIR UMBILICAL ADULT (PRIMARY);  Surgeon: Rolm Bookbinder, MD;  Location: [facility];  Service: General;  Laterality: N/A;   VASECTOMY     Social History   Occupational History   Not on file  Tobacco Use   Smoking status: Never   Smokeless tobacco: Never  Vaping Use   Vaping Use: Never used  Substance and Sexual Activity   Alcohol use: Yes    Alcohol/week: 4.0 - 8.0 standard drinks of alcohol    Types: 4 - 8 Cans of beer per week    Comment: heavy on weekend   Drug use: No   Sexual activity: Yes

## 2022-03-04 ENCOUNTER — Telehealth: Payer: Self-pay | Admitting: Orthopedic Surgery

## 2022-03-04 ENCOUNTER — Other Ambulatory Visit: Payer: Self-pay | Admitting: Surgical

## 2022-03-04 MED ORDER — CLINDAMYCIN HCL 300 MG PO CAPS: 600.0000 mg | ORAL_CAPSULE | Freq: Once | ORAL | 0 refills | Status: AC

## 2022-03-04 NOTE — Telephone Encounter (Signed)
Steven Conley, please check on vascular referral and let patient know.  Steven Conley, patient has amoxicillin allergy. Please advise on something else we can send.

## 2022-03-04 NOTE — Telephone Encounter (Signed)
Sent in RX for clindamycin

## 2022-03-04 NOTE — Telephone Encounter (Signed)
Pt called ant has a question about his referral for Vascular SX. Please call [phone]. Also need to get pre medication for Dental cleaning.

## 2022-03-04 NOTE — Telephone Encounter (Signed)
Ok for amox 1 gram po 30 min before cleaning which should be done in new year - also pls check on referral to vascular and let him know thx

## 2022-03-04 NOTE — Telephone Encounter (Signed)
Patient aware antibiotic was sent in for him

## 2022-03-05 NOTE — Telephone Encounter (Signed)
Spoke with scheduler at [facility] and states the referral is in process and someone will contact pt to get scheduled. I caled and lvm on pt cell informing him of this.

## 2022-03-25 ENCOUNTER — Other Ambulatory Visit: Payer: Self-pay

## 2022-03-25 ENCOUNTER — Telehealth: Payer: Self-pay | Admitting: Neurology

## 2022-03-25 DIAGNOSIS — R251 Tremor, unspecified: Secondary | ICD-10-CM

## 2022-03-25 NOTE — Telephone Encounter (Signed)
Pt called in stating he would like to see about going ahead and trying to get the DAT Scan done before January. The trembling of his left leg has continued and is worse.

## 2022-03-25 NOTE — Telephone Encounter (Signed)
Called patient to discuss process for PA for Dat Scan and have sent that order to support

## 2022-03-26 ENCOUNTER — Telehealth: Payer: Self-pay

## 2022-03-26 NOTE — Telephone Encounter (Signed)
Approval Code for Dat Scan  [id number]

## 2022-04-08 ENCOUNTER — Encounter (HOSPITAL_COMMUNITY)
Admission: RE | Admit: 2022-04-08 | Discharge: 2022-04-08 | Disposition: A | Discharge status: Home / Self Care | Payer: 59 | Source: Ambulatory Visit | Attending: Neurology | Admitting: Neurology

## 2022-04-08 DIAGNOSIS — R251 Tremor, unspecified: Secondary | ICD-10-CM | POA: Insufficient documentation

## 2022-04-08 MED ORDER — POTASSIUM IODIDE (ANTIDOTE) 130 MG PO TABS
ORAL_TABLET | ORAL | Status: AC
  Filled 2022-04-08: qty 1

## 2022-04-08 MED ORDER — IOFLUPANE I 123 185 MBQ/2.5ML IV SOLN
4.5000 | Freq: Once | INTRAVENOUS | Status: AC | PRN
  Administered 2022-04-08: 4.5 via INTRAVENOUS

## 2022-04-23 ENCOUNTER — Other Ambulatory Visit: Payer: Self-pay | Admitting: *Deleted

## 2022-04-23 DIAGNOSIS — M79604 Pain in right leg: Secondary | ICD-10-CM

## 2022-04-25 ENCOUNTER — Ambulatory Visit (HOSPITAL_COMMUNITY)
Admission: RE | Admit: 2022-04-25 | Discharge: 2022-04-25 | Disposition: A | Discharge status: Home / Self Care | Payer: 59 | Source: Ambulatory Visit | Attending: Vascular Surgery | Admitting: Vascular Surgery

## 2022-04-25 DIAGNOSIS — M79604 Pain in right leg: Secondary | ICD-10-CM | POA: Diagnosis present

## 2022-05-07 ENCOUNTER — Encounter: Payer: Self-pay | Admitting: Vascular Surgery

## 2022-05-07 ENCOUNTER — Encounter (HOSPITAL_COMMUNITY): Payer: 59

## 2022-05-07 ENCOUNTER — Ambulatory Visit: Payer: 59 | Admitting: Vascular Surgery

## 2022-05-07 VITALS — BP 130/78 | HR 67 | Temp 98.1°F | Resp 16 | Ht 69.0 in | Wt 191.0 lb

## 2022-05-07 DIAGNOSIS — I872 Venous insufficiency (chronic) (peripheral): Secondary | ICD-10-CM

## 2022-05-07 NOTE — Progress Notes (Signed)
ASSESSMENT & PLAN   CHRONIC VENOUS INSUFFICIENCY: This patient has deep venous reflux but no significant superficial venous reflux.  He has chronic clot in the popliteal vein which is partially compressible.  The great saphenous vein is large but there is no reflux noted.  We have discussed the importance of intermittent leg elevation and the proper positioning for this.  I have encouraged him to wear his compression stockings when he will be sitting for a prolonged period of time.  We discussed the importance of exercise specifically walking and water aerobics.  I encouraged him to avoid prolonged sitting and standing.  Given the possible AVM noted below and the enlarged great saphenous vein without significant reflux I would like to see him back in 9 months rather than waiting a whole year.  Will get a follow-up study at that time.  He knows to call sooner if he has problems.  POSSIBLE AVM: The venous duplex study in December noted some abnormal flow in the femoral vein in the mid thigh.  It was felt that this could possibly be consistent with an AVM.  However, I did not see anything abnormal today with the SonoSite.  I do not palpate a mass on exam and there are no skin changes to suggest an underlying AVM currently.  The only way to further work this up would be an MRI which at this point I think would have a very low yield.  REASON FOR CONSULT:    Venous insufficiency.  The consult is requested by Dr. Marlou Sa.  HPI:   Steven Conley is a 62 y.o. male who underwent a right total knee replacement earlier this year.  He was seen in follow-up on 02/26/2022.  At that time he was 3 months out.  He was complaining of chronic swelling in the right leg and has a known history of venous insufficiency.  He requested vascular consultation.  On my history, the patient developed a right lower extremity DVT back in 2002.  He was subsequently found to have a factor V Leiden mutation and has been maintained on  Coumadin since that time.  He had no previous venous procedures.  He has not been elevating his legs much.  He does not wear compression stockings.  He had a long history of some discoloration of the right leg.  He has done well since his right total knee replacement in August.  He describes aching pain in his leg which has had essentially since he had the blood clot 20 years ago.  This does not appear to be aggravated by activity.  Past Medical History:  Diagnosis Date   Blood dyscrasia    Cancer ([place])    squamous cell   Chronic kidney disease    acute renal failure no dialysis due to dehydration   Colon polyp    DVT (deep venous thrombosis) ([facility])    Dysrhythmia    Ejection fraction    .   GERD (gastroesophageal reflux disease)    Hemorrhoid    Homozygous Factor V Leiden mutation ([place])    Dr. Lenna Sciara. [place],PCP-follows   Hyperlipidemia    Hypertension    not currently on BP medication   PAT (paroxysmal atrial tachycardia)    PONV (postoperative nausea and vomiting)    no problems with Propofol use   Sleep apnea 2015   uses CPAP intermittently    Family History  Problem Relation Age of Onset   Cancer Mother 62  breast   Cancer Sister        breast   Cerebral aneurysm Sister    Cancer Brother        stomach   Cancer Maternal Aunt        breast    SOCIAL HISTORY: Social History   Tobacco Use   Smoking status: Never   Smokeless tobacco: Never  Substance Use Topics   Alcohol use: Yes    Alcohol/week: 4.0 - 8.0 standard drinks of alcohol    Types: 4 - 8 Cans of beer per week    Comment: heavy on weekend    Allergies  Allergen Reactions   Amoxicillin Other (See Comments)    Pt unsure of reaction    Dexilant [Dexlansoprazole] Other (See Comments)    Stomach issues    Lexapro [Escitalopram] Other (See Comments)    Stomach Issues    Sulfa Drugs Cross Reactors Hives   Zoloft [Sertraline Hcl] Other (See Comments)    Stomach issues     Current Outpatient  Medications  Medication Sig Dispense Refill   atorvastatin (LIPITOR) 20 MG tablet Take 20 mg by mouth daily.     diltiazem (CARDIZEM CD) 300 MG 24 hr capsule Take 1 capsule (300 mg total) by mouth daily. 90 capsule 3   diltiazem (CARDIZEM) 30 MG tablet Take 1 tablet (30 mg total) by mouth 4 (four) times daily as needed (fast heart beating). 30 tablet 8   doxylamine, Sleep, (UNISOM) 25 MG tablet Take 25 mg by mouth at bedtime as needed for sleep.     lisinopril (PRINIVIL,ZESTRIL) 10 MG tablet Take 10 mg by mouth daily.     omeprazole (PRILOSEC) 40 MG capsule Take 40 mg by mouth daily.     oxyCODONE (ROXICODONE) 5 MG immediate release tablet Take 1 tablet (5 mg total) by mouth every 8 (eight) hours as needed for severe pain. (Patient not taking: Reported on 05/07/2022) 30 tablet 0   Propylene Glycol (SYSTANE COMPLETE) 0.6 % SOLN Place 1 drop into both eyes daily as needed (dry eyes).     testosterone (ANDROGEL) 50 MG/5GM (1%) GEL Place 5 g onto the skin daily.     valACYclovir (VALTREX) 1000 MG tablet Take 1,000 mg by mouth daily.     vitamin B-12 (CYANOCOBALAMIN) 1000 MCG tablet Take 1,000 mcg by mouth daily.     warfarin (COUMADIN) 5 MG tablet Take 2.5-5 mg by mouth See admin instructions. Per patient taking 5 mg on Monday's and taking 2.5 mg Tues.,Wed., Thurs., Fri., Sat., and Sun.     acetaminophen (TYLENOL) 325 MG tablet Take 2 tablets (650 mg total) by mouth every 6 (six) hours as needed for mild pain (pain score 1-3 or temp > 100.5). (Patient taking differently: Take 650 mg by mouth every 6 (six) hours as needed for mild pain (pain score 1-3 or temp > 100.5). 1000 due to surgery) 30 tablet 0   albuterol (VENTOLIN HFA) 108 (90 Base) MCG/ACT inhaler Inhale 2 puffs into the lungs every 6 (six) hours as needed for wheezing or shortness of breath.     docusate sodium (COLACE) 100 MG capsule Take 1 capsule (100 mg total) by mouth 2 (two) times daily. (Patient not taking: Reported on 05/07/2022) 10  capsule 0   HYDROcodone-acetaminophen (NORCO/VICODIN) 5-325 MG tablet 1 po q 12 hrs prn pain (Patient not taking: Reported on 05/07/2022) 35 tablet 0   methocarbamol (ROBAXIN) 500 MG tablet Take 1 tablet (500 mg total) by mouth every 8 (  eight) hours as needed for muscle spasms. (Patient not taking: Reported on 05/07/2022) 30 tablet 0   methocarbamol (ROBAXIN) 500 MG tablet Take 1 tablet (500 mg total) by mouth every 8 (eight) hours as needed for muscle spasms. (Patient not taking: Reported on 05/07/2022) 30 tablet 0   ondansetron (ZOFRAN) 4 MG tablet Take 1 tablet (4 mg total) by mouth every 8 (eight) hours as needed for nausea. (Patient not taking: Reported on 05/07/2022) 20 tablet 0   No current facility-administered medications for this visit.    REVIEW OF SYSTEMS:  '[X]'$  denotes positive finding, '[ ]'$  denotes negative finding Cardiac  Comments:  Chest pain or chest pressure:    Shortness of breath upon exertion:    Short of breath when lying flat:    Irregular heart rhythm: x       Vascular    Pain in calf, thigh, or hip brought on by ambulation: x   Pain in feet at night that wakes you up from your sleep:  x   Blood clot in your veins: x   Leg swelling:  x       Pulmonary    Oxygen at home:    Productive cough:     Wheezing:         Neurologic    Sudden weakness in arms or legs:     Sudden numbness in arms or legs:     Sudden onset of difficulty speaking or slurred speech:    Temporary loss of vision in one eye:     Problems with dizziness:  x       Gastrointestinal    Blood in stool:     Vomited blood:         Genitourinary    Burning when urinating:  x   Blood in urine:        Psychiatric    Major depression:         Hematologic    Bleeding problems: x   Problems with blood clotting too easily: x       Skin    Rashes or ulcers: x       Constitutional    Fever or chills:    -  PHYSICAL EXAM:   Vitals:   05/07/22 1436  BP: 130/78  Pulse: 67  Resp: 16   Temp: 98.1 F (36.7 C)  TempSrc: Temporal  SpO2: 95%  Weight: 191 lb (86.6 kg)  Height: '5\' 9"'$  (1.753 m)   Body mass index is 28.21 kg/m. GENERAL: The patient is a well-nourished male, in no acute distress. The vital signs are documented above. CARDIAC: There is a regular rate and rhythm.  VASCULAR: I do not detect carotid bruits. He has palpable pedal pulses. He has mild right lower extremity swelling with hyperpigmentation.  I did look at his right great saphenous vein myself with the SonoSite.  The vein is dilated but I could not demonstrate reflux either. PULMONARY: There is good air exchange bilaterally without wheezing or rales. ABDOMEN: Soft and non-tender with normal pitched bowel sounds.  MUSCULOSKELETAL: There are no major deformities. NEUROLOGIC: No focal weakness or paresthesias are detected. SKIN: There are no ulcers or rashes noted. PSYCHIATRIC: The patient has a normal affect.  DATA:    VENOUS DUPLEX: I have reviewed the venous duplex scan that was done on 04/25/2022 of the right lower extremity.  There was chronic DVT noted in the popliteal vein.  There was deep venous reflux involving the femoral vein  and popliteal vein.  There is abnormal flow in the mid femoral vein suggesting a possible AVM.  There was no significant reflux in the great saphenous vein although the vein was dilated with diameters ranging from 4.7-7 mm.  Results of this study are summarized in the diagram below.       Deitra Mayo [facility]

## 2022-05-14 ENCOUNTER — Other Ambulatory Visit: Payer: Self-pay

## 2022-05-14 DIAGNOSIS — I872 Venous insufficiency (chronic) (peripheral): Secondary | ICD-10-CM

## 2022-05-21 ENCOUNTER — Ambulatory Visit (INDEPENDENT_AMBULATORY_CARE_PROVIDER_SITE_OTHER): Payer: 59

## 2022-05-21 ENCOUNTER — Encounter: Payer: Self-pay | Admitting: Orthopedic Surgery

## 2022-05-21 ENCOUNTER — Ambulatory Visit: Payer: 59 | Admitting: Orthopedic Surgery

## 2022-05-21 DIAGNOSIS — Z96651 Presence of right artificial knee joint: Secondary | ICD-10-CM | POA: Diagnosis not present

## 2022-05-21 NOTE — Progress Notes (Signed)
Office Visit Note   Patient: Steven Conley           Date of Birth: 09-20-1960           MRN: [id number] Visit Date: 05/21/2022 Requested by: Lavone Orn, MD [address] [place],  [place] [address] PCP: Kathalene Frames, MD  Subjective: Chief Complaint  Patient presents with   Right Knee - Follow-up    Right TKA 12/03/21    HPI: Steven Conley is a 62 y.o. male who presents to the office reporting right knee pain.  Patient had right total knee replacement 12/03/2021.  Describes occasional continued pain and stiffness.  Going to physical therapy but feels like he could discontinue physical therapy.  May try some low intensity saline in the spring.  He states he is "making it".  Doing his own physical therapy..                ROS: All systems reviewed are negative as they relate to the chief complaint within the history of present illness.  Patient denies fevers or chills.  Assessment & Plan: Visit Diagnoses:  1. S/P total knee arthroplasty, right     Plan: Impression is well-functioning right total knee replacement with excellent range of motion.  Radiographs look good today.  No effusion.  Plan is to continue strengthening.  Come back in August for 1 year follow-up and clinical recheck and possible radiographs at that time.  Overall he is doing well.  Follow-Up Instructions: No follow-ups on file.   Orders:  Orders Placed This Encounter  Procedures   XR KNEE 3 VIEW RIGHT   No orders of the defined types were placed in this encounter.     Procedures: No procedures performed   Clinical Data: No additional findings.  Objective: Vital Signs: There were no vitals taken for this visit.  Physical Exam:  Constitutional: Patient appears well-developed HEENT:  Head: Normocephalic Eyes:EOM are normal Neck: Normal range of motion Cardiovascular: Normal rate Pulmonary/chest: Effort normal Neurologic: Patient is alert Skin: Skin is  warm Psychiatric: Patient has normal mood and affect  Ortho Exam: Ortho exam demonstrates no calf tenderness.  Negative Homans.  Range of motion is 0-1 20.  Collaterals are stable to varus stress at 0 30 and 90 degrees.  He has minimal tenderness around the patella or femur or tibia.  Specialty Comments:  No specialty comments available.  Imaging: XR KNEE 3 VIEW RIGHT  Result Date: 05/21/2022 AP lateral merchant radiographs right knee reviewed.  Press-fit prosthesis in good position alignment with no complicating features.  No lucencies around the femur or tibial or patellar component.  Small ossicle off the inferior pole of the patella relatively unchanged appearance compared to prior radiographs.    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 ([place]) 05/16/2014   Ejection fraction    PAF (paroxysmal atrial fibrillation) ([place]) 01/09/2013   Warfarin anticoagulation 01/09/2013   Spondylosis, cervical, with myelopathy 11/16/2012   Factor V Leiden mutation ([place]) 09/07/2012   ARF (acute renal failure) ([place]) 09/07/2012   SOB (shortness of breath) 09/07/2012   Chest discomfort 09/07/2012   HTN (hypertension) 09/07/2012   GERD (gastroesophageal reflux disease) 09/07/2012   HLD (hyperlipidemia) 09/07/2012  History of DVT of lower extremity 09/07/2012   Internal hemorrhoids with complication 24/23/5361   Past Medical History:  Diagnosis Date   Blood dyscrasia    Cancer ([facility])    squamous cell   Chronic kidney disease    acute renal failure no dialysis due to dehydration   Colon polyp    DVT (deep venous thrombosis) ([facility])    Dysrhythmia    Ejection fraction    .   GERD (gastroesophageal reflux disease)    Hemorrhoid    Homozygous Factor V Leiden mutation  ([place])    Dr. Lenna Sciara. [place],PCP-follows   Hyperlipidemia    Hypertension    not currently on BP medication   PAT (paroxysmal atrial tachycardia)    PONV (postoperative nausea and vomiting)    no problems with Propofol use   Sleep apnea 2015   uses CPAP intermittently    Family History  Problem Relation Age of Onset   Cancer Mother 45       breast   Cancer Sister        breast   Cerebral aneurysm Sister    Cancer Brother        stomach   Cancer Maternal Aunt        breast    Past Surgical History:  Procedure Laterality Date   ANTERIOR CERVICAL DECOMP/DISCECTOMY FUSION N/A 11/16/2012   Procedure: ANTERIOR CERVICAL DECOMPRESSION/DISCECTOMY FUSION 2 LEVELS;  Surgeon: Charlie Pitter, MD;  Location: [facility];  Service: Neurosurgery;  Laterality: N/A;  Cervical four-five, cervical five-six anterior cervical decompression fusion with PEEK and plate    BICEPS TENDON REPAIR     right   BRAVO PH STUDY  03/10/2012   Procedure: BRAVO [place];  Surgeon: Arta Silence, MD;  Location: [facility];  Service: Endoscopy;  Laterality: N/A;   ESOPHAGOGASTRODUODENOSCOPY (EGD) WITH PROPOFOL  03/10/2012   Procedure: ESOPHAGOGASTRODUODENOSCOPY (EGD) WITH PROPOFOL;  Surgeon: Arta Silence, MD;  Location: [facility];  Service: Endoscopy;  Laterality: N/A;   EYE SURGERY Bilateral    lasik   FRACTURE SURGERY     left ankle- bursectomy   HEMORROIDECTOMY  1995   KNEE ARTHROSCOPY     right   LAPAROSCOPIC APPENDECTOMY N/A 04/25/2014   Procedure: APPENDECTOMY LAPAROSCOPIC;  Surgeon: Rolm Bookbinder, MD;  Location: [facility];  Service: General;  Laterality: N/A;   [place]     right   TOTAL KNEE ARTHROPLASTY Right 12/03/2021   Procedure: RIGHT TOTAL KNEE ARTHROPLASTY;  Surgeon: Meredith Pel, MD;  Location: [facility];  Service: Orthopedics;  Laterality: Right;   UMBILICAL HERNIA REPAIR N/A 04/25/2014   Procedure: HERNIA REPAIR UMBILICAL ADULT  (PRIMARY);  Surgeon: Rolm Bookbinder, MD;  Location[facility];  Service: General;  Laterality: N/A;   VASECTOMY     Social History   Occupational History   Not on file  Tobacco Use   Smoking status: Never   Smokeless tobacco: Never  Vaping Use   Vaping Use: Never used  Substance and Sexual Activity   Alcohol use: Yes    Alcohol/week: 4.0 - 8.0 standard drinks of alcohol    Types: 4 - 8 Cans of beer per week    Comment: heavy on weekend   Drug use: No   Sexual activity: Yes

## 2022-06-24 ENCOUNTER — Other Ambulatory Visit: Payer: Self-pay | Admitting: Cardiology

## 2022-09-25 ENCOUNTER — Telehealth: Payer: Self-pay | Admitting: *Deleted

## 2022-09-25 NOTE — Telephone Encounter (Signed)
   Pre-operative Risk Assessment    Patient Name: Steven Conley  DOB: [birth year] MRN: [id number]      Request for Surgical Clearance    Procedure:   COLONOSCOPY/ENDOSCOPY ; H/O COLON POLYPS AND GASTRIC POLYPS  Date of Surgery:  Clearance 09/30/22                                 Surgeon:  DR. Dulce Sellar Surgeon's Group or Practice Name:  [facility] Phone number:  [phone] Fax number:  [phone]   Type of Clearance Requested:   - Medical  - Pharmacy:  Hold Warfarin (Coumadin)     Type of Anesthesia:   PROPOFOL    Additional requests/questions:    [name]   09/25/2022, 9:22 AM

## 2022-09-26 NOTE — Telephone Encounter (Signed)
    Primary Cardiologist:Traci Turner, MD  Chart reviewed as part of pre-operative protocol coverage. Because of KAITO DIMLER past medical history and time since last visit, he/she will require a follow-up visit in order to better assess preoperative cardiovascular risk.  Pre-op covering staff: - Please schedule office appointment and call patient to inform them. - Please contact requesting surgeon's office via preferred method (i.e, phone, fax) to inform them of need for appointment prior to surgery.  If applicable, this message will also be routed to pharmacy pool and/or primary cardiologist for input on holding anticoagulant/antiplatelet agent as requested below so that this information is available at time of patient's appointment.   Patient with diagnosis of afib, DVT and homozygous factor V Leiden mutation on warfarin for anticoagulation. PCP is managing anticoag, recommend that rec comes from their office.   Ronney Asters, NP  09/26/2022, 10:25 AM

## 2022-09-26 NOTE — Telephone Encounter (Signed)
I will update the requesting office the pt's procedure may need to be pushed out a bit. The pt needs an appt IN OFFICE as well as will need Lovenox bridge.

## 2022-09-26 NOTE — Telephone Encounter (Signed)
Patient with diagnosis of afib, DVT and homozygous factor V Leiden mutation on warfarin for anticoagulation. PCP is managing anticoag, recommend that rec comes from their office. He will likely require Lovenox bridging due to his hx of homozygous factor V Leiden. From an afib perspective, he's low risk and fine to hold warfarin as needed. 

## 2022-09-26 NOTE — Telephone Encounter (Signed)
I tried to call the pt to schedule in office appt for pre op clearance, though no answer. See previous notes.

## 2022-09-29 NOTE — Telephone Encounter (Signed)
Patient is returning call. But patient also states that our office shouldn't be involve because his PCP is the one that prescribe the medication in questions. Please advise

## 2022-09-29 NOTE — Telephone Encounter (Signed)
Spoke with Tiffany from requesting office and she states that a clearance is no longer needed from [place] due to patients PCP managing anticoag that has already been addressed by their office. Patient made aware. Will remove from pool

## 2022-09-29 NOTE — Telephone Encounter (Signed)
2nd attempt to reach pt to schedule in office appt for preop clearance. No answer lvm

## 2022-10-01 ENCOUNTER — Ambulatory Visit: Payer: 59 | Admitting: Neurology

## 2022-10-03 ENCOUNTER — Ambulatory Visit: Payer: 59 | Admitting: Cardiology

## 2022-11-10 NOTE — Progress Notes (Unsigned)
Assessment/Plan:    LLE rest tremor Discussed with patient that he does not meet criteria for Parkinson's disease currently.  The only feature that he has is rest tremor.  Bradykinesia would be required for this diagnosis and he really does not have any of that.  Discussed that this could be an early symptom that Parkinson's could be coming in the future, but he does not meet clinical criteria currently. DaTscan done December, 2023 with decreased radiotracer activity in the right stratum compared to that of the left. Discussed the importance of safe, cardiovascular exercise. We discussed decreasing alcohol, which he is doing now, particularly since his knee surgery last month, since he is on oxycodone. They asked about medication.  I really would not recommend medication at this point in time.  I think we would give him more side effects than benefit.   2.  osas             -noncompliant with cpap             -This, along with nocturia, may be contributing to restless sleep.   3.  Possible early rbd             -Noted primarily by the patient's girlfriend.  Discussed that this can be a premotor sign to Parkinson's disease, but doesn't allow [place] to dx it any earlier  4.  A-fib, DVT, Factor V leiden  -on coumadin Subjective:   Steven Conley was seen today in follow up for LLE tremor.  My previous records were reviewed prior to todays visit as well as outside records available to me. Pt denies falls.  Pt denies lightheadedness, near syncope.  No hallucinations.  Patient called a few months after our last visit and wanted to schedule a DaTscan.  This demonstrated decreased uptake in the right stratum compared to that of the left.  Following that, we did tell the patient that he still did not meet criteria for Parkinson's disease, but we wanted to make sure he was exercising vigorously and decreasing alcohol intake.  Current prescribed movement disorder medications: ***   PREVIOUS  MEDICATIONS: {Parkinson's RX:18200}  ALLERGIES:   Allergies  Allergen Reactions   Amoxicillin Other (See Comments)    Pt unsure of reaction    Dexilant [Dexlansoprazole] Other (See Comments)    Stomach issues    Lexapro [Escitalopram] Other (See Comments)    Stomach Issues    Sulfa Drugs Cross Reactors Hives   Zoloft [Sertraline Hcl] Other (See Comments)    Stomach issues     CURRENT MEDICATIONS:  No outpatient medications have been marked as taking for the 11/12/22 encounter (Appointment) with Hudsen Fei, Octaviano Batty, DO.     Objective:   PHYSICAL EXAMINATION:    VITALS:  There were no vitals filed for this visit.  GEN:  The patient appears stated age and is in NAD. HEENT:  Normocephalic, atraumatic.  The mucous membranes are moist. The superficial temporal arteries are without ropiness or tenderness. CV:  RRR Lungs:  CTAB Neck/HEME:  There are no carotid bruits bilaterally.  Neurological examination:  Orientation: The patient is alert and oriented x3.  Cranial nerves: There is good facial symmetry.  Extraocular muscles are intact. The visual fields are full to confrontational testing. The speech is fluent and clear. Soft palate rises symmetrically and there is no tongue deviation. Hearing is intact to conversational tone. Sensation: Sensation is intact to light touch throughout (facial, trunk, extremities). Vibration is intact at the  bilateral big toe. There is no extinction with double simultaneous stimulation.  Motor: Strength is 5/5 in the bilateral upper and lower extremities.   Shoulder shrug is equal and symmetric.  There is no pronator drift. Deep tendon reflexes: Deep tendon reflexes are 2+/4 at the bilateral biceps, triceps, brachioradialis, left patella (not tested on R due to recent knee surgery and pain) and achilles.  There is no ankle clonus.  Plantar responses are downgoing bilaterally.   Movement examination: Tone: There is normal tone in the bilateral upper  extremities.  The tone in the lower extremities is normal.  Abnormal movements: there is LLE rest tremor.  This does not change with distraction procedures.  No upper extremity tremor, even with distraction procedures. Coordination:  There is no decremation with RAM's, with any form of RAMS, including alternating supination and pronation of the forearm, hand opening and closing, finger taps, heel taps and toe taps.  Gait and Station: The patient pushes off to arise (just had a knee surgery).  He has a cane, but uses it only a little bit for balance in the hallway because of the recent knee surgery.  He does not shuffle.  He actually walks quite well in the hall with surprisingly good stride length that is not particularly antalgic following the recent knee replacement  I have reviewed and interpreted the following labs independently    Chemistry      Component Value Date/Time   NA 138 12/03/2021 0636   NA 140 01/24/2020 1551   K 3.8 12/03/2021 0636   CL 105 12/03/2021 0636   CO2 24 12/03/2021 0636   BUN 11 12/03/2021 0636   BUN 16 01/24/2020 1551   CREATININE 1.10 12/03/2021 0636      Component Value Date/Time   CALCIUM 9.2 12/03/2021 0636   ALKPHOS 56 09/07/2012 1711   AST 38 (H) 09/07/2012 1711   ALT 93 (H) 09/07/2012 1711   BILITOT 0.6 09/07/2012 1711       Lab Results  Component Value Date   WBC 14.0 (H) 12/05/2021   HGB 14.5 12/05/2021   HCT 41.6 12/05/2021   MCV 90.8 12/05/2021   PLT 115 (L) 12/05/2021    Lab Results  Component Value Date   TSH 1.400 01/24/2020     Total time spent on today's visit was ***30 minutes, including both face-to-face time and nonface-to-face time.  Time included that spent on review of records (prior notes available to me/labs/imaging if pertinent), discussing treatment and goals, answering patient's questions and coordinating care.  Cc:  Emilio Aspen, MD

## 2022-11-12 ENCOUNTER — Ambulatory Visit: Payer: 59 | Admitting: Neurology

## 2022-11-12 ENCOUNTER — Encounter: Payer: Self-pay | Admitting: Neurology

## 2022-11-12 VITALS — BP 122/84 | HR 73 | Ht 69.0 in | Wt 181.3 lb

## 2022-11-12 DIAGNOSIS — R251 Tremor, unspecified: Secondary | ICD-10-CM

## 2022-11-12 NOTE — Patient Instructions (Signed)
Good to see you!  We discussed exercise of 150 minutes per week.     The physicians and staff at [facility] are committed to providing excellent care. You may receive a survey requesting feedback about your experience at our office. We strive to receive "very good" responses to the survey questions. If you feel that your experience would prevent you from giving the office a "very good " response, please contact our office to try to remedy the situation. We may be reached at [phone]. Thank you for taking the time out of your busy day to complete the survey.

## 2022-12-15 NOTE — Progress Notes (Unsigned)
Cardiology Office Note:    Date:  12/16/2022   ID:  [name], DOB March 25, 1961, MRN [id number]  PCP:  Emilio Aspen, MD  Cardiologist:  Armanda Magic, MD    Referring MD: Emilio Aspen, *   Chief Complaint  Patient presents with   Hypertension   Atrial Fibrillation   Hyperlipidemia    History of Present Illness:    Steven Conley is a 62 y.o. male with a hx of  HTN, PAF/PAT,dyslipidemia, DVT (with Factor V Leiden) and PAF. He is here today for followup and is doing well.  He denies any chest pain or pressure, SOB, DOE, PND, orthopnea, LE edema, dizziness or syncope.  He is palpitations that occur several times weekly.  Sometimes it will go on for hours. He will have to take upt to 60mg  of short acting Cardizem to break it. He has lost 8-10lbs which he thinks has helped. He is compliant with his meds and is tolerating meds with no SE.     Past Medical History:  Diagnosis Date   Blood dyscrasia    Cancer (HCC)    squamous cell   Chronic kidney disease    acute renal failure no dialysis due to dehydration   Colon polyp    DVT (deep venous thrombosis) (HCC)    Dysrhythmia    Ejection fraction    .   GERD (gastroesophageal reflux disease)    Hemorrhoid    Homozygous Factor V Leiden mutation (HCC)    Dr. Trinna Post   Hyperlipidemia    Hypertension    not currently on BP medication   PAT (paroxysmal atrial tachycardia)    PONV (postoperative nausea and vomiting)    no problems with Propofol use   Sleep apnea 2015   uses CPAP intermittently    Past Surgical History:  Procedure Laterality Date   ANTERIOR CERVICAL DECOMP/DISCECTOMY FUSION N/A 11/16/2012   Procedure: ANTERIOR CERVICAL DECOMPRESSION/DISCECTOMY FUSION 2 LEVELS;  Surgeon: Temple Pacini, MD;  Location: [facility];  Service: Neurosurgery;  Laterality: N/A;  Cervical four-five, cervical five-six anterior cervical decompression fusion with PEEK and plate    BICEPS TENDON REPAIR      right   BRAVO PH STUDY  03/10/2012   Procedure: BRAVO PH STUDY;  Surgeon: Willis Modena, MD;  Location: [facility];  Service: Endoscopy;  Laterality: N/A;   ESOPHAGOGASTRODUODENOSCOPY (EGD) WITH PROPOFOL  03/10/2012   Procedure: ESOPHAGOGASTRODUODENOSCOPY (EGD) WITH PROPOFOL;  Surgeon: Willis Modena, MD;  Location: [facility];  Service: Endoscopy;  Laterality: N/A;   EYE SURGERY Bilateral    lasik   FRACTURE SURGERY     left ankle- bursectomy   HEMORROIDECTOMY  1995   KNEE ARTHROSCOPY     right   LAPAROSCOPIC APPENDECTOMY N/A 04/25/2014   Procedure: APPENDECTOMY LAPAROSCOPIC;  Surgeon: Emelia Loron, MD;  Location: [facility] OR;  Service: General;  Laterality: N/A;   NECK SURGERY     NOSE SURGERY  1972, 1985   SHOULDER SURGERY     right   TOTAL KNEE ARTHROPLASTY Right 12/03/2021   Procedure: RIGHT TOTAL KNEE ARTHROPLASTY;  Surgeon: Cammy Copa, MD;  Location: [facility] OR;  Service: Orthopedics;  Laterality: Right;   UMBILICAL HERNIA REPAIR N/A 04/25/2014   Procedure: HERNIA REPAIR UMBILICAL ADULT (PRIMARY);  Surgeon: Emelia Loron, MD;  Location: [facility] OR;  Service: General;  Laterality: N/A;   VASECTOMY      Current Medications: Current Meds  Medication Sig   acetaminophen (TYLENOL) 325 MG  tablet Take 2 tablets (650 mg total) by mouth every 6 (six) hours as needed for mild pain (pain score 1-3 or temp > 100.5). (Patient taking differently: Take 650 mg by mouth every 6 (six) hours as needed for mild pain (pain score 1-3 or temp > 100.5). 1000 due to surgery)   albuterol (VENTOLIN HFA) 108 (90 Base) MCG/ACT inhaler Inhale 2 puffs into the lungs every 6 (six) hours as needed for wheezing or shortness of breath.   atorvastatin (LIPITOR) 20 MG tablet Take 20 mg by mouth daily.   buPROPion (WELLBUTRIN XL) 150 MG 24 hr tablet Take 150 mg by mouth daily.   diltiazem (CARDIZEM CD) 300 MG 24 hr capsule Take 1 capsule (300 mg total) by mouth daily.   diltiazem (CARDIZEM) 30 MG tablet  Take 1 tablet (30 mg total) by mouth 4 (four) times daily as needed (fast heart beating).   doxylamine, Sleep, (UNISOM) 25 MG tablet Take 25 mg by mouth at bedtime as needed for sleep.   lisinopril (PRINIVIL,ZESTRIL) 10 MG tablet Take 10 mg by mouth daily.   omeprazole (PRILOSEC) 40 MG capsule Take 40 mg by mouth daily.   Propylene Glycol (SYSTANE COMPLETE) 0.6 % SOLN Place 1 drop into both eyes daily as needed (dry eyes).   tadalafil (CIALIS) 5 MG tablet Take 5 mg by mouth daily as needed.   testosterone (ANDROGEL) 50 MG/5GM (1%) GEL Place 5 g onto the skin daily.   valACYclovir (VALTREX) 1000 MG tablet Take 1,000 mg by mouth daily.   vitamin B-12 (CYANOCOBALAMIN) 1000 MCG tablet Take 1,000 mcg by mouth daily.   warfarin (COUMADIN) 5 MG tablet Take 2.5-5 mg by mouth See admin instructions. Per patient taking 5 mg on Monday's and taking 2.5 mg Tues.,Wed., Thurs., Fri., Sat., and Sun.     Allergies:   Amoxicillin, Dexilant [dexlansoprazole], Lexapro [escitalopram], Sulfa drugs cross reactors, and Zoloft [sertraline hcl]   Social History   Socioeconomic History   Marital status: Widowed    Spouse name: Not on file   Number of children: 1   Years of education: Not on file   Highest education level: Not on file  Occupational History   Not on file  Tobacco Use   Smoking status: Never   Smokeless tobacco: Never  Vaping Use   Vaping status: Never Used  Substance and Sexual Activity   Alcohol use: Yes    Alcohol/week: 4.0 - 8.0 standard drinks of alcohol    Types: 4 - 8 Cans of beer per week    Comment: heavy on weekend   Drug use: No   Sexual activity: Yes  Other Topics Concern   Not on file  Social History Narrative   Right handed   Caffeine 2-3 big glasses daily   Lives in two level home   Social Determinants of Health   Financial Resource Strain: Not on file  Food Insecurity: Not on file  Transportation Needs: Not on file  Physical Activity: Not on file  Stress: Not on  file  Social Connections: Not on file     Family History: The patient's family history includes Cancer in his brother, maternal aunt, and sister; Cancer (age of onset: 37) in his mother; Cerebral aneurysm in his sister.  ROS:   Please see the history of present illness.    ROS  All other systems reviewed and negative.   EKGs/Labs/Other Studies Reviewed:    The following studies were reviewed today: EKG Interpretation Date/Time:  Tuesday December 16 2022 08:24:12 EDT Ventricular Rate:  62 PR Interval:  152 QRS Duration:  84 QT Interval:  428 QTC Calculation: 434 R Axis:   70  Text Interpretation: Normal sinus rhythm Normal ECG When compared with ECG of 31-Jan-2013 14:49, No significant change was found Confirmed by Armanda Magic ([id number]) on 12/16/2022 8:42:52 AM     Recent Labs: No results found for requested labs within last 365 days.   Recent Lipid Panel No results found for: "CHOL", "TRIG", "HDL", "CHOLHDL", "VLDL", "LDLCALC", "LDLDIRECT"  Physical Exam:    VS:  BP (!) 140/82 (BP Location: Left Arm, Patient Position: Sitting, Cuff Size: Normal)   Pulse 62   Ht 5\' 9"  (1.753 m)   Wt 180 lb (81.6 kg)   SpO2 95%   BMI 26.58 kg/m     Wt Readings from Last 3 Encounters:  12/16/22 180 lb (81.6 kg)  11/12/22 181 lb 4.8 oz (82.2 kg)  05/07/22 191 lb (86.6 kg)    GEN: Well nourished, well developed in no acute distress HEENT: Normal NECK: No JVD; No carotid bruits LYMPHATICS: No lymphadenopathy CARDIAC:RRR, no murmurs, rubs, gallops RESPIRATORY:  Clear to auscultation without rales, wheezing or rhonchi  ABDOMEN: Soft, non-tender, non-distended MUSCULOSKELETAL:  No edema; No deformity  SKIN: Warm and dry NEUROLOGIC:  Alert and oriented x 3 PSYCHIATRIC:  Normal affect  ASSESSMENT:    1. PAF (paroxysmal atrial fibrillation) (HCC)   2. Primary hypertension     PLAN:    In order of problems listed above:  1.  PAF/PAT -He is maintaining NSR but still has problems  with breakthrough palpitations that can last a few hours and has to take up to 60mg  of short acting Cardizem.  It is happening a few times weekly -I have recommended a 2 week ziopatch to see if he is having PAF or PAT and if it is PAF then He is interested in pursing ablation so I will refer to EP for evaluation if monitor shows PAF -He denies any bleeding issues on warfarin -Continue prescription drug management with Cardizem CD 300 mg daily and warfarin per pharmacy.  He also takes short acting Cardizem 30 mg as needed for breakthrough palpitations -I have personally reviewed and interpreted outside labs performed by patient's PCP which showed hemoglobin 15.9 on 11/24/2022  2.  HTN -BP is controlled on exam today -Continue prescription drug management with Cardizem CD 300 mg daily and lisinopril 10 mg daily with as needed refills -I have personally reviewed and interpreted outside labs performed by patient's PCP which showed serum creatinine 1.1 and potassium 4 on 11/24/2022  Followup with PA in 4 weeks   Medication Adjustments/Labs and Tests Ordered: Current medicines are reviewed at length with the patient today.  Concerns regarding medicines are outlined above.  Orders Placed This Encounter  Procedures   EKG 12-Lead   No orders of the defined types were placed in this encounter.   Signed, Armanda Magic, MD  12/16/2022 8:39 AM    [facility]

## 2022-12-16 ENCOUNTER — Other Ambulatory Visit: Payer: Self-pay | Admitting: Cardiology

## 2022-12-16 ENCOUNTER — Encounter: Payer: Self-pay | Admitting: Cardiology

## 2022-12-16 ENCOUNTER — Ambulatory Visit: Payer: 59 | Attending: Cardiology | Admitting: Cardiology

## 2022-12-16 ENCOUNTER — Ambulatory Visit (INDEPENDENT_AMBULATORY_CARE_PROVIDER_SITE_OTHER): Payer: 59

## 2022-12-16 VITALS — BP 140/82 | HR 62 | Ht 69.0 in | Wt 180.0 lb

## 2022-12-16 DIAGNOSIS — I10 Essential (primary) hypertension: Secondary | ICD-10-CM

## 2022-12-16 DIAGNOSIS — I480 Paroxysmal atrial fibrillation: Secondary | ICD-10-CM

## 2022-12-16 NOTE — Addendum Note (Signed)
Addended by: Luellen Pucker on: 12/16/2022 08:52 AM   Modules accepted: Orders

## 2022-12-16 NOTE — Patient Instructions (Signed)
Medication Instructions:  Your physician recommends that you continue on your current medications as directed. Please refer to the Current Medication list given to you today.  *If you need a refill on your cardiac medications before your next appointment, please call your pharmacy*   Lab Work: None.  If you have labs (blood work) drawn today and your tests are completely normal, you will receive your results only by: MyChart Message (if you have MyChart) OR A paper copy in the mail If you have any lab test that is abnormal or we need to change your treatment, we will call you to review the results.   Testing/Procedures: Christena Deem- Long Term Monitor Instructions  Your physician has requested you wear a ZIO patch monitor for 14 days.  This is a single patch monitor. [employer] supplies one patch monitor per enrollment. Additional stickers are not available. Please do not apply patch if you will be having a Nuclear Stress Test,  Echocardiogram, Cardiac CT, MRI, or Chest Xray during the period you would be wearing the  monitor. The patch cannot be worn during these tests. You cannot remove and re-apply the  ZIO XT patch monitor.  Your ZIO patch monitor will be mailed 3 day USPS to your address on file. It may take 3-5 days  to receive your monitor after you have been enrolled.  Once you have received your monitor, please review the enclosed instructions. Your monitor  has already been registered assigning a specific monitor serial # to you.  Billing and Patient Assistance Program Information  We have supplied [employer] with any of your insurance information on file for billing purposes. [employer] offers a sliding scale Patient Assistance Program for patients that do not have  insurance, or whose insurance does not completely cover the cost of the ZIO monitor.  You must apply for the Patient Assistance Program to qualify for this discounted rate.  To apply, please call [employer] at [phone],  select option 4, select option 2, ask to apply for  Patient Assistance Program. Meredeth Ide will ask your household income, and how many people  are in your household. They will quote your out-of-pocket cost based on that information.  [employer] will also be able to set up a 56-month, interest-free payment plan if needed.  Applying the monitor   Shave hair from upper left chest.  Hold abrader disc by orange tab. Rub abrader in 40 strokes over the upper left chest as  indicated in your monitor instructions.  Clean area with 4 enclosed alcohol pads. Let dry.  Apply patch as indicated in monitor instructions. Patch will be placed under collarbone on left  side of chest with arrow pointing upward.  Rub patch adhesive wings for 2 minutes. Remove white label marked "1". Remove the white  label marked "2". Rub patch adhesive wings for 2 additional minutes.  While looking in a mirror, press and release button in center of patch. A small green light will  flash 3-4 times. This will be your only indicator that the monitor has been turned on.  Do not shower for the first 24 hours. You may shower after the first 24 hours.  Press the button if you feel a symptom. You will hear a small click. Record Date, Time and  Symptom in the Patient Logbook.  When you are ready to remove the patch, follow instructions on the last 2 pages of Patient  Logbook. Stick patch monitor onto the last page of Patient Logbook.  Place Patient Logbook  in the blue and white box. Use locking tab on box and tape box closed  securely. The blue and white box has prepaid postage on it. Please place it in the mailbox as  soon as possible. Your physician should have your test results approximately 7 days after the  monitor has been mailed back to [facility].  Call [facility] Customer Care at [phone] if you have questions regarding  your ZIO XT patch monitor. Call them immediately if you see an orange light blinking on your   monitor.  If your monitor falls off in less than 4 days, contact our Monitor department at [phone].  If your monitor becomes loose or falls off after 4 days call [employer] at [phone] for  suggestions on securing your monitor    Follow-Up: At [facility], you and your health needs are our priority.  As part of our continuing mission to provide you with exceptional heart care, we have created designated Provider Care Teams.  These Care Teams include your primary Cardiologist (physician) and Advanced Practice Providers (APPs -  Physician Assistants and Nurse Practitioners) who all work together to provide you with the care you need, when you need it.  We recommend signing up for the patient portal called "MyChart".  Sign up information is provided on this After Visit Summary.  MyChart is used to connect with patients for Virtual Visits (Telemedicine).  Patients are able to view lab/test results, encounter notes, upcoming appointments, etc.  Non-urgent messages can be sent to your provider as well.   To learn more about what you can do with MyChart, go to [email or web address].    Your next appointment:   4 week(s)  Provider:   Jari Favre, Robin Searing, Eligha Bridegroom, Tereso Newcomer

## 2022-12-16 NOTE — Progress Notes (Unsigned)
Enrolled patient for a 14 day Zio XT  monitor to be mailed to patients home  °

## 2022-12-21 DIAGNOSIS — I480 Paroxysmal atrial fibrillation: Secondary | ICD-10-CM | POA: Diagnosis not present

## 2022-12-24 ENCOUNTER — Other Ambulatory Visit (INDEPENDENT_AMBULATORY_CARE_PROVIDER_SITE_OTHER): Payer: 59

## 2022-12-24 ENCOUNTER — Encounter: Payer: Self-pay | Admitting: Orthopedic Surgery

## 2022-12-24 ENCOUNTER — Ambulatory Visit: Payer: 59 | Admitting: Orthopedic Surgery

## 2022-12-24 DIAGNOSIS — Z96651 Presence of right artificial knee joint: Secondary | ICD-10-CM

## 2022-12-24 NOTE — Progress Notes (Signed)
Office Visit Note   Patient: Steven Conley           Date of Birth: [birth year]           MRN: [id number] Visit Date: 12/24/2022 Requested by: Emilio Aspen, MD [address] [place],  [place] [address] PCP: Emilio Aspen, MD  Subjective: Chief Complaint  Patient presents with   Other    23yr follow up s/p TKA    HPI: Steven Conley is a 62 y.o. male who presents to the office reporting mild right knee pain.  He underwent right total knee replacement about 1 year ago.  His weight has decreased to 170.  He is in the gym working out.  Does get whole knee soreness at times.  He was very vigorous with recent swimming and that made his knee hurt a little bit.  Denies any fevers and chills.  He does bike for cardio.  Still hard for him to sleep and he has general insomnia..                ROS: All systems reviewed are negative as they relate to the chief complaint within the history of present illness.  Patient denies fevers or chills.  Assessment & Plan: Visit Diagnoses:  1. S/P total knee arthroplasty, right     Plan: Impression is mild effusion right knee.  Slightly more than normal but could be related to his recent swimming.  The knee itself is not warm and he has excellent range of motion.  No indication to aspirate the knee at this time but I would like to see him back in 6 months just to recheck the knee for 1 final time.  If he has persistent effusion in the knee I would favor lab workup with possible aspiration if his infection parameters are elevated.  Radiographically the knee looks very good.  Follow-Up Instructions: No follow-ups on file.   Orders:  Orders Placed This Encounter  Procedures   XR Knee 1-2 Views Right   No orders of the defined types were placed in this encounter.     Procedures: No procedures performed   Clinical Data: No additional findings.  Objective: Vital Signs: There were no vitals taken for this  visit.  Physical Exam:  Constitutional: Patient appears well-developed HEENT:  Head: Normocephalic Eyes:EOM are normal Neck: Normal range of motion Cardiovascular: Normal rate Pulmonary/chest: Effort normal Neurologic: Patient is alert Skin: Skin is warm Psychiatric: Patient has normal mood and affect  Ortho Exam: Ortho exam demonstrates mild effusion in the right knee.  Extensor mechanism intact.  Range of motion 0-1 20.  Collaterals are stable to varus stress at 0 30 and 90 degrees.  No masses lymphadenopathy or skin changes noted in that right knee region.  Slight swelling right calf versus left but negative Homans.  Specialty Comments:  No specialty comments available.  Imaging: XR Knee 1-2 Views Right  Result Date: 12/24/2022 AP lateral radiographs right knee reviewed.  Total knee prosthesis in good position alignment with no complicating features.  Small ossification of the inferior pole of the patella noted which is unchanged in appearance from prior radiographs earlier this year.    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) 05/16/2014   Ejection fraction    PAF (paroxysmal atrial fibrillation) (HCC) 01/09/2013   Warfarin anticoagulation 01/09/2013   Spondylosis, cervical, with myelopathy 11/16/2012   Factor V Leiden mutation (HCC) 09/07/2012   ARF (acute renal failure) (HCC) 09/07/2012   SOB (shortness of breath) 09/07/2012   Chest discomfort 09/07/2012   HTN (hypertension) 09/07/2012   GERD (gastroesophageal reflux disease) 09/07/2012   HLD (hyperlipidemia) 09/07/2012   History of DVT of lower extremity 09/07/2012   Internal hemorrhoids with complication 01/27/2011   Past Medical History:  Diagnosis Date   Blood dyscrasia     Cancer (HCC)    squamous cell   Chronic kidney disease    acute renal failure no dialysis due to dehydration   Colon polyp    DVT (deep venous thrombosis) (HCC)    Dysrhythmia    Ejection fraction    .   GERD (gastroesophageal reflux disease)    Hemorrhoid    Homozygous Factor V Leiden mutation (HCC)    Dr. Shela Commons. Griffin,PCP-follows   Hyperlipidemia    Hypertension    not currently on BP medication   PAT (paroxysmal atrial tachycardia)    PONV (postoperative nausea and vomiting)    no problems with Propofol use   Sleep apnea 2015   uses CPAP intermittently    Family History  Problem Relation Age of Onset   Cancer Mother 91       breast   Cancer Sister        breast   Cerebral aneurysm Sister    Cancer Brother        stomach   Cancer Maternal Aunt        breast    Past Surgical History:  Procedure Laterality Date   ANTERIOR CERVICAL DECOMP/DISCECTOMY FUSION N/A 11/16/2012   Procedure: ANTERIOR CERVICAL DECOMPRESSION/DISCECTOMY FUSION 2 LEVELS;  Surgeon: Temple Pacini, MD;  Location: [facility];  Service: Neurosurgery;  Laterality: N/A;  Cervical four-five, cervical five-six anterior cervical decompression fusion with PEEK and plate    BICEPS TENDON REPAIR     right   BRAVO PH STUDY  03/10/2012   Procedure: BRAVO PH STUDY;  Surgeon: Willis Modena, MD;  Location: [facility];  Service: Endoscopy;  Laterality: N/A;   ESOPHAGOGASTRODUODENOSCOPY (EGD) WITH PROPOFOL  03/10/2012   Procedure: ESOPHAGOGASTRODUODENOSCOPY (EGD) WITH PROPOFOL;  Surgeon: Willis Modena, MD;  Location: [facility];  Service: Endoscopy;  Laterality: N/A;   EYE SURGERY Bilateral    lasik   FRACTURE SURGERY     left ankle- bursectomy   HEMORROIDECTOMY  1995   KNEE ARTHROSCOPY     right   LAPAROSCOPIC APPENDECTOMY N/A 04/25/2014   Procedure: APPENDECTOMY LAPAROSCOPIC;  Surgeon: Emelia Loron, MD;  Location: [facility] OR;  Service: General;  Laterality: N/A;   NECK SURGERY     NOSE SURGERY  1972, 1985    SHOULDER SURGERY     right   TOTAL KNEE ARTHROPLASTY Right 12/03/2021   Procedure: RIGHT TOTAL KNEE ARTHROPLASTY;  Surgeon: Cammy Copa, MD;  Location: [facility] OR;  Service: Orthopedics;  Laterality: Right;   UMBILICAL HERNIA REPAIR N/A 04/25/2014   Procedure: HERNIA REPAIR UMBILICAL ADULT (PRIMARY);  Surgeon: Emelia Loron, MD;  Location: [facility] OR;  Service: General;  Laterality: N/A;   VASECTOMY     Social History   Occupational History   Not on file  Tobacco Use   Smoking status: Never   Smokeless tobacco: Never  Vaping Use   Vaping status: Never  Used  Substance and Sexual Activity   Alcohol use: Yes    Alcohol/week: 4.0 - 8.0 standard drinks of alcohol    Types: 4 - 8 Cans of beer per week    Comment: heavy on weekend   Drug use: No   Sexual activity: Yes

## 2023-01-15 ENCOUNTER — Ambulatory Visit: Payer: 59 | Admitting: Nurse Practitioner

## 2023-01-21 ENCOUNTER — Telehealth: Payer: Self-pay | Admitting: Cardiology

## 2023-01-21 ENCOUNTER — Telehealth: Payer: Self-pay | Admitting: *Deleted

## 2023-01-21 DIAGNOSIS — I4719 Other supraventricular tachycardia: Secondary | ICD-10-CM

## 2023-01-21 DIAGNOSIS — I491 Atrial premature depolarization: Secondary | ICD-10-CM

## 2023-01-21 DIAGNOSIS — I480 Paroxysmal atrial fibrillation: Secondary | ICD-10-CM

## 2023-01-21 MED ORDER — METOPROLOL SUCCINATE ER 25 MG PO TB24: 25.0000 mg | ORAL_TABLET | Freq: Every day | ORAL | 3 refills | Status: DC

## 2023-01-21 NOTE — Telephone Encounter (Signed)
-----   Message from Armanda Magic sent at 01/19/2023  7:28 PM EDT ----- Heart monitor showed normal rhythm with rare extra heart beats from the top of the heart called PACs.  These are benign.  He also had rare extra heart beats from the bottom of the heart, again benign.  There were 2 other episodes of a fast heart beat that may be atrial fibrillation lasting 15 beats and runs of fast heart beat from the top of the heart lasting up to 16 beats at a time which is  a more organized rhythm from atrial fibrillation called atrial tachycardia.  Please refer to EP.  Please get a 2D echo to assess LVF.Please have him come in for BMET, Mag and TSH.  Start Toprol XL 25mg  daily. Continue Cardizem CD.

## 2023-01-21 NOTE — Telephone Encounter (Signed)
Left message for patient to call back.  Referral to EP has been placed so that we can get that scheduled.

## 2023-01-21 NOTE — Telephone Encounter (Signed)
I advised the pt his Echo results and he verbalized understanding.

## 2023-01-21 NOTE — Telephone Encounter (Signed)
Patient called to have a conversation with Dr. Mayford Knife or nurse about his heart monitor. Please call back

## 2023-01-23 NOTE — Telephone Encounter (Signed)
Call to patient who has Echo/labs scheduled for 02/06/23. Call to [facility] as toprol ws ordered but patient has not yet received. Erskine Squibb at [facility] states it will be filled today, message relayed to patient. Patient verbalizes understanding he has been referred to EP as well.

## 2023-02-06 ENCOUNTER — Ambulatory Visit (HOSPITAL_COMMUNITY): Payer: 59 | Attending: Cardiology

## 2023-02-06 ENCOUNTER — Ambulatory Visit: Payer: 59

## 2023-02-06 DIAGNOSIS — I480 Paroxysmal atrial fibrillation: Secondary | ICD-10-CM | POA: Diagnosis present

## 2023-02-06 DIAGNOSIS — I491 Atrial premature depolarization: Secondary | ICD-10-CM | POA: Insufficient documentation

## 2023-02-06 DIAGNOSIS — I4719 Other supraventricular tachycardia: Secondary | ICD-10-CM | POA: Diagnosis not present

## 2023-02-06 LAB — BASIC METABOLIC PANEL
BUN/Creatinine Ratio: 11 (ref 10–24)
BUN: 13 mg/dL (ref 8–27)
CO2: 26 mmol/L (ref 20–29)
Calcium: 9.6 mg/dL (ref 8.6–10.2)
Chloride: 101 mmol/L (ref 96–106)
Creatinine, Ser: 1.16 mg/dL (ref 0.76–1.27)
Glucose: 70 mg/dL (ref 70–99)
Potassium: 4.4 mmol/L (ref 3.5–5.2)
Sodium: 138 mmol/L (ref 134–144)
eGFR: 70 mL/min/{1.73_m2} (ref >59)

## 2023-02-06 LAB — ECHOCARDIOGRAM COMPLETE
Area-P 1/2: 3.65 cm2
S' Lateral: 2.6 cm

## 2023-02-06 LAB — MAGNESIUM: Magnesium: 1.9 mg/dL (ref 1.6–2.3)

## 2023-02-06 LAB — TSH: TSH: 1.56 u[IU]/mL (ref 0.450–4.500)

## 2023-02-09 ENCOUNTER — Telehealth: Payer: Self-pay

## 2023-02-09 NOTE — Telephone Encounter (Signed)
Call to discuss echo results, no answer. Left detailed message per DPR explaining pumping function of his heart is normal. Advised he does have some increased stiffness of the heart muscle called diastolic dysfunction related to age and hypertension and the left atrium is mildly enlarged and the aortic valve is mildly calcified. Advised patient to call our office if any questions.

## 2023-02-09 NOTE — Telephone Encounter (Signed)
-----   Message from Armanda Magic sent at 02/06/2023 10:29 AM EDT ----- Pumping which function of his heart is normal.  He does have some increased stiffness of the heart muscle called diastolic dysfunction related to age and hypertension.  The left atrium is mildly enlarged and the aortic valve is mildly calcified.

## 2023-02-10 ENCOUNTER — Telehealth: Payer: Self-pay | Admitting: Cardiology

## 2023-02-10 NOTE — Telephone Encounter (Signed)
Pt returning nurses phone call. Please advise ?

## 2023-02-10 NOTE — Telephone Encounter (Signed)
Spoke with pt. Pt stated he was clear on the message about the results but he was wanting to know if the referral to EP was still appropriate and if he should keep his Appt with Dr Elberta Fortis. Told pt we should keep appt for now and I would forward for Dr Malachy Mood review.

## 2023-02-10 NOTE — Telephone Encounter (Signed)
Left detailed message per DPR to keep follow up appointment as scheduled.

## 2023-02-11 ENCOUNTER — Ambulatory Visit: Payer: 59 | Admitting: Vascular Surgery

## 2023-02-11 ENCOUNTER — Encounter (HOSPITAL_COMMUNITY): Payer: 59

## 2023-02-12 ENCOUNTER — Ambulatory Visit: Payer: 59 | Admitting: Adult Health

## 2023-02-12 ENCOUNTER — Telehealth: Payer: Self-pay

## 2023-02-12 NOTE — Telephone Encounter (Signed)
-----   Message from Armanda Magic sent at 02/09/2023  6:11 PM EDT ----- Please let patient know that labs were normal.  Continue current medical therapy.

## 2023-02-12 NOTE — Telephone Encounter (Signed)
Patient verbalizing understanding that labs are normal and to continue current meds.

## 2023-03-12 ENCOUNTER — Ambulatory Visit: Payer: 59 | Attending: Cardiology | Admitting: Cardiology

## 2023-03-12 ENCOUNTER — Encounter: Payer: Self-pay | Admitting: Cardiology

## 2023-03-12 VITALS — BP 120/88 | HR 58 | Ht 69.0 in | Wt 190.2 lb

## 2023-03-12 DIAGNOSIS — I491 Atrial premature depolarization: Secondary | ICD-10-CM | POA: Diagnosis not present

## 2023-03-12 DIAGNOSIS — I480 Paroxysmal atrial fibrillation: Secondary | ICD-10-CM | POA: Diagnosis not present

## 2023-03-12 DIAGNOSIS — I4719 Other supraventricular tachycardia: Secondary | ICD-10-CM | POA: Diagnosis not present

## 2023-03-12 NOTE — Patient Instructions (Addendum)
Medication Instructions:  Your physician recommends that you continue on your current medications as directed. Please refer to the Current Medication list given to you today.  *If you need a refill on your cardiac medications before your next appointment, please call your pharmacy*   Lab Work: None ordered If you have labs (blood work) drawn today and your tests are completely normal, you will receive your results only by: MyChart Message (if you have MyChart) OR A paper copy in the mail If you have any lab test that is abnormal or we need to change your treatment, we will call you to review the results.   Testing/Procedures: None ordered   Follow-Up: At [facility], you and your health needs are our priority.  As part of our continuing mission to provide you with exceptional heart care, we have created designated Provider Care Teams.  These Care Teams include your primary Cardiologist (physician) and Advanced Practice Providers (APPs -  Physician Assistants and Nurse Practitioners) who all work together to provide you with the care you need, when you need it.  We recommend signing up for the patient portal called "MyChart".  Sign up information is provided on this After Visit Summary.  MyChart is used to connect with patients for Virtual Visits (Telemedicine).  Patients are able to view lab/test results, encounter notes, upcoming appointments, etc.  Non-urgent messages can be sent to your provider as well.   To learn more about what you can do with MyChart, go to [email or web address].    Your next appointment:   To   be determined   The format for your next appointment:   In Person  Provider:   Loman Brooklyn, MD    Thank you for choosing [facility]!!   Dory Horn, RN [phone]  Other Instructions  Please let the office know if you would like to proceed or not:  Cardiac Ablation Cardiac ablation is a procedure to destroy (ablate) heart tissue that is  sending bad signals. These bad signals cause the heart to beat very fast or in a way that is not normal. Destroying some tissues can help make the heart rhythm normal. Tell your doctor about: Any allergies you have. All medicines you are taking. These include vitamins, herbs, eye drops, creams, and over-the-counter medicines. Any problems you or family members have had with anesthesia. Any bleeding problems you have. Any surgeries you have had. Any medical conditions you have. Whether you are pregnant or may be pregnant. What are the risks? Your doctor will talk with you about risks. These may include: Infection. Bruising and bleeding. Stroke or blood clots. Damage to nearby areas of your body. Allergies to medicines or dyes. Needing a pacemaker if the heart gets damaged. A pacemaker helps the heart beat normally. The procedure not working. What happens before the procedure? Medicines Ask your doctor about changing or stopping: Your normal medicines. Vitamins, herbs, and supplements. Over-the-counter medicines. Do not take aspirin or ibuprofen unless you are told to. General instructions Follow instructions from your doctor about what you may eat and drink. If you will be going home right after the procedure, plan to have a responsible adult: Take you home from the hospital or clinic. You will not be allowed to drive. Care for you for the time you are told. Ask your doctor what steps will be taken to prevent the spread of germs. What happens during the procedure?  An IV tube will be put into one of your veins. You may  be given: A sedative. This helps you relax. Anesthesia. This will: Numb certain areas of your body. The skin on your neck or groin will be numbed. A cut (incision) will be made in your neck or groin. A needle will be put through the cut and into a large vein. The small, thin tube (catheter) will be put into the needle. The tube will be moved to your heart. A  type of X-ray (fluoroscopy) will be used to help guide the tube. It will also show constant images of the heart on a screen. Dye may be put through the tube. This helps your doctor see your heart. An electric current will be sent from the tube to destroy heart tissue in certain areas. The tube will be taken out. Pressure will be held on your cut. This helps stop bleeding. A bandage (dressing) will be put over your cut. The procedure may vary among doctors and hospitals. What happens after the procedure? You will be monitored until you leave the hospital or clinic. This includes checking your blood pressure, heart rate and rhythm, breathing rate, and blood oxygen level. Your cut will be checked for bleeding. You will need to lie still for a few hours. If your groin was used, you will need to keep your leg straight for a few hours after the small, thin tube is removed. This information is not intended to replace advice given to you by your health care provider. Make sure you discuss any questions you have with your health care provider. Document Revised: 10/01/2021 Document Reviewed: 10/01/2021 Elsevier Patient Education  2024 [employer].

## 2023-03-12 NOTE — Progress Notes (Signed)
Electrophysiology Office Note:   Date:  03/12/2023  ID:  Arizona Constable, DOB 02-08-1961, MRN [id number]  Primary Cardiologist: Armanda Magic, MD Electrophysiologist: None      History of Present Illness:   JUEL MCCUNE is a 62 y.o. male with h/o hypertension, atrial fibrillation, hyperlipidemia, DVT with factor V Leiden seen today for  for Electrophysiology evaluation of atrial fibrillation at the request of Carolanne Grumbling.    He has palpitations several times a week.  At times, palpitations last for many hours.  He has both long and short episodes of atrial fibrillation.  When he is having his short episodes, he has no major complaints.  When he has his long episodes, he feels significant fatigue, shortness of breath, weakness.  He is quite concerned about his episodes.  When he is not in atrial fibrillation, he does have some fatigue and weakness.  He Attie Nawabi take his diltiazem metoprolol at night.   Review of systems complete and found to be negative unless listed in HPI.   EP Information / Studies Reviewed:    EKG is ordered today. Personal review as below.  EKG Interpretation Date/Time:  Thursday March 12 2023 14:25:29 EST Ventricular Rate:  58 PR Interval:  166 QRS Duration:  86 QT Interval:  436 QTC Calculation: 428 R Axis:   75  Text Interpretation: Sinus bradycardia When compared with ECG of 16-Dec-2022 08:24, No significant change was found Confirmed by Mak Bonny ([id number]) on 03/12/2023 2:31:07 PM     Risk Assessment/Calculations:    CHA2DS2-VASc Score = 1   This indicates a 0.6% annual risk of stroke. The patient's score is based upon: CHF History: 0 HTN History: 1 Diabetes History: 0 Stroke History: 0 Vascular Disease History: 0 Age Score: 0 Gender Score: 0              Physical Exam:   VS:  BP 120/88 (BP Location: Left Arm, Patient Position: Sitting, Cuff Size: Large)   Pulse (!) 58   Ht 5\' 9"  (1.753 m)   Wt 190 lb 3.2 oz (86.3 kg)   SpO2  96%   BMI 28.09 kg/m    Wt Readings from Last 3 Encounters:  03/12/23 190 lb 3.2 oz (86.3 kg)  12/16/22 180 lb (81.6 kg)  11/12/22 181 lb 4.8 oz (82.2 kg)     GEN: Well nourished, well developed in no acute distress NECK: No JVD; No carotid bruits CARDIAC: Regular rate and rhythm, no murmurs, rubs, gallops RESPIRATORY:  Clear to auscultation without rales, wheezing or rhonchi  ABDOMEN: Soft, non-tender, non-distended EXTREMITIES:  No edema; No deformity   ASSESSMENT AND PLAN:    1.  Paroxysmal atrial fibrillation: Currently on diltiazem.  He would likely benefit from rhythm control.  He is unclear as to his rhythm control method.  We discussed ablation versus medical management with flecainide.  He [place] Lesure think about this and let us know which she would prefer.  All the risk and benefits of ablation were discussed.  Risk, benefits, and alternatives to EP study and radiofrequency/pulse field ablation for afib were also discussed in detail today. These risks include but are not limited to stroke, bleeding, vascular damage, tamponade, perforation, damage to the esophagus, lungs, and other structures, pulmonary vein stenosis, worsening renal function, and death. The patient understands these risk and wishes to proceed.  We Jozalyn Baglio therefore proceed with catheter ablation at the next available time.  Carto, ICE, anesthesia are requested for the procedure.  Donte Lenzo also obtain  CT PV protocol prior to the procedure to exclude LAA thrombus and further evaluate atrial anatomy.  2.  Hypertension: Currently well-controlled  Follow up with Dr. Elberta Fortis as usual post procedure  Signed, Deyani Hegarty Jorja Loa, MD

## 2023-03-24 ENCOUNTER — Telehealth: Payer: Self-pay | Admitting: Cardiology

## 2023-03-24 NOTE — Telephone Encounter (Signed)
Pt is calling to schedule his ablation

## 2023-03-24 NOTE — Telephone Encounter (Signed)
Left detailed message informing pt that office would be in contact to arrange.  Informed that it may be several weeks before hearing from [place] to schedule procedure.

## 2023-04-10 NOTE — Progress Notes (Unsigned)
Assessment/Plan:    LLE rest tremor Discussed again with patient that he does not meet criteria for Parkinson's disease currently, but certainly that may be coming in the future, especially with abnormal DaTscan.  However, currently the only feature that he has is left lower extremity rest tremor.  He has no bradykinesia, which is required for this diagnosis.   DaTscan done December, 2023 with decreased radiotracer activity in the right stratum compared to that of the left.  Reviewed that today. Discussed the importance of safe, cardiovascular exercise.  Discussed exactly what this means and the goals of exercise today. He asked about alcohol.  He has been decreasing it.  We talked about perhaps having a goal of alcohol only 2 days/week.  He and his girlfriend thought that was a reasonable goal. We discussed Mediterranean/MIND diet   2.  osas             -noncompliant with cpap             -This, along with nocturia, may be contributing to restless sleep.   3.  Possible early rbd             -Noted primarily by the patient's girlfriend.  Discussed that this can be a premotor sign to Parkinson's disease, but doesn't allow [place] to dx it any earlier  4.  A-fib, DVT, Factor V leiden  -on coumadin  -ablation pending Subjective:   Steven Conley was seen today in follow up for tremor with parkinsonian features.  Patient has had an abnormal DaTscan, but has not met clinical criteria for Parkinson's disease up to this point.  We have been monitoring him for this.  Since our last visit, the patient reports he has had no falls.  No lightheadedness or near syncope.  He has seen cardiology since our last visit regarding A-fib and they have discussed ablation.    ALLERGIES:   Allergies  Allergen Reactions   Amoxicillin Other (See Comments)    Pt unsure of reaction    Dexilant [Dexlansoprazole] Other (See Comments)    Stomach issues    Lexapro [Escitalopram] Other (See Comments)    Stomach  Issues    Sulfa Drugs Cross Reactors Hives   Zoloft [Sertraline Hcl] Other (See Comments)    Stomach issues     CURRENT MEDICATIONS:  No outpatient medications have been marked as taking for the 04/14/23 encounter (Appointment) with Judianne Seiple, Octaviano Batty, DO.     Objective:   PHYSICAL EXAMINATION:    VITALS:   There were no vitals filed for this visit.   GEN:  The patient appears stated age and is in NAD. HEENT:  Normocephalic, atraumatic.  The mucous membranes are moist. The superficial temporal arteries are without ropiness or tenderness. CV:  RRR Lungs:  CTAB Neck/HEME:  There are no carotid bruits bilaterally.  Neurological examination:  Orientation: The patient is alert and oriented x3.  Cranial nerves: There is good facial symmetry.  The speech is fluent and clear. Soft palate rises symmetrically and there is no tongue deviation. Hearing is intact to conversational tone. Sensation: Sensation is intact to light touch throughout  Motor: Strength is at least antigravity x 4.    Movement examination: Tone: There is normal tone in the bilateral upper extremities.  The tone in the lower extremities is normal.  Abnormal movements: there is LLE rest tremor.  This does not change with distraction procedures.  No upper extremity tremor, even with distraction procedures.  This is similar to last visit. Coordination:  There is no decremation with RAM's, with any form of RAMS, including alternating supination and pronation of the forearm, hand opening and closing, finger taps, heel taps and toe taps.  Gait and Station: The patient easily arises out of the chair.  He ambulates well in the hall.  There is no shuffling.  He is not short stepped.  I have reviewed and interpreted the following labs independently    Chemistry      Component Value Date/Time   NA 138 02/06/2023 0816   K 4.4 02/06/2023 0816   CL 101 02/06/2023 0816   CO2 26 02/06/2023 0816   BUN 13 02/06/2023 0816    CREATININE 1.16 02/06/2023 0816      Component Value Date/Time   CALCIUM 9.6 02/06/2023 0816   ALKPHOS 56 09/07/2012 1711   AST 38 (H) 09/07/2012 1711   ALT 93 (H) 09/07/2012 1711   BILITOT 0.6 09/07/2012 1711       Lab Results  Component Value Date   WBC 14.0 (H) 12/05/2021   HGB 14.5 12/05/2021   HCT 41.6 12/05/2021   MCV 90.8 12/05/2021   PLT 115 (L) 12/05/2021    Lab Results  Component Value Date   TSH 1.560 02/06/2023     Total time spent on today's visit was *** minutes, including both face-to-face time and nonface-to-face time.  Time included that spent on review of records (prior notes available to me/labs/imaging if pertinent), discussing treatment and goals, answering patient's questions and coordinating care.  Cc:  Emilio Aspen, MD

## 2023-04-14 ENCOUNTER — Telehealth: Payer: Self-pay | Admitting: Cardiology

## 2023-04-14 ENCOUNTER — Ambulatory Visit: Payer: 59 | Admitting: Neurology

## 2023-04-14 ENCOUNTER — Encounter: Payer: Self-pay | Admitting: Neurology

## 2023-04-14 VITALS — BP 132/84 | HR 60

## 2023-04-14 DIAGNOSIS — G20C Parkinsonism, unspecified: Secondary | ICD-10-CM

## 2023-04-14 NOTE — Telephone Encounter (Signed)
Patient is asking the nurse give him a call back, about the procedure. Please advise

## 2023-04-14 NOTE — Telephone Encounter (Signed)
Updated I am awaiting hospital to call back.  He understands I will call one I speak w/ them.  Patient agreeable to plan.

## 2023-04-14 NOTE — Telephone Encounter (Signed)
Apologized for still not calling, aware we are still working on things.  Patient is fine, he was just checking in on things.  Aware we will be in touch, soon hopefully.   Patient agreeable to plan.

## 2023-04-16 NOTE — Telephone Encounter (Signed)
Followed up with pt and informed him of preservice information about procedure cost. Aware that it would be less expensive w/ Medicare.  Patient would prefer to wait and schedule it once he switches to Medicare which will be in April.  He is going to look at his schedule and will call us back about rescheduling procedure for April/May.  Will remove him from 07/07/23 spot.

## 2023-06-24 ENCOUNTER — Ambulatory Visit: Payer: 59 | Admitting: Orthopedic Surgery

## 2023-07-29 ENCOUNTER — Ambulatory Visit: Payer: 59 | Admitting: Orthopedic Surgery

## 2023-07-29 ENCOUNTER — Encounter: Payer: Self-pay | Admitting: Orthopedic Surgery

## 2023-07-29 ENCOUNTER — Other Ambulatory Visit (INDEPENDENT_AMBULATORY_CARE_PROVIDER_SITE_OTHER)

## 2023-07-29 DIAGNOSIS — Z96651 Presence of right artificial knee joint: Secondary | ICD-10-CM | POA: Diagnosis not present

## 2023-07-29 NOTE — Progress Notes (Signed)
 Office Visit Note   Patient: Steven Conley           Date of Birth: 17-Dec-1960           MRN: [id number] Visit Date: 07/29/2023 Requested by: Emilio Aspen, MD [address] [place],  [place] [address] PCP: Emilio Aspen, MD  Subjective: No chief complaint on file.   HPI: Steven Conley is a 63 y.o. male who presents to the office reporting mild swelling in the right knee.  He had right total knee replacement 12/03/2021.  Since he was last seen he has retired.  He has been sailing.  Denies any fevers or chills..                ROS: All systems reviewed are negative as they relate to the chief complaint within the history of present illness.  Patient denies fevers or chills.  Assessment & Plan: Visit Diagnoses: No diagnosis found.  Plan: Impression is mild effusion in the right knee.  Last year's radiographs look good.  Range of motion is excellent.  Clinically this does not really have any evidence of infection.  I think he is got slightly more effusion than normal but still well within normal limits for an active person.  He will follow-up with [place] as needed.  Follow-Up Instructions: No follow-ups on file.   Orders:  No orders of the defined types were placed in this encounter.  No orders of the defined types were placed in this encounter.     Procedures: No procedures performed   Clinical Data: No additional findings.  Objective: Vital Signs: There were no vitals taken for this visit.  Physical Exam:  Constitutional: Patient appears well-developed HEENT:  Head: Normocephalic Eyes:EOM are normal Neck: Normal range of motion Cardiovascular: Normal rate Pulmonary/chest: Effort normal Neurologic: Patient is alert Skin: Skin is warm Psychiatric: Patient has normal mood and affect  Ortho Exam: Ortho exam demonstrates normal gait alignment.  Has some chronic swelling in that right lower extremity due to known history of DVT is  chronic on that side.  Trace effusion present in that right knee which is about at most 10 cc.  Extensor mechanism intact and nontender.  Collaterals are stable.  No coarse grinding or crepitus with knee range of motion in the patellofemoral joint.  Specialty Comments:  No specialty comments available.  Imaging: No results found.   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) 05/16/2014   Ejection fraction    PAF (paroxysmal atrial fibrillation) (HCC) 01/09/2013   Warfarin anticoagulation 01/09/2013   Spondylosis, cervical, with myelopathy 11/16/2012   Factor V Leiden mutation (HCC) 09/07/2012   ARF (acute renal failure) (HCC) 09/07/2012   SOB (shortness of breath) 09/07/2012   Chest discomfort 09/07/2012   HTN (hypertension) 09/07/2012   GERD (gastroesophageal reflux disease) 09/07/2012   HLD (hyperlipidemia) 09/07/2012   History of DVT of lower extremity 09/07/2012   Internal hemorrhoids with complication 01/27/2011   Past Medical History:  Diagnosis Date   Blood dyscrasia    Cancer (HCC)    squamous cell   Chronic kidney disease    acute renal failure no dialysis due to dehydration   Colon polyp  DVT (deep venous thrombosis) (HCC)    Dysrhythmia    Ejection fraction    .   GERD (gastroesophageal reflux disease)    Hemorrhoid    Homozygous Factor V Leiden mutation (HCC)    Dr. Trinna Post   Hyperlipidemia    Hypertension    not currently on BP medication   PAT (paroxysmal atrial tachycardia) (HCC)    PONV (postoperative nausea and vomiting)    no problems with Propofol use   Sleep apnea 2015   uses CPAP intermittently    Family History  Problem Relation Age of Onset   Cancer Mother 27       breast   Cancer  Sister        breast   Cerebral aneurysm Sister    Cancer Brother        stomach   Cancer Maternal Aunt        breast    Past Surgical History:  Procedure Laterality Date   ANTERIOR CERVICAL DECOMP/DISCECTOMY FUSION N/A 11/16/2012   Procedure: ANTERIOR CERVICAL DECOMPRESSION/DISCECTOMY FUSION 2 LEVELS;  Surgeon: Temple Pacini, MD;  Location: [facility];  Service: Neurosurgery;  Laterality: N/A;  Cervical four-five, cervical five-six anterior cervical decompression fusion with PEEK and plate    BICEPS TENDON REPAIR     right   BRAVO PH STUDY  03/10/2012   Procedure: BRAVO PH STUDY;  Surgeon: Willis Modena, MD;  Location: [facility];  Service: Endoscopy;  Laterality: N/A;   ESOPHAGOGASTRODUODENOSCOPY (EGD) WITH PROPOFOL  03/10/2012   Procedure: ESOPHAGOGASTRODUODENOSCOPY (EGD) WITH PROPOFOL;  Surgeon: Willis Modena, MD;  Location: [facility];  Service: Endoscopy;  Laterality: N/A;   EYE SURGERY Bilateral    lasik   FRACTURE SURGERY     left ankle- bursectomy   HEMORROIDECTOMY  1995   KNEE ARTHROSCOPY     right   LAPAROSCOPIC APPENDECTOMY N/A 04/25/2014   Procedure: APPENDECTOMY LAPAROSCOPIC;  Surgeon: Emelia Loron, MD;  Location: [facility] OR;  Service: General;  Laterality: N/A;   NECK SURGERY     NOSE SURGERY  1972, 1985   SHOULDER SURGERY     right   TOTAL KNEE ARTHROPLASTY Right 12/03/2021   Procedure: RIGHT TOTAL KNEE ARTHROPLASTY;  Surgeon: Cammy Copa, MD;  Location: [facility];  Service: Orthopedics;  Laterality: Right;   UMBILICAL HERNIA REPAIR N/A 04/25/2014   Procedure: HERNIA REPAIR UMBILICAL ADULT (PRIMARY);  Surgeon: Emelia Loron, MD;  Location: [facility] OR;  Service: General;  Laterality: N/A;   VASECTOMY     Social History   Occupational History   Not on file  Tobacco Use   Smoking status: Never   Smokeless tobacco: Never  Vaping Use   Vaping status: Never Used  Substance and Sexual Activity   Alcohol use: Yes    Alcohol/week: 4.0 - 8.0 standard  drinks of alcohol    Types: 4 - 8 Cans of beer per week    Comment: heavy on weekend   Drug use: No   Sexual activity: Yes

## 2023-08-18 DIAGNOSIS — D6869 Other thrombophilia: Secondary | ICD-10-CM | POA: Diagnosis not present

## 2023-08-18 DIAGNOSIS — Z7901 Long term (current) use of anticoagulants: Secondary | ICD-10-CM | POA: Diagnosis not present

## 2023-08-18 DIAGNOSIS — E291 Testicular hypofunction: Secondary | ICD-10-CM | POA: Diagnosis not present

## 2023-08-18 DIAGNOSIS — I10 Essential (primary) hypertension: Secondary | ICD-10-CM | POA: Diagnosis not present

## 2023-08-19 ENCOUNTER — Ambulatory Visit: Payer: 59 | Admitting: Neurology

## 2023-09-23 ENCOUNTER — Other Ambulatory Visit (HOSPITAL_COMMUNITY): Payer: Self-pay

## 2023-09-23 MED ORDER — TESTOSTERONE 50 MG/5GM (1%) TD GEL
TRANSDERMAL | 3 refills | Status: DC
  Filled 2023-09-23: qty 150, 30d supply, fill #0
  Filled 2023-10-26: qty 150, 30d supply, fill #1
  Filled 2023-11-30: qty 150, 30d supply, fill #2
  Filled 2023-12-31: qty 150, 30d supply, fill #3

## 2023-09-24 ENCOUNTER — Other Ambulatory Visit (HOSPITAL_COMMUNITY): Payer: Self-pay

## 2023-10-06 DIAGNOSIS — I480 Paroxysmal atrial fibrillation: Secondary | ICD-10-CM | POA: Diagnosis not present

## 2023-10-06 DIAGNOSIS — Z86718 Personal history of other venous thrombosis and embolism: Secondary | ICD-10-CM | POA: Diagnosis not present

## 2023-10-08 NOTE — Progress Notes (Signed)
 Assessment/Plan:   Parkinsons disease, mild DaTscan  done December, 2023 with decreased radiotracer activity in the right stratum compared to that of the left.  Reviewed that again today. Discussed the importance of safe, cardiovascular exercise.  Discussed exactly what this means and the goals of exercise today. Discussed alcohol again and we discussed need to decrease the medication. Add carbidopa/levodopa 25/100 and work to 1 po tid.  Discussed extensively r/b/se.  Understanding expressed.   2.  osas             -noncompliant with cpap             -This, along with nocturia, may be contributing to restless sleep.   3.  Possible early rbd             -Noted primarily by the patient's (now ex) girlfriend.  Discussed that this can be a premotor sign to Parkinson's disease, but doesn't allow us  to dx it any earlier  4.  A-fib, DVT, Factor V leiden  -on coumadin   -Pt wanted to wait on ablation until [facility] kicked in (which it has now) and was to call cardiology to r/s.  5.  Htn  -discussed concept of permissive HTN in Parkinsons Disease but told him may need his antihypertensives for his cardiac issues.  He can discuss with cardiologist.  6.  GAD  -discussed finding counselor.  He will let me know if he wants a referral Subjective:   Steven Conley was seen today in follow up for tremor with parkinsonian features.    Patient has had an abnormal DaTscan , but has not met clinical criteria for Parkinson's disease up to this point.  We have been monitoring him for this.  Since our last visit, the patient reports he has had no falls.  No lightheadedness or near syncope.  He has seen his primary care since last visit and BuSpar was started for anxiety.  His wellbutrin has been d/c.  He isn't sure its been helpful.  He asks about starting med for parkinsonism.  He is bothered by the tremor on the L.  Only fall was off of the boat - but he did slip.  He is exercising - he retired 3/31 and is  going to gym 4 days/week.  I haven't stopped the beers like I should.    ALLERGIES:   Allergies  Allergen Reactions   Amoxicillin Other (See Comments)    Pt unsure of reaction    Dexilant [Dexlansoprazole] Other (See Comments)    Stomach issues    Lexapro [Escitalopram] Other (See Comments)    Stomach Issues    Sulfa Drugs Cross Reactors Hives   Zoloft [Sertraline Hcl] Other (See Comments)    Stomach issues     CURRENT MEDICATIONS:  Current Meds  Medication Sig   busPIRone (BUSPAR) 5 MG tablet Take 5 mg by mouth 2 (two) times daily.   diltiazem  (CARDIZEM  CD) 300 MG 24 hr capsule Take 1 capsule (300 mg total) by mouth daily.   lisinopril  (PRINIVIL ,ZESTRIL ) 10 MG tablet Take 10 mg by mouth daily.   metoprolol  succinate (TOPROL  XL) 25 MG 24 hr tablet Take 1 tablet (25 mg total) by mouth daily.   omeprazole (PRILOSEC) 40 MG capsule Take 40 mg by mouth daily.   Propylene Glycol (SYSTANE COMPLETE) 0.6 % SOLN Place 1 drop into both eyes daily as needed (dry eyes).   tadalafil (CIALIS) 5 MG tablet Take 5 mg by mouth daily as needed.  testosterone  (ANDROGEL ) 50 MG/5GM (1%) GEL Place 5 g onto the skin daily.   testosterone  (ANDROGEL ) 50 MG/5GM (1%) GEL Apply 1 packet to skin in the morning to shoulder, upper arms or abdomen.   valACYclovir (VALTREX) 1000 MG tablet Take 1,000 mg by mouth daily.   vitamin B-12 (CYANOCOBALAMIN ) 1000 MCG tablet Take 1,000 mcg by mouth daily.   warfarin (COUMADIN ) 5 MG tablet Take 2.5-5 mg by mouth See admin instructions. Per patient taking 5 mg on Monday's and taking 2.5 mg Tues.,Wed., Thurs., Fri., Sat., and Sun.     Objective:   PHYSICAL EXAMINATION:    VITALS:   Vitals:   10/13/23 0806  BP: 118/80  Pulse: (!) 51  SpO2: 97%  Weight: 193 lb 9.6 oz (87.8 kg)    GEN:  The patient appears stated age and is in NAD. HEENT:  Normocephalic, atraumatic.  The mucous membranes are moist. The superficial temporal arteries are without ropiness or  tenderness. CV:  RRR Lungs:  CTAB Neck/HEME:  There are no carotid bruits bilaterally.  Neurological examination:  Orientation: The patient is alert and oriented x3.  Cranial nerves: There is good facial symmetry.  The speech is fluent and clear. Soft palate rises symmetrically and there is no tongue deviation. Hearing is intact to conversational tone. Sensation: Sensation is intact to light touch throughout  Motor: Strength is at least antigravity x 4.   Movement examination: Tone: There is mild increased tone in the LLE Abnormal movements: there is LLE rest tremor and mild RLE rest tremor Coordination:  There is no decremation with RAM's, with any form of RAMS, including alternating supination and pronation of the forearm, hand opening and closing, finger taps, heel taps and toe taps.  Gait and Station: The patient easily arises out of the chair.  He ambulates well in the hall.  There is no shuffling.  He is not short stepped.  He initially holds his left arm flexed but then lets it down and has decreased arm swing bilaterally.  This is similar to previous   Total time spent on today's visit was 50 minutes, including both face-to-face time and nonface-to-face time.  Time included that spent on review of records (prior notes available to me/labs/imaging if pertinent), discussing treatment and goals, answering patient's questions and coordinating care.  Cc:  Benedetta Bradley, MD

## 2023-10-13 ENCOUNTER — Ambulatory Visit: Payer: 59 | Admitting: Neurology

## 2023-10-13 VITALS — BP 118/80 | HR 51 | Wt 193.6 lb

## 2023-10-13 DIAGNOSIS — G20A1 Parkinson's disease without dyskinesia, without mention of fluctuations: Secondary | ICD-10-CM | POA: Diagnosis not present

## 2023-10-13 DIAGNOSIS — F411 Generalized anxiety disorder: Secondary | ICD-10-CM

## 2023-10-13 DIAGNOSIS — I10 Essential (primary) hypertension: Secondary | ICD-10-CM | POA: Diagnosis not present

## 2023-10-13 DIAGNOSIS — D6869 Other thrombophilia: Secondary | ICD-10-CM | POA: Diagnosis not present

## 2023-10-13 DIAGNOSIS — F419 Anxiety disorder, unspecified: Secondary | ICD-10-CM | POA: Diagnosis not present

## 2023-10-13 DIAGNOSIS — Z7901 Long term (current) use of anticoagulants: Secondary | ICD-10-CM | POA: Diagnosis not present

## 2023-10-13 MED ORDER — CARBIDOPA-LEVODOPA 25-100 MG PO TABS: 1.0000 | ORAL_TABLET | Freq: Three times a day (TID) | ORAL | 1 refills | Status: DC

## 2023-10-13 NOTE — Patient Instructions (Addendum)
 Decrease alcohol to no more than 2 days per week. We discussed finding a Veterinary surgeon.  Let me know if you need a referral  SAVE THE DATE!  We are planning a Parkinsons Disease educational symposium at [facility] in [place] on September 19.  More details to come!  We will have a movement disorder physician expert from [employer] coming to speak and a caregiver speaker.  We will have a panel of experts that will show you who you may need on your team of people on your journey with Parkinsons.  If you would like to be added to our email list to get further information, email [email or web address].  I hope to see you there!   Start Carbidopa Levodopa as follows: Take 1/2 tablet three times daily, at least 30 minutes before meals (approximately 7am/11am/4pm), for one week Then take 1/2 tablet in the morning, 1/2 tablet in the afternoon, 1 tablet in the evening, at least 30 minutes before meals, for one week Then take 1/2 tablet in the morning, 1 tablet in the afternoon, 1 tablet in the evening, at least 30 minutes before meals, for one week Then take 1 tablet three times daily at 7am/11am/4pm, at least 30 minutes before meals   As a reminder, carbidopa/levodopa can be taken at the same time as a carbohydrate, but we like to have you take your pill either 30 minutes before a protein source or 1 hour after as protein can interfere with carbidopa/levodopa absorption.

## 2023-10-27 ENCOUNTER — Other Ambulatory Visit (HOSPITAL_COMMUNITY): Payer: Self-pay

## 2023-10-28 ENCOUNTER — Other Ambulatory Visit (HOSPITAL_COMMUNITY): Payer: Self-pay

## 2023-10-29 ENCOUNTER — Other Ambulatory Visit (HOSPITAL_COMMUNITY): Payer: Self-pay

## 2023-10-31 ENCOUNTER — Other Ambulatory Visit (HOSPITAL_COMMUNITY): Payer: Self-pay

## 2023-11-02 ENCOUNTER — Other Ambulatory Visit (HOSPITAL_COMMUNITY): Payer: Self-pay

## 2023-11-18 DIAGNOSIS — L814 Other melanin hyperpigmentation: Secondary | ICD-10-CM | POA: Diagnosis not present

## 2023-11-18 DIAGNOSIS — D485 Neoplasm of uncertain behavior of skin: Secondary | ICD-10-CM | POA: Diagnosis not present

## 2023-11-18 DIAGNOSIS — D225 Melanocytic nevi of trunk: Secondary | ICD-10-CM | POA: Diagnosis not present

## 2023-11-18 DIAGNOSIS — L821 Other seborrheic keratosis: Secondary | ICD-10-CM | POA: Diagnosis not present

## 2023-11-18 DIAGNOSIS — D2272 Melanocytic nevi of left lower limb, including hip: Secondary | ICD-10-CM | POA: Diagnosis not present

## 2023-11-30 ENCOUNTER — Other Ambulatory Visit (HOSPITAL_COMMUNITY): Payer: Self-pay

## 2023-11-30 DIAGNOSIS — E538 Deficiency of other specified B group vitamins: Secondary | ICD-10-CM | POA: Diagnosis not present

## 2023-11-30 DIAGNOSIS — E7800 Pure hypercholesterolemia, unspecified: Secondary | ICD-10-CM | POA: Diagnosis not present

## 2023-11-30 DIAGNOSIS — F411 Generalized anxiety disorder: Secondary | ICD-10-CM | POA: Diagnosis not present

## 2023-11-30 DIAGNOSIS — Z125 Encounter for screening for malignant neoplasm of prostate: Secondary | ICD-10-CM | POA: Diagnosis not present

## 2023-11-30 DIAGNOSIS — Z23 Encounter for immunization: Secondary | ICD-10-CM | POA: Diagnosis not present

## 2023-11-30 DIAGNOSIS — E291 Testicular hypofunction: Secondary | ICD-10-CM | POA: Diagnosis not present

## 2023-11-30 DIAGNOSIS — I10 Essential (primary) hypertension: Secondary | ICD-10-CM | POA: Diagnosis not present

## 2023-11-30 DIAGNOSIS — Z79899 Other long term (current) drug therapy: Secondary | ICD-10-CM | POA: Diagnosis not present

## 2023-11-30 DIAGNOSIS — Z0000 Encounter for general adult medical examination without abnormal findings: Secondary | ICD-10-CM | POA: Diagnosis not present

## 2023-12-01 ENCOUNTER — Other Ambulatory Visit: Payer: Self-pay

## 2023-12-10 ENCOUNTER — Other Ambulatory Visit: Payer: Self-pay | Admitting: Cardiology

## 2023-12-31 ENCOUNTER — Other Ambulatory Visit: Payer: Self-pay

## 2024-01-11 ENCOUNTER — Other Ambulatory Visit: Payer: Self-pay | Admitting: Cardiology

## 2024-01-28 ENCOUNTER — Other Ambulatory Visit (HOSPITAL_COMMUNITY): Payer: Self-pay

## 2024-01-29 ENCOUNTER — Other Ambulatory Visit (HOSPITAL_COMMUNITY): Payer: Self-pay

## 2024-01-29 MED ORDER — TESTOSTERONE 50 MG/5GM (1%) TD GEL
5.0000 g | Freq: Every day | TRANSDERMAL | 3 refills | Status: AC
  Filled 2024-01-29: qty 150, 30d supply, fill #0
  Filled 2024-02-29: qty 150, 30d supply, fill #1
  Filled 2024-04-01: qty 150, 30d supply, fill #2
  Filled 2024-05-04: qty 150, 30d supply, fill #3

## 2024-02-01 ENCOUNTER — Other Ambulatory Visit: Payer: Self-pay

## 2024-02-10 ENCOUNTER — Other Ambulatory Visit: Payer: Self-pay | Admitting: Cardiology

## 2024-02-29 ENCOUNTER — Encounter: Payer: Self-pay | Admitting: Radiology

## 2024-02-29 ENCOUNTER — Other Ambulatory Visit: Payer: Self-pay

## 2024-02-29 ENCOUNTER — Telehealth: Payer: Self-pay | Admitting: Cardiology

## 2024-02-29 MED ORDER — METOPROLOL SUCCINATE ER 25 MG PO TB24: 25.0000 mg | ORAL_TABLET | Freq: Every day | ORAL | 0 refills | Status: DC

## 2024-02-29 NOTE — Telephone Encounter (Signed)
*  STAT* If patient is at the pharmacy, call can be transferred to refill team.   1. Which medications need to be refilled? (please list name of each medication and dose if known)   metoprolol  succinate (TOPROL -XL) 25 MG 24 hr tablet    2. Which pharmacy/location (including street and city if local pharmacy) is medication to be sent to?  [facility] [place], [place] - [address]      3. Do they need a 30 day or 90 day supply? 30 day   Pt has office visit scheduled for 04/04/24.

## 2024-02-29 NOTE — Telephone Encounter (Signed)
 Pt scheduled to see Orren Fabry, NP, 04/04/24.  Metoprolol  has been filled to get pt to appointment.

## 2024-03-03 ENCOUNTER — Other Ambulatory Visit (HOSPITAL_COMMUNITY): Payer: Self-pay

## 2024-03-09 ENCOUNTER — Other Ambulatory Visit: Payer: Self-pay | Admitting: Neurology

## 2024-03-09 ENCOUNTER — Other Ambulatory Visit: Payer: Self-pay | Admitting: Cardiology

## 2024-03-09 NOTE — Telephone Encounter (Signed)
 Spoke to the pharmacy to make sure patient needed a refill. Per pharmacy patient gets his medication in Blister packs, which he has to do 28/30 day supply. So patient was given a partial fill at the initial fill.  So patient has 54 tabs left on his fill.  Per rep please send in enough to last until his appt in December.

## 2024-03-29 ENCOUNTER — Other Ambulatory Visit: Payer: Self-pay | Admitting: Neurology

## 2024-03-29 ENCOUNTER — Other Ambulatory Visit: Payer: Self-pay | Admitting: Cardiology

## 2024-03-31 NOTE — Progress Notes (Signed)
 Virtual Visit Via Video       Consent was obtained for video visit:  Yes.   Answered questions that patient had about telehealth interaction:  Yes.   I discussed the limitations, risks, security and privacy concerns of performing an evaluation and management service by telemedicine. I also discussed with the patient that there may be a patient responsible charge related to this service. The patient expressed understanding and agreed to proceed.  Pt location: Home Physician Location: office Name of referring provider:  Charlott Dorn LABOR, * I connected with Silver [name] at patients initiation/request on 04/05/2024 at  8:15 AM EST by video enabled telemedicine application and verified that I am speaking with the correct person using two identifiers. Pt MRN:  [id number] Pt DOB:  04-Feb-1961 Video Participants:  Silver [name];    Assessment/Plan:   Parkinsons disease, mild DaTscan  done December, 2023 with decreased radiotracer activity in the right stratum compared to that of the left.  Reviewed that again today. He's doing a great job with exercise and congratulated him Discussed alcohol again and discussed would like to see tapered (or d/c).  Would like to ideally see no more than 2 days/week continue carbidopa /levodopa  25/100 and work to 1 po tid.  Discussed extensively r/b/se.  Understanding expressed.   2.  osas             -noncompliant with cpap             -This, along with nocturia, may be contributing to restless sleep.   3.  Possible early rbd             -Noted primarily by the patient's (now ex) girlfriend.  Discussed that this can be a premotor sign to Parkinson's disease, but doesn't allow us  to dx it any earlier  4.  A-fib, DVT, Factor V leiden  -on coumadin   5.  Htn  -discussed concept of permissive HTN today and last visit in Parkinsons Disease.  PCP is monitoring per last notes  6.  GAD and mild depression  -on buspar and fluoxetine by pcp.   Fluoxetine recently added and buspar increased  -agreeable to sending counseling referral Subjective:   Steven Conley was seen today in follow up for Parkinson's disease.  We started him on levodopa  last visit.  He reports that he is taking the medication and sometimes its helpful and sometimes its not, in terms of tremor.  He has not had falls.  Mild lightheadedness but no near syncope. He thinks that is due to the a-fib as he can wake up with fluttering.   Last visit, I encouraged the patient to attend counseling and told him to let me know if he wanted a referral somewhere.  We did not hear back about that. He called someone he saw previously but that person had retired.  He is exercising 5 days per week - both cardio and core.  He is still drinking alcohol 4-5 beers/day.   Pt also notes that pcp started him on fluotextine, 20 mg and buspar, 10 mg bid.    Current movement disorder medications: Carbidopa /levodopa  25/100, 1 tablet 3 times per day (started last visit)    ALLERGIES:   Allergies  Allergen Reactions   Amoxicillin Other (See Comments)    Pt unsure of reaction    Dexilant [Dexlansoprazole] Other (See Comments)    Stomach issues    Lexapro [Escitalopram] Other (See Comments)    Stomach Issues    Sulfa  Drugs Cross Reactors Hives   Zoloft [Sertraline Hcl] Other (See Comments)    Stomach issues     CURRENT MEDICATIONS:  Current Meds  Medication Sig   albuterol  (VENTOLIN  HFA) 108 (90 Base) MCG/ACT inhaler Inhale 2 puffs into the lungs every 6 (six) hours as needed for wheezing or shortness of breath.   atorvastatin  (LIPITOR) 20 MG tablet Take 20 mg by mouth daily.   busPIRone (BUSPAR) 10 MG tablet Take 10 mg by mouth 2 (two) times daily.   carbidopa -levodopa  (SINEMET  IR) 25-100 MG tablet Take 1 tablet by mouth 3 (three) times daily. 7am/11am/4pm   diltiazem  (CARDIZEM  CD) 300 MG 24 hr capsule TAKE ONE CAPSULE BY MOUTH DAILY   diltiazem  (CARDIZEM ) 30 MG tablet Take 1  tablet (30 mg total) by mouth 4 (four) times daily as needed (fast heart beating).   FLUoxetine (PROZAC) 20 MG capsule Take 1 capsule by mouth daily.   lisinopril  (PRINIVIL ,ZESTRIL ) 10 MG tablet Take 10 mg by mouth daily.   metoprolol  succinate (TOPROL -XL) 25 MG 24 hr tablet Take 1 tablet (25 mg total) by mouth daily.   omeprazole (PRILOSEC) 40 MG capsule Take 40 mg by mouth daily.   Propylene Glycol (SYSTANE COMPLETE) 0.6 % SOLN Place 1 drop into both eyes daily as needed (dry eyes).   tadalafil (CIALIS) 5 MG tablet Take 5 mg by mouth daily as needed.   testosterone  (ANDROGEL ) 50 MG/5GM (1%) GEL Place 5 g onto the skin daily.   valACYclovir (VALTREX) 1000 MG tablet Take 1,000 mg by mouth daily.   vitamin B-12 (CYANOCOBALAMIN ) 1000 MCG tablet Take 1,000 mcg by mouth daily.   warfarin (COUMADIN ) 5 MG tablet Take 2.5-5 mg by mouth See admin instructions. Per patient taking 5 mg on Monday's and taking 2.5 mg Tues.,Wed., Thurs., Fri., Sat., and Sun.     Objective:   PHYSICAL EXAMINATION:    VITALS:   There were no vitals filed for this visit.   GEN:  The patient appears stated age and is in NAD. HEENT:  Normocephalic, atraumatic.    Neurological examination:  Orientation: The patient is alert and oriented x3.  Cranial nerves: There is good facial symmetry.  The speech is fluent and clear.  Hearing is intact to conversational tone. Sensation: Sensation is intact to light touch throughout  Motor: Strength is at least antigravity x 4.   Movement examination:  Abnormal movements: none noted but he was walking in condo much of the visit Coordination:  There is no decremation with RAM's, with any form of RAMS, including alternating supination and pronation of the forearm, hand opening and closing bilaterally Gait and Station:  He ambulates well in the condo with good arm swing b/l   Total time spent on today's visit was 40 minutes, including both face-to-face time and nonface-to-face  time.  Time included that spent on review of records (prior notes available to me/labs/imaging if pertinent), discussing treatment and goals, answering patient's questions and coordinating care.  Cc:  Charlott Dorn LABOR, MD

## 2024-04-01 ENCOUNTER — Other Ambulatory Visit: Payer: Self-pay

## 2024-04-04 ENCOUNTER — Ambulatory Visit: Admitting: Physician Assistant

## 2024-04-05 ENCOUNTER — Telehealth: Admitting: Neurology

## 2024-04-05 DIAGNOSIS — F32A Depression, unspecified: Secondary | ICD-10-CM | POA: Diagnosis not present

## 2024-04-05 DIAGNOSIS — F1090 Alcohol use, unspecified, uncomplicated: Secondary | ICD-10-CM

## 2024-04-05 DIAGNOSIS — G20A1 Parkinson's disease without dyskinesia, without mention of fluctuations: Secondary | ICD-10-CM | POA: Diagnosis not present

## 2024-04-05 DIAGNOSIS — F411 Generalized anxiety disorder: Secondary | ICD-10-CM

## 2024-04-05 DIAGNOSIS — I10 Essential (primary) hypertension: Secondary | ICD-10-CM | POA: Diagnosis not present

## 2024-04-05 MED ORDER — CARBIDOPA-LEVODOPA 25-100 MG PO TABS: 1.0000 | ORAL_TABLET | Freq: Three times a day (TID) | ORAL | 1 refills | Status: AC

## 2024-04-06 DIAGNOSIS — Z7901 Long term (current) use of anticoagulants: Secondary | ICD-10-CM | POA: Diagnosis not present

## 2024-04-10 ENCOUNTER — Other Ambulatory Visit: Payer: Self-pay | Admitting: Cardiology

## 2024-04-10 NOTE — Progress Notes (Unsigned)
 Cardiology Office Note:    Date:  04/11/2024   ID:  Silver LITTIE Steven Conley, DOB 1961-01-19, MRN [id number]  PCP:  Charlott Dorn LABOR, MD   [facility] Providers Cardiologist:  Wilbert Bihari, MD     Referring MD: Charlott Dorn LABOR, MD   Chief complaint: Annual follow-up     History of Present Illness:   Steven Conley is a 63 y.o. male with a hx of HTN, PAF/PAT, dyslipidemia, DVT (with Factor V Leiden), and venous insufficiency, OSA not compliant with CPAP, mild Parkinson's disease (followed by neuro), presenting to the office today for a 1 year follow-up of chronic cardiac conditions.  Diagnosed with A-fib with RVR in 2014.  LVEF 55-60%.  Mild LVH, G1 DD.  Discharged on diltiazem  and warfarin.  Exercise tolerance test in 2018 was negative for ischemia by ST segment analysis with excellent exercise capacity and a hypertensive BP response to exercise.   Patient called the office complaining of increased frequency of A-fib, symptomatic of dizziness, off balance, nausea and vomiting. Lexiscan  Myoview  in 2021: no ST deviation during stress, normal, low risk study, LVEF 55-65%, no evidence of ischemia or infarction.  Echo 2021 with no significant change from prior.  Cardiac event monitor showing sinus rhythm, sinus bradycardia, and sinus tachycardia.  HR ranging from 51-1 39, nonsustained atrial tachycardia present, rare PVCs.  Symptoms controlled on Cardizem  CD to 300 mg daily and warfarin, with 30 mg short acting Cardizem  as needed for increased palpitations.  At last general cardiology office visit in August 2024 he was doing well, referred to EP for discussion of management of further palpitations.  Dr. Inocencio with the EP team evaluated patient on 03/12/2023.  They decided on performing an ablation, however patient wished to wait to schedule until he started Medicare the following year, as it would be more affordable with a new insurance plan.  Presents independently, appears  to be doing well from a cardiovascular standpoint. He denies chest pain, dyspnea, orthopnea, n, v,  dark/tarry/bloody stools, hematuria, dizziness, syncope, edema, weight gain. States he gets brief runs of tachycardia lasting anywhere from 10-60 seconds, aborts spontaneously, occurring around 1-2 times per week. Feels as though this is well managed. Since being dx w/ parkinsons, he's been working out constantly at [employer] He goes 5 days per week, bikes 10 miles and does weight lifting, no cardiovascular symptoms during this time. He has been checking his PT/INR levels and titrating his warfarin from home. His insurance recently stopped covering the cost of the strips, so he's been going to his PCP's office for blood draws every 6 weeks. He's questioning whether there are other options for drug monitoring. Not currently interested in an ablation as he feels his A. Fib/atrial tachycardia symptoms are well controlled with current medications.  ROS:   Please see the history of present illness.    All other systems reviewed and are negative.     Past Medical History:  Diagnosis Date   Blood dyscrasia    Cancer (HCC)    squamous cell   Chronic kidney disease    acute renal failure no dialysis due to dehydration   Colon polyp    DVT (deep venous thrombosis) (HCC)    Dysrhythmia    Ejection fraction    .   GERD (gastroesophageal reflux disease)    Hemorrhoid    Homozygous Factor V Leiden mutation    Dr. [name],PCP-follows   Hyperlipidemia    Hypertension    not  currently on BP medication   PAT (paroxysmal atrial tachycardia)    PONV (postoperative nausea and vomiting)    no problems with Propofol  use   Sleep apnea 2015   uses CPAP intermittently    Past Surgical History:  Procedure Laterality Date   ANTERIOR CERVICAL DECOMP/DISCECTOMY FUSION N/A 11/16/2012   Procedure: ANTERIOR CERVICAL DECOMPRESSION/DISCECTOMY FUSION 2 LEVELS;  Surgeon: Victory DELENA Gunnels, MD;  Location: [facility];   Service: Neurosurgery;  Laterality: N/A;  Cervical four-five, cervical five-six anterior cervical decompression fusion with PEEK and plate    BICEPS TENDON REPAIR     right   BRAVO PH STUDY  03/10/2012   Procedure: BRAVO PH STUDY;  Surgeon: Elsie Cree, MD;  Location: [facility];  Service: Endoscopy;  Laterality: N/A;   ESOPHAGOGASTRODUODENOSCOPY (EGD) WITH PROPOFOL   03/10/2012   Procedure: ESOPHAGOGASTRODUODENOSCOPY (EGD) WITH PROPOFOL ;  Surgeon: Elsie Cree, MD;  Location: [facility];  Service: Endoscopy;  Laterality: N/A;   EYE SURGERY Bilateral    lasik   FRACTURE SURGERY     left ankle- bursectomy   HEMORROIDECTOMY  1995   KNEE ARTHROSCOPY     right   LAPAROSCOPIC APPENDECTOMY N/A 04/25/2014   Procedure: APPENDECTOMY LAPAROSCOPIC;  Surgeon: Donnice Bury, MD;  Location: [facility] OR;  Service: General;  Laterality: N/A;   NECK SURGERY     NOSE SURGERY  1972, 1985   SHOULDER SURGERY     right   TOTAL KNEE ARTHROPLASTY Right 12/03/2021   Procedure: RIGHT TOTAL KNEE ARTHROPLASTY;  Surgeon: Addie Cordella Hamilton, MD;  Location[facility];  Service: Orthopedics;  Laterality: Right;   UMBILICAL HERNIA REPAIR N/A 04/25/2014   Procedure: HERNIA REPAIR UMBILICAL ADULT (PRIMARY);  Surgeon: Donnice Bury, MD;  Location: [facility] OR;  Service: General;  Laterality: N/A;   VASECTOMY      Current Medications: Active Medications[1]   Allergies:   Amoxicillin, Dexilant [dexlansoprazole], Lexapro [escitalopram], Sulfa drugs cross reactors, and Zoloft [sertraline hcl]   Social History   Socioeconomic History   Marital status: Widowed    Spouse name: Not on file   Number of children: 1   Years of education: Not on file   Highest education level: Not on file  Occupational History   Not on file  Tobacco Use   Smoking status: Never   Smokeless tobacco: Never  Vaping Use   Vaping status: Never Used  Substance and Sexual Activity   Alcohol use: Yes    Alcohol/week: 4.0 - 8.0 standard  drinks of alcohol    Types: 4 - 8 Cans of beer per week    Comment: heavy on weekend   Drug use: No   Sexual activity: Yes  Other Topics Concern   Not on file  Social History Narrative   Right handed   Caffeine 2-3 big glasses daily   Lives in two level home   Social Drivers of Health   Tobacco Use: Low Risk (04/11/2024)   Patient History    Smoking Tobacco Use: Never    Smokeless Tobacco Use: Never    Passive Exposure: Not on file  Financial Resource Strain: Not on file  Food Insecurity: Not on file  Transportation Needs: Not on file  Physical Activity: Not on file  Stress: Not on file  Social Connections: Not on file  Depression (EYV7-0): Not on file  Alcohol Screen: Not on file  Housing: Not on file  Utilities: Not on file  Health Literacy: Not on file     Family History: The  patient's family history includes Cancer in his brother, maternal aunt, and sister; Cancer (age of onset: 21) in his mother; Cerebral aneurysm in his sister.  EKGs/Labs/Other Studies Reviewed:    The following studies were reviewed today:  EKG Interpretation Date/Time:  Monday April 11 2024 08:26:42 EST Ventricular Rate:  61 PR Interval:  140 QRS Duration:  88 QT Interval:  432 QTC Calculation: 434 R Axis:   83  Text Interpretation: Normal sinus rhythm Nonspecific ST abnormality When compared with ECG of 12-Mar-2023 14:25, No significant change was found Confirmed by Ashleigh Arya [id number]) on 04/11/2024 8:29:18 AM    Recent Labs: No results found for requested labs within last 365 days.  Recent Lipid Panel No results found for: CHOL, TRIG, HDL, CHOLHDL, VLDL, LDLCALC, LDLDIRECT   Risk Assessment/Calculations:    CHA2DS2-VASc Score = 2   This indicates a 2.2% annual risk of stroke. The patient's score is based upon: CHF History: 0 HTN History: 1 Diabetes History: 0 Stroke History: 0 Vascular Disease History: 0 Age Score: 1 Gender Score: 0                Physical Exam:    VS:  BP 138/84 (BP Location: Left Arm, Patient Position: Sitting, Cuff Size: Large)   Pulse 66   Ht 5' 9 (1.753 m)   Wt 185 lb 6.4 oz (84.1 kg)   SpO2 97%   BMI 27.38 kg/m        Wt Readings from Last 3 Encounters:  04/11/24 185 lb 6.4 oz (84.1 kg)  10/13/23 193 lb 9.6 oz (87.8 kg)  03/12/23 190 lb 3.2 oz (86.3 kg)     GEN:  Well nourished, well developed in no acute distress HEENT: Normal NECK: No carotid bruits CARDIAC:  S1-S2 normal, RRR, no murmurs, rubs, gallops RESPIRATORY:  Clear to auscultation without rales, wheezing or rhonchi  MUSCULOSKELETAL:  No edema; No deformity  SKIN: Warm and dry NEUROLOGIC:  Alert and oriented x 3 PSYCHIATRIC:  Anxious      Assessment & Plan PAF (paroxysmal atrial fibrillation) (HCC) PAT (paroxysmal atrial tachycardia) EKG: 61 bpm, Normal sinus rhythm, Nonspecific ST abnormality, no significant change from prior study He's doing well today. Reports occasional tachy-palpitations lasting anywhere from 10-60 seconds occurring maybe once per week. Denies any longer episodes, not currently interested in any surgical therapies as he feels his A. Fib is well controlled. Exercises multiple times per week without cardiovascular symptoms. Instructed to contact us  if this changes/worsens going forward. Chads2-VASc: 2 Reports warfarin compliance, currently managed w/ his PCP. Discussed possibility of transitioning to Eliquis, patient currently not interested but will reach out if that changes in the future.  Denies bleeding/bruising issues Continue warfarin as titrated by PCP Continue Cardizem  CD 300 mg daily Continue short acting Cardizem  30 mg as needed for fast heartbeat, up to 4 times/day Continue Toprol -XL 25 mg daily Labs 12/01/2023: BUN 12, creatinine 1.10, potassium 4.1, Hgb 16.3, HCT 48.1, PLT 162 Primary hypertension BP reported well-controlled at home Averaging in the 120s-130s systolic, currently being followed by  his PCP Continue Cardizem  CD 300 mg daily Continue lisinopril  10 mg daily  Disposition: Follow up in 1 year or sooner if necessary            Medication Adjustments/Labs and Tests Ordered: Current medicines are reviewed at length with the patient today.  Concerns regarding medicines are outlined above.  Orders Placed This Encounter  Procedures   EKG 12-Lead   No orders of the  defined types were placed in this encounter.   Patient Instructions  Medication Instructions:  NO CHANGES *If you need a refill on your cardiac medications before your next appointment, please call your pharmacy*  Lab Work: NO LABS If you have labs (blood work) drawn today and your tests are completely normal, you will receive your results only by: MyChart Message (if you have MyChart) OR A paper copy in the mail If you have any lab test that is abnormal or we need to change your treatment, we will call you to review the results.  Testing/Procedures: NO TESTING  Follow-Up: At [facility], you and your health needs are our priority.  As part of our continuing mission to provide you with exceptional heart care, our providers are all part of one team.  This team includes your primary Cardiologist (physician) and Advanced Practice Providers or APPs (Physician Assistants and Nurse Practitioners) who all work together to provide you with the care you need, when you need it.  Your next appointment:   1 year(s)  Provider:   Wilbert Bihari, MD   Signed, Carmeron Heady E Keyandre Pileggi, NP  04/11/2024 9:15 AM    [facility]     [1]  Current Meds  Medication Sig   acetaminophen  (TYLENOL ) 325 MG tablet Take 2 tablets (650 mg total) by mouth every 6 (six) hours as needed for mild pain (pain score 1-3 or temp > 100.5). (Patient taking differently: Take 650 mg by mouth every 6 (six) hours as needed for mild pain (pain score 1-3) (pain score 1-3 or temp > 100.5). 1000 due to surgery)   atorvastatin   (LIPITOR) 20 MG tablet Take 20 mg by mouth daily.   busPIRone (BUSPAR) 10 MG tablet Take 10 mg by mouth 2 (two) times daily.   carbidopa -levodopa  (SINEMET  IR) 25-100 MG tablet Take 1 tablet by mouth 3 (three) times daily. 7am/11am/4pm   diltiazem  (CARDIZEM  CD) 300 MG 24 hr capsule TAKE ONE CAPSULE BY MOUTH DAILY   diltiazem  (CARDIZEM ) 30 MG tablet Take 1 tablet (30 mg total) by mouth 4 (four) times daily as needed (fast heart beating).   FLUoxetine (PROZAC) 20 MG capsule Take 1 capsule by mouth daily.   lisinopril  (PRINIVIL ,ZESTRIL ) 10 MG tablet Take 10 mg by mouth daily.   metoprolol  succinate (TOPROL -XL) 25 MG 24 hr tablet Take 1 tablet (25 mg total) by mouth daily.   omeprazole (PRILOSEC) 40 MG capsule Take 40 mg by mouth daily.   Propylene Glycol (SYSTANE COMPLETE) 0.6 % SOLN Place 1 drop into both eyes daily as needed (dry eyes).   tadalafil (CIALIS) 5 MG tablet Take 5 mg by mouth daily as needed.   testosterone  (ANDROGEL ) 50 MG/5GM (1%) GEL Place 5 g onto the skin daily.   testosterone  (ANDROGEL ) 50 MG/5GM (1%) GEL Apply 1 packet to skin once daily in the morning to shoulder, upper abdomen   valACYclovir (VALTREX) 1000 MG tablet Take 1,000 mg by mouth daily.   vitamin B-12 (CYANOCOBALAMIN ) 1000 MCG tablet Take 1,000 mcg by mouth daily.   warfarin (COUMADIN ) 5 MG tablet Take 2.5-5 mg by mouth See admin instructions. Per patient taking 5 mg on Monday's and taking 2.5 mg Tues.,Wed., Thurs., Fri., Sat., and Sun.

## 2024-04-11 ENCOUNTER — Ambulatory Visit: Attending: Physician Assistant | Admitting: Physician Assistant

## 2024-04-11 ENCOUNTER — Encounter: Payer: Self-pay | Admitting: Physician Assistant

## 2024-04-11 VITALS — BP 138/84 | HR 66 | Ht 69.0 in | Wt 185.4 lb

## 2024-04-11 DIAGNOSIS — I4719 Other supraventricular tachycardia: Secondary | ICD-10-CM

## 2024-04-11 DIAGNOSIS — I480 Paroxysmal atrial fibrillation: Secondary | ICD-10-CM | POA: Diagnosis not present

## 2024-04-11 DIAGNOSIS — I10 Essential (primary) hypertension: Secondary | ICD-10-CM | POA: Diagnosis not present

## 2024-04-11 MED ORDER — METOPROLOL SUCCINATE ER 25 MG PO TB24: 25.0000 mg | ORAL_TABLET | Freq: Every day | ORAL | 3 refills | Status: AC

## 2024-04-11 NOTE — Assessment & Plan Note (Addendum)
 EKG: 61 bpm, Normal sinus rhythm, Nonspecific ST abnormality, no significant change from prior study He's doing well today. Reports occasional tachy-palpitations lasting anywhere from 10-60 seconds occurring maybe once per week. Denies any longer episodes, not currently interested in any surgical therapies as he feels his A. Fib is well controlled. Exercises multiple times per week without cardiovascular symptoms. Instructed to contact us  if this changes/worsens going forward. Chads2-VASc: 2 Reports warfarin compliance, currently managed w/ his PCP. Discussed possibility of transitioning to Eliquis, patient currently not interested but will reach out if that changes in the future.  Denies bleeding/bruising issues Continue warfarin as titrated by PCP Continue Cardizem  CD 300 mg daily Continue short acting Cardizem  30 mg as needed for fast heartbeat, up to 4 times/day Continue Toprol -XL 25 mg daily Labs 12/01/2023: BUN 12, creatinine 1.10, potassium 4.1, Hgb 16.3, HCT 48.1, PLT 162

## 2024-04-11 NOTE — Patient Instructions (Signed)
 Medication Instructions:  NO CHANGES *If you need a refill on your cardiac medications before your next appointment, please call your pharmacy*  Lab Work: NO LABS If you have labs (blood work) drawn today and your tests are completely normal, you will receive your results only by: MyChart Message (if you have MyChart) OR A paper copy in the mail If you have any lab test that is abnormal or we need to change your treatment, we will call you to review the results.  Testing/Procedures: NO TESTING  Follow-Up: At [facility], you and your health needs are our priority.  As part of our continuing mission to provide you with exceptional heart care, our providers are all part of one team.  This team includes your primary Cardiologist (physician) and Advanced Practice Providers or APPs (Physician Assistants and Nurse Practitioners) who all work together to provide you with the care you need, when you need it.  Your next appointment:   1 year(s)  Provider:   Gaylyn Keas, MD

## 2024-04-11 NOTE — Assessment & Plan Note (Addendum)
 BP reported well-controlled at home Averaging in the 120s-130s systolic, currently being followed by his PCP Continue Cardizem  CD 300 mg daily Continue lisinopril  10 mg daily

## 2024-04-13 DIAGNOSIS — Z7901 Long term (current) use of anticoagulants: Secondary | ICD-10-CM | POA: Diagnosis not present

## 2024-04-20 ENCOUNTER — Other Ambulatory Visit: Payer: Self-pay | Admitting: Cardiology

## 2024-05-04 ENCOUNTER — Other Ambulatory Visit: Payer: Self-pay

## 2024-05-04 ENCOUNTER — Encounter: Payer: Self-pay | Admitting: Pharmacy Technician

## 2024-05-05 ENCOUNTER — Other Ambulatory Visit: Payer: Self-pay

## 2024-05-05 ENCOUNTER — Other Ambulatory Visit (HOSPITAL_COMMUNITY): Payer: Self-pay

## 2024-05-06 ENCOUNTER — Other Ambulatory Visit: Payer: Self-pay

## 2024-05-06 ENCOUNTER — Other Ambulatory Visit (HOSPITAL_COMMUNITY): Payer: Self-pay

## 2024-06-02 ENCOUNTER — Other Ambulatory Visit (HOSPITAL_COMMUNITY): Payer: Self-pay

## 2024-10-04 ENCOUNTER — Ambulatory Visit: Admitting: Neurology
